# Patient Record
Sex: Male | Born: 1940 | Race: White | Hispanic: No | Marital: Married | Smoking: Former smoker
Health system: Southern US, Community
[De-identification: ages and names within clinical notes are randomized; demographics above are authoritative.]

## PROBLEM LIST (undated history)

## (undated) DIAGNOSIS — N62 Hypertrophy of breast: Secondary | ICD-10-CM

## (undated) DIAGNOSIS — K635 Polyp of colon: Secondary | ICD-10-CM

## (undated) DIAGNOSIS — N529 Male erectile dysfunction, unspecified: Secondary | ICD-10-CM

## (undated) DIAGNOSIS — F329 Major depressive disorder, single episode, unspecified: Secondary | ICD-10-CM

## (undated) DIAGNOSIS — E7800 Pure hypercholesterolemia, unspecified: Secondary | ICD-10-CM

## (undated) DIAGNOSIS — M549 Dorsalgia, unspecified: Secondary | ICD-10-CM

## (undated) DIAGNOSIS — F32A Depression, unspecified: Secondary | ICD-10-CM

## (undated) DIAGNOSIS — E0590 Thyrotoxicosis, unspecified without thyrotoxic crisis or storm: Secondary | ICD-10-CM

## (undated) DIAGNOSIS — I2510 Atherosclerotic heart disease of native coronary artery without angina pectoris: Secondary | ICD-10-CM

## (undated) DIAGNOSIS — L219 Seborrheic dermatitis, unspecified: Secondary | ICD-10-CM

## (undated) DIAGNOSIS — M48061 Spinal stenosis, lumbar region without neurogenic claudication: Secondary | ICD-10-CM

## (undated) DIAGNOSIS — I00 Rheumatic fever without heart involvement: Secondary | ICD-10-CM

## (undated) DIAGNOSIS — K429 Umbilical hernia without obstruction or gangrene: Secondary | ICD-10-CM

## (undated) HISTORY — DX: Rheumatic fever without heart involvement: I00

## (undated) HISTORY — DX: Polyp of colon: K63.5

## (undated) HISTORY — DX: Dorsalgia, unspecified: M54.9

## (undated) HISTORY — DX: Pure hypercholesterolemia, unspecified: E78.00

## (undated) HISTORY — DX: Depression, unspecified: F32.A

## (undated) HISTORY — DX: Major depressive disorder, single episode, unspecified: F32.9

## (undated) HISTORY — PX: CARDIAC CATHETERIZATION: SHX172

## (undated) HISTORY — DX: Hypertrophy of breast: N62

## (undated) HISTORY — DX: Male erectile dysfunction, unspecified: N52.9

## (undated) HISTORY — DX: Umbilical hernia without obstruction or gangrene: K42.9

## (undated) HISTORY — DX: Spinal stenosis, lumbar region without neurogenic claudication: M48.061

## (undated) HISTORY — DX: Seborrheic dermatitis, unspecified: L21.9

## (undated) HISTORY — DX: Thyrotoxicosis, unspecified without thyrotoxic crisis or storm: E05.90

## (undated) HISTORY — PX: CORONARY STENT PLACEMENT: SHX1402

---

## 2000-05-26 ENCOUNTER — Encounter: Admission: RE | Admit: 2000-05-26 | Discharge: 2000-05-26 | Payer: Self-pay | Admitting: Geriatric Medicine

## 2000-05-26 ENCOUNTER — Encounter: Payer: Self-pay | Admitting: Geriatric Medicine

## 2013-01-04 ENCOUNTER — Ambulatory Visit (HOSPITAL_COMMUNITY): Payer: Medicare HMO | Attending: Internal Medicine

## 2013-01-04 DIAGNOSIS — I10 Essential (primary) hypertension: Secondary | ICD-10-CM | POA: Insufficient documentation

## 2013-01-04 DIAGNOSIS — I2510 Atherosclerotic heart disease of native coronary artery without angina pectoris: Secondary | ICD-10-CM | POA: Insufficient documentation

## 2013-01-04 DIAGNOSIS — Z136 Encounter for screening for cardiovascular disorders: Secondary | ICD-10-CM

## 2013-01-04 DIAGNOSIS — F17200 Nicotine dependence, unspecified, uncomplicated: Secondary | ICD-10-CM | POA: Diagnosis not present

## 2013-01-26 ENCOUNTER — Other Ambulatory Visit: Payer: Self-pay | Admitting: Nurse Practitioner

## 2013-01-26 ENCOUNTER — Ambulatory Visit
Admission: RE | Admit: 2013-01-26 | Discharge: 2013-01-26 | Disposition: A | Discharge status: Home / Self Care | Payer: Commercial Managed Care - HMO | Source: Ambulatory Visit | Attending: Nurse Practitioner | Admitting: Nurse Practitioner

## 2013-01-26 DIAGNOSIS — R079 Chest pain, unspecified: Secondary | ICD-10-CM

## 2013-04-19 ENCOUNTER — Other Ambulatory Visit: Payer: Self-pay | Admitting: Internal Medicine

## 2013-04-19 DIAGNOSIS — R2689 Other abnormalities of gait and mobility: Secondary | ICD-10-CM

## 2013-04-19 DIAGNOSIS — R269 Unspecified abnormalities of gait and mobility: Secondary | ICD-10-CM

## 2013-04-25 ENCOUNTER — Other Ambulatory Visit: Payer: Medicare HMO

## 2013-05-01 ENCOUNTER — Ambulatory Visit
Admission: RE | Admit: 2013-05-01 | Discharge: 2013-05-01 | Disposition: A | Discharge status: Home / Self Care | Payer: Medicare HMO | Source: Ambulatory Visit | Attending: Internal Medicine | Admitting: Internal Medicine

## 2013-05-01 ENCOUNTER — Ambulatory Visit
Admission: RE | Admit: 2013-05-01 | Discharge: 2013-05-01 | Disposition: A | Discharge status: Home / Self Care | Payer: Commercial Managed Care - HMO | Source: Ambulatory Visit | Attending: Internal Medicine | Admitting: Internal Medicine

## 2013-05-01 DIAGNOSIS — R269 Unspecified abnormalities of gait and mobility: Secondary | ICD-10-CM

## 2013-05-01 DIAGNOSIS — R2689 Other abnormalities of gait and mobility: Secondary | ICD-10-CM

## 2013-05-01 MED ORDER — GADOBENATE DIMEGLUMINE 529 MG/ML IV SOLN
20.0000 mL | Freq: Once | INTRAVENOUS | Status: AC | PRN
  Administered 2013-05-01: 20 mL via INTRAVENOUS

## 2013-06-22 ENCOUNTER — Encounter: Payer: Self-pay | Admitting: Neurology

## 2013-06-22 ENCOUNTER — Ambulatory Visit (INDEPENDENT_AMBULATORY_CARE_PROVIDER_SITE_OTHER): Payer: Medicare HMO | Admitting: Neurology

## 2013-06-22 VITALS — BP 124/90 | HR 90 | Ht 69.69 in | Wt 236.5 lb

## 2013-06-22 DIAGNOSIS — R42 Dizziness and giddiness: Secondary | ICD-10-CM

## 2013-06-22 DIAGNOSIS — G609 Hereditary and idiopathic neuropathy, unspecified: Secondary | ICD-10-CM

## 2013-06-22 NOTE — Patient Instructions (Addendum)
1.  Your provider has requested that you have labwork completed today. Please go to [facility] on the first floor of this building before leaving the office today. We would like you to have a two hour glucose tolerance test. This test is performed at [facility] located at [facility] - basement level. Please call [phone] to arrange a convenient time to have testing completed.  2.  Start using knee high compression stocking 3.  Fall precautions discussed 4.  Encouraged to use cane 5.  Encouraged to stop smoking 6.  Return to clinic in 38-months  [facility]  Preventing Falls in the Home   Falls are common, often dreaded events in the lives of older people. Aside from the obvious injuries and even death that may result, falls can cause wide-ranging consequences including loss of independence, mental decline, decreased activity, and mobility. Younger people are also at risk of falling, especially those with chronic illnesses and fatigue.  Ways to reduce the risk for falling:  * Examine diet and medications. Warm foods and alcohol dilate blood vessels, which can lead to dizziness when standing. Sleep aids, antidepressants, and pain medications can also increase the likelihood of a fall.  * Get a vison exam. Poor vision, cataracts, and glaucoma increase the chances of falling.  * Check foot gear. Shoes should fit snugly and have a sturdy, nonskid sole and broad, low heel.  * Participate in a physician-approved exercise program to build and maintain muscle strength and improve balance and coordination.  * Increase vitamin D intake. Vitamin D improves muscle strength and increases the amount of calcium the body is able to absorb and deposit in bones.  How to prevent falls from common hazards:  * Floors - Remove all loose wires, cords, and throw rugs. Minimize clutter. Make sure rugs are anchored and smooth. Keep furniture in its usual place.  * Chairs - Use chairs with  straight backs, armrests, and firm seats. Add firm cushions to existing pieces to add height.  * Bathroom - Install grab bars and non-skid tape in the tub or shower. Use a bathtub transfer bench or a shower chair with a back support. Use an elevated toilet seat and/or safety rails to assist standing from a low surface. Do not use towel racks or bathroom tissue holders to help you stand.  * Lighting - Make sure halls, stairways, and entrances are well-lit. Install a night light in your bathroom or hallway. Make sure there is a light switch at the top and bottom of the staircase. Turn lights on if you get up in the middle of the night. Make sure lamps or light switches are within reach of the bed if you have to get up during the night.  * Kitchen - Install non-skid rubber mats near the sink and stove. Clean spills immediately. Store frequently used utensils, pots, and pans between waist and eye level. This helps prevent reaching and bending. Sit when getting things out of the lower cupboards.  * Living room / Bedrooms - Place furniture with wide spaces in between, giving enough room to move around. Establish a route through the living room that gives you something to hold onto as you walk.  * Stairs - Make sure treads, rails, and rugs are secure. Install a rail on both sides of the stairs. If stairs are a threat, it might be helpful to arrange most of your activities on the lower level to reduce the number of  times you must climb the stairs.  * Entrances and doorways - Install metal handles on the walls adjacent to the doorknobs of all doors to make it more secure as you travel through the doorway.  Tips for maintaining balance:  * Keep at least one hand free at all times Try using a backpack or fanny pack to hold things rather than carrying them in your hands. Never carry objects in both hands when walking as this interferes with keeping your balance.  * Attempt to swing both arms from front to back while  walking. This might require a conscious effort if Parkinson's disease has diminished your movement. It will, however, help you to maintain balance and posture, and reduce fatigue.  * Consciously lift your feet off the ground when walking. Shuffling and dragging of the feet is a common culprit in losing your balance.  * When trying to navigate turns, use a "U" technique of facing forward and making a wide turn, rather than pivoting sharply.  * Try to stand with your feet shoulder-length apart. When your feet are close together for any length of time, you increase your risk of losing your balance and falling.  * Do one thing at a time. Do not try to walk and accomplish another task, such as reading or looking around. The decrease in your automatic reflexes complicates motor function, so the less distraction, the better.  * Do not wear rubber or gripping soled shoes, they might "catch" on the floor and cause tripping.  * Move slowly when changing positions. Use deliberate, concentrated movements and, if needed, use a grab bar or walking aid. Count fifteen (15) seconds after standing to begin walking.  * If balance is a continuous problem, you might want to consider a walking aid such as a cane, walking stick, or walker. Once you have mastered walking with help, you may be ready to try it again on your own.  This information is provided by [facility] and is not intended to replace the medical advice of your physician or other health care providers. Please consult your physician or other health care providers for advice regarding your specific medical condition.

## 2013-06-22 NOTE — Progress Notes (Signed)
[facility] Clinic Note - Initial Visit   Date: 06/22/2013    Jordan Hebert MRN: [id number] DOB: [birth year]   Dear Dr Valentina Lucks:  Thank you for your kind referral of Jordan Hebert for consultation of gait abnormality. Although his history is well known to you, please allow [place] to reiterate it for the purpose of our medical record. The patient was accompanied to the clinic by self.    History of Present Illness: Jordan Hebert is a 73 y.o. right-handed Caucasian male with history of hypothyroidism, CAD s/p PCI/stent (2002) and hyperlipidemia presenting for evaluation of gait abnormality.  Starting around fall 2014, he developed constant numbness involving the soles of the feet and had two falls: (1) [place] and tripped and bruised the left side of his abdomen/chest and (2) occurred in his daughter's yard on grass.  He was able to get up himself with each fall.  He denies any preceding lightheadedness, changes in vision, or palpitations. He denies any weakness or tingling.  He is walking unassisted. If he gets up too quickly out of bed, from a chair, or makes any abrupt movements, he develops lightheadedness.  Denies dry eyes, early satiety, or lack of sweating.  He has mild dry mouth.   He saw his PCP, Dr. Valentina Lucks, who ordered MRI/A brain which was normal and referred him for further evaluation.   He tells me that his son died of Guillain-Barr syndrome at the age of 44 and 2012. He was hospitalized in [place] for 3 months, returned home and was doing outpatient therapy, and the first day after he started walking he died. He suspects it was due to a blood clot. His sister also died of a ruptured cerebral aneurysm.  Out-side paper records, electronic medical record, and images have been reviewed where available and summarized as:  Labs 05/07/13: B12 515, Cr 1.02  Past Medical History  Diagnosis Date  . Hyperthyroidism   . High  cholesterol   . Gynecomastia   . Back pain   . Erectile dysfunction   . Depression   . Rheumatic fever   . Umbilical hernia   . Seborrhea   . Colon polyps   . Degenerative lumbar spinal stenosis     Past Surgical History  Procedure Laterality Date  . Coronary stent placement       Medications:  Aspirin 81 mg daily Lipitor 40 mg daily Flexeril 10 mg as needed Synthroid 175 mcg daily Vitamins B12 1000 mcg daily  Allergies: No Known Allergies  Family History: Family History  Problem Relation Age of Onset  . Stroke Mother   . Cancer Brother   . Aneurysm Sister     Cerebral   . Other Son     Guillain-Barre Syndrome    Social History: History   Social History  . Marital Status: Married    Spouse Name: N/A    Number of Children: 2  . Years of Education: N/A   Occupational History  . retired    Social History Main Topics  . Smoking status: Current Every Day Smoker  . Smokeless tobacco: Never Used  . Alcohol Use: Yes  . Drug Use: Not on file  . Sexual Activity: Not on file   Other Topics Concern  . Not on file   Social History Narrative   He lives with wife.    He is retired from [employer] applications)  Review of Systems:  CONSTITUTIONAL: No fevers, chills, night sweats, or weight loss.   EYES: No visual changes or eye pain ENT: No hearing changes.  No history of nose bleeds.   RESPIRATORY: No cough, wheezing and shortness of breath.   CARDIOVASCULAR: Negative for chest pain, and palpitations.   GI: Negative for abdominal discomfort, blood in stools or black stools.  No recent change in bowel habits.   GU:  No history of incontinence.   MUSCLOSKELETAL: No history of joint pain or swelling.  No myalgias.   SKIN: Negative for lesions, rash, and itching.   HEMATOLOGY/ONCOLOGY: Negative for prolonged bleeding, bruising easily, and swollen nodes.  No history of cancer.   ENDOCRINE: Negative for cold or heat intolerance, polydipsia  or goiter.   PSYCH:  No depression or anxiety symptoms.   NEURO: As Above.   Vital Signs:  BP 124/90  Pulse 90  Ht 5' 9.69" (1.77 m)  Wt 236 lb 8 oz (107.276 kg)  BMI 34.24 kg/m2  SpO2 94%  Neurological Exam: MENTAL STATUS including orientation to time, place, person, recent and remote memory, attention span and concentration, language, and fund of knowledge is normal.  Speech is not dysarthric.  CRANIAL NERVES: II:  No visual field defects.  Unremarkable fundi.   III-IV-VI: Pupils equal round and reactive to light.  Normal conjugate, extra-ocular eye movements in all directions of gaze.  No nystagmus.  No ptosis.   V:  Normal facial sensation.   VII:  Normal facial symmetry and movements.  No pathologic facial reflexes.  VIII:  Normal hearing and vestibular function.   IX-X:  Normal palatal movement.   XI:  Normal shoulder shrug and head rotation.   XII:  Normal tongue strength and range of motion, no deviation or fasciculation.  MOTOR:  No atrophy, fasciculations or abnormal movements.  No pronator drift.  Tone is normal.    Right Upper Extremity:    Left Upper Extremity:    Deltoid  5/5   Deltoid  5/5   Biceps  5/5   Biceps  5/5   Triceps  5/5   Triceps  5/5   Wrist extensors  5/5   Wrist extensors  5/5   Wrist flexors  5/5   Wrist flexors  5/5   Finger extensors  5/5   Finger extensors  5/5   Finger flexors  5/5   Finger flexors  5/5   Dorsal interossei  5/5   Dorsal interossei  5/5   Abductor pollicis  5/5   Abductor pollicis  5/5   Tone (Ashworth scale)  0  Tone (Ashworth scale)  0   Right Lower Extremity:    Left Lower Extremity:    Hip flexors  5/5   Hip flexors  5/5   Hip extensors  5/5   Hip extensors  5/5   Knee flexors  5/5   Knee flexors  5/5   Knee extensors  5/5   Knee extensors  5/5   Dorsiflexors  5/5   Dorsiflexors  5/5   Plantarflexors  5/5   Plantarflexors  5/5   Toe extensors  5/5   Toe extensors  5/5   Toe flexors  5/5   Toe flexors  5/5   Tone  (Ashworth scale)  0  Tone (Ashworth scale)  0   MSRs:  Right  Left brachioradialis 2+  brachioradialis 2+  biceps 2+  biceps 2+  triceps 2+  triceps 2+  patellar 3+  patellar 3+  ankle jerk 0  ankle jerk 0  Hoffman no  Hoffman no  plantar response down  plantar response down   SENSORY:  Vibration reduced to 60% at knees, 30% at ankles and great toe bilaterally.  Temperature and pin prick reduced distal to mid-calf bilaterally.  Romberg's sign present.   COORDINATION/GAIT: Normal finger-to- nose-finger and heel-to-shin.  Intact rapid alternating movements bilaterally.  Able to rise from a chair without using arms.  Slightly wide-based unsteady gait.  He is unable to tandem and stressed gait intact.    IMPRESSION: Jordan Hebert is a 73 year-old gentleman presenting for evaluation of gait abnormality and numbness involving the soles of his feet. 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   I will check for potentially treatable causes of neuropathy. His B12 and thyroid function is within normal limits. I have discussed the pathophysiology, etiology, management options, and natural course of neuropathy with the patient. I offered him the opportunity to ask questions and answered them to the best of my ability. I discussed doing balance training and gait therapy, however patient would like to try compression stockings first. No need for EMG given typical history and exam findings.   PLAN/RECOMMENDATIONS:  1.  Check 2hr glucose tolerance, copper, SPEP/UPEP with IFE 2.  Start using knee high compression stocking 30mmHg 3.  US carotids  4.  Fall precautions discussed, specifically making positional changes very slowly and steadily. 5.  Encouraged to use cane 6.   Return to clinic in 213-months   The duration of this appointment visit was 50 minutes of face-to-face time with the patient.  Greater than 50% of this time was spent in counseling, explanation of diagnosis, planning of further management, and coordination of care.   Thank you for allowing me to participate in patient's care.  If I can answer any additional questions, I would be pleased to do so.    Sincerely,    Donika K. Allena KatzPatel, DO

## 2013-06-25 ENCOUNTER — Other Ambulatory Visit: Payer: Self-pay | Admitting: *Deleted

## 2013-06-25 DIAGNOSIS — R269 Unspecified abnormalities of gait and mobility: Secondary | ICD-10-CM

## 2013-06-25 LAB — COPPER, SERUM: Copper: 98 ug/dL (ref 70–175)

## 2013-06-25 NOTE — Progress Notes (Signed)
Faxed note and LM for pt to call me about appt time and date.

## 2013-06-26 LAB — UIFE/LIGHT CHAINS/TP QN, 24-HR UR
Albumin, U: DETECTED
FREE LAMBDA LT CHAINS, UR: 0.12 mg/dL (ref 0.02–0.67)
Free Kappa Lt Chains,Ur: 1.22 mg/dL (ref 0.14–2.42)
Free Kappa/Lambda Ratio: 10.17 ratio (ref 2.04–10.37)
Total Protein, Urine: 1.9 mg/dL

## 2013-06-26 LAB — SPEP & IFE WITH QIG
Albumin ELP: 53.8 % — ABNORMAL LOW (ref 55.8–66.1)
Alpha-1-Globulin: 3.5 % (ref 2.9–4.9)
Alpha-2-Globulin: 12.4 % — ABNORMAL HIGH (ref 7.1–11.8)
Beta 2: 5.6 % (ref 3.2–6.5)
Beta Globulin: 6.7 % (ref 4.7–7.2)
GAMMA GLOBULIN: 18 % (ref 11.1–18.8)
IGG (IMMUNOGLOBIN G), SERUM: 1270 mg/dL (ref 650–1600)
IGM, SERUM: 49 mg/dL (ref 41–251)
IgA: 281 mg/dL (ref 68–379)
TOTAL PROTEIN, SERUM ELECTROPHOR: 7.2 g/dL (ref 6.0–8.3)

## 2013-06-26 NOTE — Progress Notes (Signed)
Pt notified of appt time and date.  April 2 at 11:00.

## 2013-06-27 ENCOUNTER — Other Ambulatory Visit: Payer: Self-pay

## 2013-06-27 DIAGNOSIS — R079 Chest pain, unspecified: Secondary | ICD-10-CM

## 2013-06-28 ENCOUNTER — Ambulatory Visit (HOSPITAL_COMMUNITY)
Admission: RE | Admit: 2013-06-28 | Discharge: 2013-06-28 | Disposition: A | Discharge status: Home / Self Care | Payer: Medicare HMO | Source: Ambulatory Visit | Attending: Neurology | Admitting: Neurology

## 2013-06-28 DIAGNOSIS — R269 Unspecified abnormalities of gait and mobility: Secondary | ICD-10-CM | POA: Insufficient documentation

## 2013-06-28 DIAGNOSIS — E785 Hyperlipidemia, unspecified: Secondary | ICD-10-CM | POA: Diagnosis not present

## 2013-06-28 DIAGNOSIS — F17200 Nicotine dependence, unspecified, uncomplicated: Secondary | ICD-10-CM | POA: Diagnosis not present

## 2013-06-28 NOTE — Progress Notes (Signed)
VASCULAR LAB PRELIMINARY  PRELIMINARY  PRELIMINARY  PRELIMINARY  Carotid duplex completed.    Preliminary report:  Bilateral:  1-39% ICA stenosis.  Vertebral artery flow is antegrade.     Claron Rosencrans, RVS 06/28/2013, 3:44 PM

## 2013-07-20 ENCOUNTER — Telehealth: Payer: Self-pay | Admitting: Neurology

## 2013-07-20 ENCOUNTER — Other Ambulatory Visit: Payer: Self-pay | Admitting: *Deleted

## 2013-07-20 DIAGNOSIS — G609 Hereditary and idiopathic neuropathy, unspecified: Secondary | ICD-10-CM

## 2013-07-20 NOTE — Telephone Encounter (Signed)
LM for patient to call me back. 

## 2013-07-20 NOTE — Telephone Encounter (Signed)
Patient notified that his lab is on May 26 at 7:30 am.  Instructed him to be fasting for 8 hours prior.  Patient agreed.

## 2013-07-20 NOTE — Telephone Encounter (Signed)
[phone] needs to know where to go to take a diabetic test

## 2013-08-21 ENCOUNTER — Other Ambulatory Visit: Payer: Commercial Managed Care - HMO

## 2013-08-21 DIAGNOSIS — G609 Hereditary and idiopathic neuropathy, unspecified: Secondary | ICD-10-CM

## 2013-08-21 LAB — GLUCOSE TOLERANCE, 2 HOURS
GLUCOSE 1 HOUR GTT: 213 mg/dL
GLUCOSE, FASTING: 128 mg/dL — AB (ref 70–99)
Glucose, 2 hour: 144 mg/dL

## 2013-09-25 ENCOUNTER — Ambulatory Visit: Payer: Medicare HMO | Admitting: Neurology

## 2013-09-26 ENCOUNTER — Telehealth: Payer: Self-pay | Admitting: Neurology

## 2013-09-26 NOTE — Telephone Encounter (Signed)
Pt no showed 09/25/13 3 month follow up appt w/ Dr. Allena KatzPatel. No show letter mailed to pt / Sherri S.

## 2013-12-12 ENCOUNTER — Ambulatory Visit
Admission: RE | Admit: 2013-12-12 | Discharge: 2013-12-12 | Disposition: A | Discharge status: Home / Self Care | Payer: Commercial Managed Care - HMO | Source: Ambulatory Visit | Attending: Internal Medicine | Admitting: Internal Medicine

## 2013-12-12 ENCOUNTER — Other Ambulatory Visit: Payer: Self-pay | Admitting: Internal Medicine

## 2013-12-12 DIAGNOSIS — M5489 Other dorsalgia: Secondary | ICD-10-CM

## 2014-09-20 ENCOUNTER — Other Ambulatory Visit: Payer: Self-pay | Admitting: *Deleted

## 2014-09-20 DIAGNOSIS — R200 Anesthesia of skin: Secondary | ICD-10-CM

## 2014-10-08 ENCOUNTER — Ambulatory Visit (INDEPENDENT_AMBULATORY_CARE_PROVIDER_SITE_OTHER): Payer: Commercial Managed Care - HMO | Admitting: Neurology

## 2014-10-08 DIAGNOSIS — G609 Hereditary and idiopathic neuropathy, unspecified: Secondary | ICD-10-CM

## 2014-10-08 DIAGNOSIS — R200 Anesthesia of skin: Secondary | ICD-10-CM

## 2014-10-08 NOTE — Procedures (Signed)
[facility]  [address], [address]  [place], [place] [address] Tel: [phone] Fax:  [phone] Test Date:  10/08/2014  Patient: Jordan DingwallRobert Hebert DOB: 01/27/1941 Physician: Nita Sickleonika Sonya Pucci, DO  Sex: Male Height: 5\' 10"  Ref Phys: Thora LanceJohn J.Griffin, M.D.  ID#: [id number] Temp: 33.6C Technician: Judie PetitM. Dean   Patient Complaints: This is a [age] year old gentleman presenting for evaluation of bilateral feet paresthesias.   NCV & EMG Findings: Extensive electrodiagnostic testing of the right lower extremity and additional studies of the left shows: 1. Bilateral sural and superficial peroneal sensory responses are absent. 2. Bilateral tibial and motor responses are within normal limits. Of note, proximal tibial motor response on the left is reduced and most likely technical in nature. 3. Chronic motor axon loss changes are seen in the muscles below the knees bilaterally, without accompanied active denervation.  Impression: 1. 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 Overall, these findings are moderate in degree electrically. 2. There is no evidence of a superimposed lumbosacral radiculopathy affecting the legs.   ___________________________ Nita Sickleonika [name], DO    Nerve Conduction Studies Anti Sensory Summary Table   Site NR Peak (ms) Norm Peak (ms) P-T Amp (V) Norm P-T Amp  Left Sup Peroneal Anti Sensory (Ant Lat Mall)  12 cm NR  <4.6  >3  Right Sup Peroneal Anti Sensory (Ant Lat Mall)  12 cm NR  <4.6  >3  Left Sural Anti Sensory (Lat Mall)  Calf NR  <4.6  >3  Right Sural Anti Sensory (Lat Mall)  Calf NR  <4.6  >3   Motor Summary Table   Site NR Onset (ms) Norm Onset (ms) O-P Amp (mV) Norm O-P Amp Site1 Site2 Delta-0 (ms) Dist (cm) Vel (m/s) Norm Vel (m/s)  Left Peroneal Motor (Ext Dig Brev)  Ankle    3.4 <6.0 3.2 >2.5 B Fib Ankle 7.9 32.0 41 >40  B Fib    11.3  2.8  Poplt B Fib 2.5  10.0 40 >40  Poplt    13.8  2.9         Right Peroneal Motor (Ext Dig Brev)  Ankle    3.4 <6.0 2.6 >2.5 B Fib Ankle 7.7 34.0 44 >40  B Fib    11.1  2.4  Poplt B Fib 2.3 10.0 43 >40  Poplt    13.4  2.4         Left Tibial Motor (Abd Hall Brev)    Multiple attempts  Ankle    3.9 <6.0 4.9 >4 Knee Ankle 9.9 41.0 41 >40  Knee    13.8  1.8         Right Tibial Motor (Abd Hall Brev)  Ankle    3.5 <6.0 5.0 >4 Knee Ankle 10.2 44.0 43 >40  Knee    13.7  4.0          EMG   Side Muscle Ins Act Fibs Psw Fasc Number Recrt Dur Dur. Amp Amp. Poly Poly. Comment  Right AntTibialis Nml Nml Nml Nml 1- Mod-R Some 1+ Some 1+ Nml Nml N/A  Right Gastroc Nml Nml Nml Nml 1- Mod-R Some 1+ Some 1+ Nml Nml N/A  Right Flex Dig Long Nml Nml Nml Nml 1- Mod-R Some 1+ Some 1+ Nml Nml N/A  Right RectFemoris Nml Nml Nml Nml Nml Nml Nml Nml Nml Nml Nml Nml N/A  Right GluteusMed Nml Nml Nml Nml Nml Nml Nml Nml Nml Nml Nml  Nml N/A  Left AntTibialis Nml Nml Nml Nml 1- Mod-R Some 1+ Some 1+ Nml Nml N/A  Left Gastroc Nml Nml Nml Nml 1- Mod-R Some 1+ Some 1+ Nml Nml N/A  Left RectFemoris Nml Nml Nml Nml Nml Nml Nml Nml Nml Nml Nml Nml N/A  Left BicepsFemS Nml Nml Nml Nml Nml Nml Nml Nml Nml Nml Nml Nml N/A      Waveforms:

## 2014-10-21 ENCOUNTER — Other Ambulatory Visit: Payer: Self-pay | Admitting: Internal Medicine

## 2014-10-21 DIAGNOSIS — M5412 Radiculopathy, cervical region: Secondary | ICD-10-CM

## 2014-10-23 ENCOUNTER — Ambulatory Visit
Admission: RE | Admit: 2014-10-23 | Discharge: 2014-10-23 | Disposition: A | Discharge status: Home / Self Care | Payer: Commercial Managed Care - HMO | Source: Ambulatory Visit | Attending: Internal Medicine | Admitting: Internal Medicine

## 2014-10-23 DIAGNOSIS — M5412 Radiculopathy, cervical region: Secondary | ICD-10-CM

## 2014-12-31 ENCOUNTER — Other Ambulatory Visit: Payer: Self-pay | Admitting: Internal Medicine

## 2014-12-31 DIAGNOSIS — M5136 Other intervertebral disc degeneration, lumbar region: Secondary | ICD-10-CM

## 2014-12-31 DIAGNOSIS — M545 Low back pain: Secondary | ICD-10-CM

## 2015-01-14 ENCOUNTER — Ambulatory Visit
Admission: RE | Admit: 2015-01-14 | Discharge: 2015-01-14 | Disposition: A | Discharge status: Home / Self Care | Payer: Commercial Managed Care - HMO | Source: Ambulatory Visit | Attending: Internal Medicine | Admitting: Internal Medicine

## 2015-01-14 DIAGNOSIS — M545 Low back pain: Secondary | ICD-10-CM

## 2015-01-14 DIAGNOSIS — M5136 Other intervertebral disc degeneration, lumbar region: Secondary | ICD-10-CM

## 2015-02-06 ENCOUNTER — Other Ambulatory Visit: Payer: Self-pay | Admitting: Neurosurgery

## 2015-02-06 DIAGNOSIS — M4726 Other spondylosis with radiculopathy, lumbar region: Secondary | ICD-10-CM

## 2015-02-12 ENCOUNTER — Ambulatory Visit
Admission: RE | Admit: 2015-02-12 | Discharge: 2015-02-12 | Disposition: A | Discharge status: Home / Self Care | Payer: Commercial Managed Care - HMO | Source: Ambulatory Visit | Attending: Neurosurgery | Admitting: Neurosurgery

## 2015-02-12 DIAGNOSIS — M4726 Other spondylosis with radiculopathy, lumbar region: Secondary | ICD-10-CM

## 2015-02-12 MED ORDER — IOHEXOL 180 MG/ML  SOLN
1.0000 mL | Freq: Once | INTRAMUSCULAR | Status: DC | PRN
Start: 2015-02-12 — End: 2015-02-13
  Administered 2015-02-12: 1 mL via EPIDURAL

## 2015-02-12 MED ORDER — METHYLPREDNISOLONE ACETATE 40 MG/ML INJ SUSP (RADIOLOG
120.0000 mg | Freq: Once | INTRAMUSCULAR | Status: AC
  Administered 2015-02-12: 120 mg via EPIDURAL

## 2015-02-12 NOTE — Discharge Instructions (Signed)

## 2015-03-21 ENCOUNTER — Other Ambulatory Visit: Payer: Self-pay | Admitting: Neurosurgery

## 2015-03-21 DIAGNOSIS — M4726 Other spondylosis with radiculopathy, lumbar region: Secondary | ICD-10-CM

## 2015-04-01 ENCOUNTER — Ambulatory Visit
Admission: RE | Admit: 2015-04-01 | Discharge: 2015-04-01 | Disposition: A | Discharge status: Home / Self Care | Payer: Commercial Managed Care - HMO | Source: Ambulatory Visit | Attending: Neurosurgery | Admitting: Neurosurgery

## 2015-04-01 DIAGNOSIS — M4726 Other spondylosis with radiculopathy, lumbar region: Secondary | ICD-10-CM

## 2015-04-01 MED ORDER — METHYLPREDNISOLONE ACETATE 40 MG/ML INJ SUSP (RADIOLOG
120.0000 mg | Freq: Once | INTRAMUSCULAR | Status: AC
  Administered 2015-04-01: 120 mg via EPIDURAL

## 2015-04-01 MED ORDER — IOHEXOL 180 MG/ML  SOLN
1.0000 mL | Freq: Once | INTRAMUSCULAR | Status: AC | PRN
  Administered 2015-04-01: 1 mL via EPIDURAL

## 2015-05-19 ENCOUNTER — Other Ambulatory Visit: Payer: Self-pay | Admitting: Neurosurgery

## 2015-05-19 DIAGNOSIS — M4726 Other spondylosis with radiculopathy, lumbar region: Secondary | ICD-10-CM

## 2015-05-22 ENCOUNTER — Ambulatory Visit
Admission: RE | Admit: 2015-05-22 | Discharge: 2015-05-22 | Disposition: A | Discharge status: Home / Self Care | Payer: Commercial Managed Care - HMO | Source: Ambulatory Visit | Attending: Neurosurgery | Admitting: Neurosurgery

## 2015-05-22 ENCOUNTER — Other Ambulatory Visit: Payer: Self-pay | Admitting: Neurosurgery

## 2015-05-22 DIAGNOSIS — M4726 Other spondylosis with radiculopathy, lumbar region: Secondary | ICD-10-CM

## 2015-05-22 MED ORDER — METHYLPREDNISOLONE ACETATE 40 MG/ML INJ SUSP (RADIOLOG
120.0000 mg | Freq: Once | INTRAMUSCULAR | Status: AC
  Administered 2015-05-22: 120 mg via EPIDURAL

## 2015-05-22 MED ORDER — IOHEXOL 180 MG/ML  SOLN
1.0000 mL | Freq: Once | INTRAMUSCULAR | Status: AC | PRN
  Administered 2015-05-22: 1 mL via EPIDURAL

## 2016-04-30 DIAGNOSIS — Z7189 Other specified counseling: Secondary | ICD-10-CM | POA: Diagnosis not present

## 2016-04-30 DIAGNOSIS — M545 Low back pain: Secondary | ICD-10-CM | POA: Diagnosis not present

## 2016-04-30 DIAGNOSIS — E782 Mixed hyperlipidemia: Secondary | ICD-10-CM | POA: Diagnosis not present

## 2016-04-30 DIAGNOSIS — R7301 Impaired fasting glucose: Secondary | ICD-10-CM | POA: Diagnosis not present

## 2016-04-30 DIAGNOSIS — H6123 Impacted cerumen, bilateral: Secondary | ICD-10-CM | POA: Diagnosis not present

## 2016-04-30 DIAGNOSIS — E039 Hypothyroidism, unspecified: Secondary | ICD-10-CM | POA: Diagnosis not present

## 2016-04-30 DIAGNOSIS — G629 Polyneuropathy, unspecified: Secondary | ICD-10-CM | POA: Diagnosis not present

## 2016-04-30 DIAGNOSIS — Z0000 Encounter for general adult medical examination without abnormal findings: Secondary | ICD-10-CM | POA: Diagnosis not present

## 2016-04-30 DIAGNOSIS — I2510 Atherosclerotic heart disease of native coronary artery without angina pectoris: Secondary | ICD-10-CM | POA: Diagnosis not present

## 2016-04-30 DIAGNOSIS — M5136 Other intervertebral disc degeneration, lumbar region: Secondary | ICD-10-CM | POA: Diagnosis not present

## 2016-04-30 DIAGNOSIS — Z1389 Encounter for screening for other disorder: Secondary | ICD-10-CM | POA: Diagnosis not present

## 2016-05-06 DIAGNOSIS — E782 Mixed hyperlipidemia: Secondary | ICD-10-CM | POA: Diagnosis not present

## 2016-05-06 DIAGNOSIS — I2510 Atherosclerotic heart disease of native coronary artery without angina pectoris: Secondary | ICD-10-CM | POA: Diagnosis not present

## 2016-05-06 DIAGNOSIS — R7301 Impaired fasting glucose: Secondary | ICD-10-CM | POA: Diagnosis not present

## 2016-05-06 DIAGNOSIS — E039 Hypothyroidism, unspecified: Secondary | ICD-10-CM | POA: Diagnosis not present

## 2016-09-21 DIAGNOSIS — M9902 Segmental and somatic dysfunction of thoracic region: Secondary | ICD-10-CM | POA: Diagnosis not present

## 2016-09-21 DIAGNOSIS — M9903 Segmental and somatic dysfunction of lumbar region: Secondary | ICD-10-CM | POA: Diagnosis not present

## 2016-09-21 DIAGNOSIS — M9901 Segmental and somatic dysfunction of cervical region: Secondary | ICD-10-CM | POA: Diagnosis not present

## 2016-09-21 DIAGNOSIS — M542 Cervicalgia: Secondary | ICD-10-CM | POA: Diagnosis not present

## 2016-09-21 DIAGNOSIS — M546 Pain in thoracic spine: Secondary | ICD-10-CM | POA: Diagnosis not present

## 2016-09-21 DIAGNOSIS — M545 Low back pain: Secondary | ICD-10-CM | POA: Diagnosis not present

## 2016-09-24 DIAGNOSIS — M9902 Segmental and somatic dysfunction of thoracic region: Secondary | ICD-10-CM | POA: Diagnosis not present

## 2016-09-24 DIAGNOSIS — M545 Low back pain: Secondary | ICD-10-CM | POA: Diagnosis not present

## 2016-09-24 DIAGNOSIS — M546 Pain in thoracic spine: Secondary | ICD-10-CM | POA: Diagnosis not present

## 2016-09-24 DIAGNOSIS — M542 Cervicalgia: Secondary | ICD-10-CM | POA: Diagnosis not present

## 2016-09-24 DIAGNOSIS — M9901 Segmental and somatic dysfunction of cervical region: Secondary | ICD-10-CM | POA: Diagnosis not present

## 2016-09-24 DIAGNOSIS — M9903 Segmental and somatic dysfunction of lumbar region: Secondary | ICD-10-CM | POA: Diagnosis not present

## 2016-09-30 DIAGNOSIS — M546 Pain in thoracic spine: Secondary | ICD-10-CM | POA: Diagnosis not present

## 2016-09-30 DIAGNOSIS — M9901 Segmental and somatic dysfunction of cervical region: Secondary | ICD-10-CM | POA: Diagnosis not present

## 2016-09-30 DIAGNOSIS — M9903 Segmental and somatic dysfunction of lumbar region: Secondary | ICD-10-CM | POA: Diagnosis not present

## 2016-09-30 DIAGNOSIS — M9902 Segmental and somatic dysfunction of thoracic region: Secondary | ICD-10-CM | POA: Diagnosis not present

## 2016-09-30 DIAGNOSIS — M545 Low back pain: Secondary | ICD-10-CM | POA: Diagnosis not present

## 2016-09-30 DIAGNOSIS — M542 Cervicalgia: Secondary | ICD-10-CM | POA: Diagnosis not present

## 2016-10-11 DIAGNOSIS — M542 Cervicalgia: Secondary | ICD-10-CM | POA: Diagnosis not present

## 2016-10-11 DIAGNOSIS — M9903 Segmental and somatic dysfunction of lumbar region: Secondary | ICD-10-CM | POA: Diagnosis not present

## 2016-10-11 DIAGNOSIS — M546 Pain in thoracic spine: Secondary | ICD-10-CM | POA: Diagnosis not present

## 2016-10-11 DIAGNOSIS — M9902 Segmental and somatic dysfunction of thoracic region: Secondary | ICD-10-CM | POA: Diagnosis not present

## 2016-10-11 DIAGNOSIS — M545 Low back pain: Secondary | ICD-10-CM | POA: Diagnosis not present

## 2016-10-11 DIAGNOSIS — M9901 Segmental and somatic dysfunction of cervical region: Secondary | ICD-10-CM | POA: Diagnosis not present

## 2016-10-14 DIAGNOSIS — M542 Cervicalgia: Secondary | ICD-10-CM | POA: Diagnosis not present

## 2016-10-14 DIAGNOSIS — M9903 Segmental and somatic dysfunction of lumbar region: Secondary | ICD-10-CM | POA: Diagnosis not present

## 2016-10-14 DIAGNOSIS — M9901 Segmental and somatic dysfunction of cervical region: Secondary | ICD-10-CM | POA: Diagnosis not present

## 2016-10-14 DIAGNOSIS — M9902 Segmental and somatic dysfunction of thoracic region: Secondary | ICD-10-CM | POA: Diagnosis not present

## 2016-10-14 DIAGNOSIS — M546 Pain in thoracic spine: Secondary | ICD-10-CM | POA: Diagnosis not present

## 2016-10-14 DIAGNOSIS — M545 Low back pain: Secondary | ICD-10-CM | POA: Diagnosis not present

## 2016-10-18 DIAGNOSIS — M546 Pain in thoracic spine: Secondary | ICD-10-CM | POA: Diagnosis not present

## 2016-10-18 DIAGNOSIS — M542 Cervicalgia: Secondary | ICD-10-CM | POA: Diagnosis not present

## 2016-10-18 DIAGNOSIS — M9902 Segmental and somatic dysfunction of thoracic region: Secondary | ICD-10-CM | POA: Diagnosis not present

## 2016-10-18 DIAGNOSIS — M545 Low back pain: Secondary | ICD-10-CM | POA: Diagnosis not present

## 2016-10-18 DIAGNOSIS — M9901 Segmental and somatic dysfunction of cervical region: Secondary | ICD-10-CM | POA: Diagnosis not present

## 2016-10-18 DIAGNOSIS — M9903 Segmental and somatic dysfunction of lumbar region: Secondary | ICD-10-CM | POA: Diagnosis not present

## 2016-10-21 DIAGNOSIS — M546 Pain in thoracic spine: Secondary | ICD-10-CM | POA: Diagnosis not present

## 2016-10-21 DIAGNOSIS — M9903 Segmental and somatic dysfunction of lumbar region: Secondary | ICD-10-CM | POA: Diagnosis not present

## 2016-10-21 DIAGNOSIS — M545 Low back pain: Secondary | ICD-10-CM | POA: Diagnosis not present

## 2016-10-21 DIAGNOSIS — M542 Cervicalgia: Secondary | ICD-10-CM | POA: Diagnosis not present

## 2016-10-21 DIAGNOSIS — M9901 Segmental and somatic dysfunction of cervical region: Secondary | ICD-10-CM | POA: Diagnosis not present

## 2016-10-21 DIAGNOSIS — M9902 Segmental and somatic dysfunction of thoracic region: Secondary | ICD-10-CM | POA: Diagnosis not present

## 2016-10-25 DIAGNOSIS — H6123 Impacted cerumen, bilateral: Secondary | ICD-10-CM | POA: Diagnosis not present

## 2016-10-25 DIAGNOSIS — J029 Acute pharyngitis, unspecified: Secondary | ICD-10-CM | POA: Diagnosis not present

## 2016-10-28 DIAGNOSIS — M545 Low back pain: Secondary | ICD-10-CM | POA: Diagnosis not present

## 2016-10-28 DIAGNOSIS — M9902 Segmental and somatic dysfunction of thoracic region: Secondary | ICD-10-CM | POA: Diagnosis not present

## 2016-10-28 DIAGNOSIS — M546 Pain in thoracic spine: Secondary | ICD-10-CM | POA: Diagnosis not present

## 2016-10-28 DIAGNOSIS — M542 Cervicalgia: Secondary | ICD-10-CM | POA: Diagnosis not present

## 2016-10-28 DIAGNOSIS — M9901 Segmental and somatic dysfunction of cervical region: Secondary | ICD-10-CM | POA: Diagnosis not present

## 2016-10-28 DIAGNOSIS — M9903 Segmental and somatic dysfunction of lumbar region: Secondary | ICD-10-CM | POA: Diagnosis not present

## 2016-11-04 DIAGNOSIS — M542 Cervicalgia: Secondary | ICD-10-CM | POA: Diagnosis not present

## 2016-11-04 DIAGNOSIS — M9903 Segmental and somatic dysfunction of lumbar region: Secondary | ICD-10-CM | POA: Diagnosis not present

## 2016-11-04 DIAGNOSIS — M9902 Segmental and somatic dysfunction of thoracic region: Secondary | ICD-10-CM | POA: Diagnosis not present

## 2016-11-04 DIAGNOSIS — M545 Low back pain: Secondary | ICD-10-CM | POA: Diagnosis not present

## 2016-11-04 DIAGNOSIS — M546 Pain in thoracic spine: Secondary | ICD-10-CM | POA: Diagnosis not present

## 2016-11-04 DIAGNOSIS — M9901 Segmental and somatic dysfunction of cervical region: Secondary | ICD-10-CM | POA: Diagnosis not present

## 2016-11-11 DIAGNOSIS — M9903 Segmental and somatic dysfunction of lumbar region: Secondary | ICD-10-CM | POA: Diagnosis not present

## 2016-11-11 DIAGNOSIS — M9901 Segmental and somatic dysfunction of cervical region: Secondary | ICD-10-CM | POA: Diagnosis not present

## 2016-11-11 DIAGNOSIS — M545 Low back pain: Secondary | ICD-10-CM | POA: Diagnosis not present

## 2016-11-11 DIAGNOSIS — M546 Pain in thoracic spine: Secondary | ICD-10-CM | POA: Diagnosis not present

## 2016-11-11 DIAGNOSIS — M9902 Segmental and somatic dysfunction of thoracic region: Secondary | ICD-10-CM | POA: Diagnosis not present

## 2016-11-11 DIAGNOSIS — M542 Cervicalgia: Secondary | ICD-10-CM | POA: Diagnosis not present

## 2016-11-18 DIAGNOSIS — M9903 Segmental and somatic dysfunction of lumbar region: Secondary | ICD-10-CM | POA: Diagnosis not present

## 2016-11-18 DIAGNOSIS — M545 Low back pain: Secondary | ICD-10-CM | POA: Diagnosis not present

## 2016-11-18 DIAGNOSIS — M9902 Segmental and somatic dysfunction of thoracic region: Secondary | ICD-10-CM | POA: Diagnosis not present

## 2016-11-18 DIAGNOSIS — M9901 Segmental and somatic dysfunction of cervical region: Secondary | ICD-10-CM | POA: Diagnosis not present

## 2016-11-18 DIAGNOSIS — M546 Pain in thoracic spine: Secondary | ICD-10-CM | POA: Diagnosis not present

## 2016-11-18 DIAGNOSIS — M542 Cervicalgia: Secondary | ICD-10-CM | POA: Diagnosis not present

## 2016-11-25 DIAGNOSIS — M542 Cervicalgia: Secondary | ICD-10-CM | POA: Diagnosis not present

## 2016-11-25 DIAGNOSIS — M546 Pain in thoracic spine: Secondary | ICD-10-CM | POA: Diagnosis not present

## 2016-11-25 DIAGNOSIS — M545 Low back pain: Secondary | ICD-10-CM | POA: Diagnosis not present

## 2016-11-25 DIAGNOSIS — M9903 Segmental and somatic dysfunction of lumbar region: Secondary | ICD-10-CM | POA: Diagnosis not present

## 2016-11-25 DIAGNOSIS — M9902 Segmental and somatic dysfunction of thoracic region: Secondary | ICD-10-CM | POA: Diagnosis not present

## 2016-11-25 DIAGNOSIS — M9901 Segmental and somatic dysfunction of cervical region: Secondary | ICD-10-CM | POA: Diagnosis not present

## 2016-12-09 DIAGNOSIS — M9901 Segmental and somatic dysfunction of cervical region: Secondary | ICD-10-CM | POA: Diagnosis not present

## 2016-12-09 DIAGNOSIS — M542 Cervicalgia: Secondary | ICD-10-CM | POA: Diagnosis not present

## 2016-12-09 DIAGNOSIS — M9902 Segmental and somatic dysfunction of thoracic region: Secondary | ICD-10-CM | POA: Diagnosis not present

## 2016-12-09 DIAGNOSIS — M546 Pain in thoracic spine: Secondary | ICD-10-CM | POA: Diagnosis not present

## 2016-12-09 DIAGNOSIS — M545 Low back pain: Secondary | ICD-10-CM | POA: Diagnosis not present

## 2016-12-09 DIAGNOSIS — M9903 Segmental and somatic dysfunction of lumbar region: Secondary | ICD-10-CM | POA: Diagnosis not present

## 2017-01-25 DIAGNOSIS — H6123 Impacted cerumen, bilateral: Secondary | ICD-10-CM | POA: Diagnosis not present

## 2017-04-27 DIAGNOSIS — H6123 Impacted cerumen, bilateral: Secondary | ICD-10-CM | POA: Diagnosis not present

## 2017-05-02 DIAGNOSIS — Z1389 Encounter for screening for other disorder: Secondary | ICD-10-CM | POA: Diagnosis not present

## 2017-05-02 DIAGNOSIS — I2510 Atherosclerotic heart disease of native coronary artery without angina pectoris: Secondary | ICD-10-CM | POA: Diagnosis not present

## 2017-05-02 DIAGNOSIS — Z7189 Other specified counseling: Secondary | ICD-10-CM | POA: Diagnosis not present

## 2017-05-02 DIAGNOSIS — E039 Hypothyroidism, unspecified: Secondary | ICD-10-CM | POA: Diagnosis not present

## 2017-05-02 DIAGNOSIS — R7301 Impaired fasting glucose: Secondary | ICD-10-CM | POA: Diagnosis not present

## 2017-05-02 DIAGNOSIS — E782 Mixed hyperlipidemia: Secondary | ICD-10-CM | POA: Diagnosis not present

## 2017-05-02 DIAGNOSIS — M5136 Other intervertebral disc degeneration, lumbar region: Secondary | ICD-10-CM | POA: Diagnosis not present

## 2017-05-02 DIAGNOSIS — N3941 Urge incontinence: Secondary | ICD-10-CM | POA: Diagnosis not present

## 2017-05-02 DIAGNOSIS — G629 Polyneuropathy, unspecified: Secondary | ICD-10-CM | POA: Diagnosis not present

## 2017-05-02 DIAGNOSIS — Z0000 Encounter for general adult medical examination without abnormal findings: Secondary | ICD-10-CM | POA: Diagnosis not present

## 2017-05-05 DIAGNOSIS — E782 Mixed hyperlipidemia: Secondary | ICD-10-CM | POA: Diagnosis not present

## 2017-05-05 DIAGNOSIS — N3941 Urge incontinence: Secondary | ICD-10-CM | POA: Diagnosis not present

## 2017-05-05 DIAGNOSIS — R7301 Impaired fasting glucose: Secondary | ICD-10-CM | POA: Diagnosis not present

## 2017-05-05 DIAGNOSIS — E039 Hypothyroidism, unspecified: Secondary | ICD-10-CM | POA: Diagnosis not present

## 2017-07-27 DIAGNOSIS — H6123 Impacted cerumen, bilateral: Secondary | ICD-10-CM | POA: Diagnosis not present

## 2017-08-15 ENCOUNTER — Inpatient Hospital Stay (HOSPITAL_COMMUNITY)
Admission: EM | Admit: 2017-08-15 | Discharge: 2017-08-24 | DRG: 217 | Disposition: A | Discharge status: Home / Self Care | Payer: PPO | Attending: Cardiothoracic Surgery | Admitting: Cardiothoracic Surgery

## 2017-08-15 ENCOUNTER — Emergency Department (HOSPITAL_COMMUNITY): Payer: PPO

## 2017-08-15 ENCOUNTER — Encounter (HOSPITAL_COMMUNITY): Payer: Self-pay | Admitting: Emergency Medicine

## 2017-08-15 DIAGNOSIS — J90 Pleural effusion, not elsewhere classified: Secondary | ICD-10-CM | POA: Diagnosis not present

## 2017-08-15 DIAGNOSIS — Z4682 Encounter for fitting and adjustment of non-vascular catheter: Secondary | ICD-10-CM

## 2017-08-15 DIAGNOSIS — J811 Chronic pulmonary edema: Secondary | ICD-10-CM | POA: Diagnosis not present

## 2017-08-15 DIAGNOSIS — Z823 Family history of stroke: Secondary | ICD-10-CM | POA: Diagnosis not present

## 2017-08-15 DIAGNOSIS — Z951 Presence of aortocoronary bypass graft: Secondary | ICD-10-CM

## 2017-08-15 DIAGNOSIS — F329 Major depressive disorder, single episode, unspecified: Secondary | ICD-10-CM | POA: Diagnosis present

## 2017-08-15 DIAGNOSIS — I200 Unstable angina: Secondary | ICD-10-CM | POA: Diagnosis not present

## 2017-08-15 DIAGNOSIS — J984 Other disorders of lung: Secondary | ICD-10-CM | POA: Diagnosis not present

## 2017-08-15 DIAGNOSIS — D62 Acute posthemorrhagic anemia: Secondary | ICD-10-CM | POA: Diagnosis not present

## 2017-08-15 DIAGNOSIS — J449 Chronic obstructive pulmonary disease, unspecified: Secondary | ICD-10-CM | POA: Diagnosis present

## 2017-08-15 DIAGNOSIS — I350 Nonrheumatic aortic (valve) stenosis: Secondary | ICD-10-CM

## 2017-08-15 DIAGNOSIS — I739 Peripheral vascular disease, unspecified: Secondary | ICD-10-CM | POA: Diagnosis not present

## 2017-08-15 DIAGNOSIS — Z955 Presence of coronary angioplasty implant and graft: Secondary | ICD-10-CM | POA: Diagnosis not present

## 2017-08-15 DIAGNOSIS — R079 Chest pain, unspecified: Secondary | ICD-10-CM | POA: Diagnosis not present

## 2017-08-15 DIAGNOSIS — Z01810 Encounter for preprocedural cardiovascular examination: Secondary | ICD-10-CM | POA: Diagnosis not present

## 2017-08-15 DIAGNOSIS — Z6834 Body mass index (BMI) 34.0-34.9, adult: Secondary | ICD-10-CM

## 2017-08-15 DIAGNOSIS — Z952 Presence of prosthetic heart valve: Secondary | ICD-10-CM

## 2017-08-15 DIAGNOSIS — M48061 Spinal stenosis, lumbar region without neurogenic claudication: Secondary | ICD-10-CM | POA: Diagnosis not present

## 2017-08-15 DIAGNOSIS — Z7982 Long term (current) use of aspirin: Secondary | ICD-10-CM | POA: Diagnosis not present

## 2017-08-15 DIAGNOSIS — Z79899 Other long term (current) drug therapy: Secondary | ICD-10-CM

## 2017-08-15 DIAGNOSIS — E8770 Fluid overload, unspecified: Secondary | ICD-10-CM | POA: Diagnosis not present

## 2017-08-15 DIAGNOSIS — F17210 Nicotine dependence, cigarettes, uncomplicated: Secondary | ICD-10-CM | POA: Diagnosis present

## 2017-08-15 DIAGNOSIS — R9431 Abnormal electrocardiogram [ECG] [EKG]: Secondary | ICD-10-CM | POA: Diagnosis not present

## 2017-08-15 DIAGNOSIS — I2510 Atherosclerotic heart disease of native coronary artery without angina pectoris: Secondary | ICD-10-CM | POA: Diagnosis not present

## 2017-08-15 DIAGNOSIS — N529 Male erectile dysfunction, unspecified: Secondary | ICD-10-CM | POA: Diagnosis present

## 2017-08-15 DIAGNOSIS — E7800 Pure hypercholesterolemia, unspecified: Secondary | ICD-10-CM | POA: Diagnosis not present

## 2017-08-15 DIAGNOSIS — K5900 Constipation, unspecified: Secondary | ICD-10-CM | POA: Diagnosis not present

## 2017-08-15 DIAGNOSIS — Z86010 Personal history of colonic polyps: Secondary | ICD-10-CM

## 2017-08-15 DIAGNOSIS — D689 Coagulation defect, unspecified: Secondary | ICD-10-CM | POA: Diagnosis not present

## 2017-08-15 DIAGNOSIS — Z79890 Hormone replacement therapy (postmenopausal): Secondary | ICD-10-CM

## 2017-08-15 DIAGNOSIS — I10 Essential (primary) hypertension: Secondary | ICD-10-CM | POA: Diagnosis present

## 2017-08-15 DIAGNOSIS — E039 Hypothyroidism, unspecified: Secondary | ICD-10-CM | POA: Diagnosis present

## 2017-08-15 DIAGNOSIS — M5030 Other cervical disc degeneration, unspecified cervical region: Secondary | ICD-10-CM | POA: Diagnosis not present

## 2017-08-15 DIAGNOSIS — E785 Hyperlipidemia, unspecified: Secondary | ICD-10-CM | POA: Diagnosis not present

## 2017-08-15 DIAGNOSIS — E669 Obesity, unspecified: Secondary | ICD-10-CM | POA: Diagnosis present

## 2017-08-15 DIAGNOSIS — I25110 Atherosclerotic heart disease of native coronary artery with unstable angina pectoris: Secondary | ICD-10-CM | POA: Diagnosis not present

## 2017-08-15 DIAGNOSIS — I255 Ischemic cardiomyopathy: Secondary | ICD-10-CM | POA: Diagnosis present

## 2017-08-15 DIAGNOSIS — I358 Other nonrheumatic aortic valve disorders: Secondary | ICD-10-CM | POA: Diagnosis not present

## 2017-08-15 DIAGNOSIS — Z954 Presence of other heart-valve replacement: Secondary | ICD-10-CM | POA: Diagnosis not present

## 2017-08-15 DIAGNOSIS — I214 Non-ST elevation (NSTEMI) myocardial infarction: Principal | ICD-10-CM | POA: Diagnosis present

## 2017-08-15 DIAGNOSIS — I252 Old myocardial infarction: Secondary | ICD-10-CM

## 2017-08-15 DIAGNOSIS — I340 Nonrheumatic mitral (valve) insufficiency: Secondary | ICD-10-CM | POA: Diagnosis not present

## 2017-08-15 DIAGNOSIS — I083 Combined rheumatic disorders of mitral, aortic and tricuspid valves: Secondary | ICD-10-CM | POA: Diagnosis not present

## 2017-08-15 DIAGNOSIS — R072 Precordial pain: Secondary | ICD-10-CM | POA: Diagnosis not present

## 2017-08-15 DIAGNOSIS — R0602 Shortness of breath: Secondary | ICD-10-CM | POA: Diagnosis not present

## 2017-08-15 DIAGNOSIS — E0590 Thyrotoxicosis, unspecified without thyrotoxic crisis or storm: Secondary | ICD-10-CM | POA: Diagnosis present

## 2017-08-15 DIAGNOSIS — R011 Cardiac murmur, unspecified: Secondary | ICD-10-CM | POA: Diagnosis present

## 2017-08-15 LAB — BASIC METABOLIC PANEL
Anion gap: 13 (ref 5–15)
BUN: 16 mg/dL (ref 6–20)
CHLORIDE: 101 mmol/L (ref 101–111)
CO2: 27 mmol/L (ref 22–32)
Calcium: 9.2 mg/dL (ref 8.9–10.3)
Creatinine, Ser: 0.98 mg/dL (ref 0.61–1.24)
GFR calc Af Amer: >60 mL/min (ref >60)
GFR calc non Af Amer: >60 mL/min (ref >60)
GLUCOSE: 131 mg/dL — AB (ref 65–99)
Potassium: 4.1 mmol/L (ref 3.5–5.1)
SODIUM: 141 mmol/L (ref 135–145)

## 2017-08-15 LAB — I-STAT TROPONIN, ED: Troponin i, poc: 0.33 ng/mL (ref 0.00–0.08)

## 2017-08-15 LAB — CBC
HEMATOCRIT: 48.7 % (ref 39.0–52.0)
Hemoglobin: 15.9 g/dL (ref 13.0–17.0)
MCH: 32.3 pg (ref 26.0–34.0)
MCHC: 32.6 g/dL (ref 30.0–36.0)
MCV: 99 fL (ref 78.0–100.0)
PLATELETS: 144 10*3/uL — AB (ref 150–400)
RBC: 4.92 MIL/uL (ref 4.22–5.81)
RDW: 13.6 % (ref 11.5–15.5)
WBC: 7.9 10*3/uL (ref 4.0–10.5)

## 2017-08-15 MED ORDER — SODIUM CHLORIDE 0.9 % IV SOLN
INTRAVENOUS | Status: DC
  Administered 2017-08-16: 01:00:00 via INTRAVENOUS

## 2017-08-15 MED ORDER — LEVOTHYROXINE SODIUM 75 MCG PO TABS
175.0000 ug | ORAL_TABLET | Freq: Every day | ORAL | Status: DC
  Filled 2017-08-15: qty 1

## 2017-08-15 MED ORDER — ONDANSETRON HCL 4 MG/2ML IJ SOLN: 4.0000 mg | Freq: Four times a day (QID) | INTRAMUSCULAR | Status: DC | PRN

## 2017-08-15 MED ORDER — NITROGLYCERIN 0.4 MG SL SUBL
0.4000 mg | SUBLINGUAL_TABLET | SUBLINGUAL | Status: DC | PRN
  Filled 2017-08-15: qty 1

## 2017-08-15 MED ORDER — ASPIRIN 300 MG RE SUPP: 300.0000 mg | RECTAL | Status: DC

## 2017-08-15 MED ORDER — ACETAMINOPHEN 325 MG PO TABS: 650.0000 mg | ORAL_TABLET | ORAL | Status: DC | PRN

## 2017-08-15 MED ORDER — ASPIRIN 81 MG PO CHEW
324.0000 mg | CHEWABLE_TABLET | Freq: Once | ORAL | Status: AC
  Administered 2017-08-16: 324 mg via ORAL
  Filled 2017-08-15: qty 4

## 2017-08-15 MED ORDER — VITAMIN B-12 1000 MCG PO TABS
1000.0000 ug | ORAL_TABLET | Freq: Every day | ORAL | Status: DC
  Filled 2017-08-15: qty 1

## 2017-08-15 MED ORDER — ASPIRIN 81 MG PO CHEW: 324.0000 mg | CHEWABLE_TABLET | ORAL | Status: DC

## 2017-08-15 MED ORDER — HEPARIN (PORCINE) IN NACL 100-0.45 UNIT/ML-% IJ SOLN
1400.0000 [IU]/h | INTRAMUSCULAR | Status: DC
  Administered 2017-08-15: 1200 [IU]/h via INTRAVENOUS
  Filled 2017-08-15 (×3): qty 250

## 2017-08-15 MED ORDER — NITROGLYCERIN 0.4 MG SL SUBL
0.4000 mg | SUBLINGUAL_TABLET | SUBLINGUAL | Status: DC | PRN
  Administered 2017-08-16: 0.4 mg via SUBLINGUAL

## 2017-08-15 MED ORDER — ATORVASTATIN CALCIUM 40 MG PO TABS
40.0000 mg | ORAL_TABLET | Freq: Every day | ORAL | Status: DC
  Filled 2017-08-15: qty 1

## 2017-08-15 MED ORDER — ASPIRIN 81 MG PO CHEW: 324.0000 mg | CHEWABLE_TABLET | Freq: Every day | ORAL | Status: DC

## 2017-08-15 MED ORDER — ASPIRIN EC 81 MG PO TBEC: 81.0000 mg | DELAYED_RELEASE_TABLET | Freq: Every day | ORAL | Status: DC

## 2017-08-15 MED ORDER — HEPARIN BOLUS VIA INFUSION
4000.0000 [IU] | Freq: Once | INTRAVENOUS | Status: AC
  Administered 2017-08-15: 4000 [IU] via INTRAVENOUS
  Filled 2017-08-15: qty 4000

## 2017-08-15 NOTE — H&P (Signed)
Cardiology Admission History and Physical:   Patient ID: Jordan Hebert; MRN: [id number]; DOB: 02-05-1941   Admission date: 08/15/2017  Primary Care Provider: Kirby Funk, MD Primary Cardiologist: No primary care provider on file.  Primary Electrophysiologist:  None  Chief Complaint:  Chest pain, Diaphoresis,    Patient Profile:    Jordan Hebert is a 77 y.o. Male with a history of CAD, remote history of 3 stents placed 22 years ago,   hyperlipidemia, hypothyroidism, lumbar spinal stenosis, and depression, who presents to the emergency department for evaluation of upper chest pain.        History of Present Illness:   Jordan Hebert is a    Jordan Hebert is a 77 y.o. Male with a history of CAD, remote history of 3 stents placed 22 years ago,   hyperlipidemia, hypothyroidism, lumbar spinal stenosis, and depression, who presents to the emergency department for evaluation of upper chest pain.     Patient presents from PCP for assessment of left upper (around clavicle) chest pain x 1 week, intermittent with exercise, becoming constant 2 days ago.  Patient was so uncomfortable last night he did not get any sleep.  Patient is in no pain at this time after taking a muslce relaxer, aspirin and ibuprofen.  Patient c/o nausea, denies vomiting, c/o light-headedness with standing, and denies SOB.  Jordan Hebert is a 77 y.o. Male with a history of CAD, remote history of 3 stents placed 22 years ago, hyperlipidemia, hypothyroidism, lumbar spinal stenosis, and depression, who presents to the emergency department for evaluation of upper chest pain.    Patient reports 6 days ago he began having intermittent pain across the top of his chest that was worse with movement and exercise, but about 2 days ago pain became more constant, he reports its not severe pain may be a 3/10, dull constant ache, but last night it kept him up from sleeping.    Pain is worse with exertion, and pain has been  increasing with smaller and smaller amounts of exertion over the past 2 days.  He reports today he felt like the pain was radiating to his jaw, no radiation to the neck or arm.    Patient reports this afternoon he took a muscle relaxer, 325 mg of aspirin and ibuprofen and a hot shower and pain was completely relieved.  Patient reports he felt sweaty and like his mouth was watering earlier, but denies any nausea or vomiting.  He denies any associated shortness of breath, felt a little bit lightheaded upon standing, but has not had any syncopal episodes.    Patient had 3 stents placed 22 years ago, reports after thinking about it he does feel like he had similar symptoms to those he is presenting with today before this.  Patient reports since then he has had no problems with his heart, he completed cardiac rehab, he is on cholesterol medication, but does not require blood pressure medication is not diabetic, takes a baby aspirin daily but otherwise his heart has remained healthy.  Patient does report he is a daily smoker, and drinks 2-3 drinks most days.  Patient denies any lower extremity swelling or pain, no recent long distance travel, surgeries, no hormone therapy, no cough, fevers, or hemoptysis.  Patient denies any abdominal pain.       Past Medical History:  Diagnosis Date  . Back pain   . Colon polyps   . Degenerative lumbar spinal stenosis   . Depression   .  Erectile dysfunction   . Gynecomastia   . High cholesterol   . Hyperthyroidism   . Rheumatic fever   . Seborrhea   . Umbilical hernia     Past Surgical History:  Procedure Laterality Date  . CORONARY STENT PLACEMENT       Medications Prior to Admission: Prior to Admission medications   Medication Sig Start Date End Date Taking? Authorizing Provider  aspirin 81 MG chewable tablet Chew by mouth daily.    [provider]  atorvastatin (LIPITOR) 40 MG tablet Take 40 mg by mouth daily.    [provider]    cyclobenzaprine (FLEXERIL) 10 MG tablet Take 10 mg by mouth 3 (three) times daily as needed for muscle spasms.    [provider]  levothyroxine (SYNTHROID, LEVOTHROID) 175 MCG tablet Take 175 mcg by mouth daily before breakfast.    [provider]  tadalafil (CIALIS) 5 MG tablet Take 5 mg by mouth daily as needed for erectile dysfunction.    [provider]  vitamin B-12 (CYANOCOBALAMIN) 1000 MCG tablet Take 1,000 mcg by mouth daily.    [provider]     Allergies:   No Known Allergies  Social History:   Social History   Socioeconomic History  . Marital status: Married    Spouse name: Not on file  . Number of children: 2  . Years of education: Not on file  . Highest education level: Not on file  Occupational History  . Occupation: retired  Engineer, production  . Financial resource strain: Not on file  . Food insecurity:    Worry: Not on file    Inability: Not on file  . Transportation needs:    Medical: Not on file    Non-medical: Not on file  Tobacco Use  . Smoking status: Current Every Day Smoker  . Smokeless tobacco: Never Used  Substance and Sexual Activity  . Alcohol use: Yes  . Drug use: Not on file  . Sexual activity: Not on file  Lifestyle  . Physical activity:    Days per week: Not on file    Minutes per session: Not on file  . Stress: Not on file  Relationships  . Social connections:    Talks on phone: Not on file    Gets together: Not on file    Attends religious service: Not on file    Active member of club or organization: Not on file    Attends meetings of clubs or organizations: Not on file    Relationship status: Not on file  . Intimate partner violence:    Fear of current or ex partner: Not on file    Emotionally abused: Not on file    Physically abused: Not on file    Forced sexual activity: Not on file  Other Topics Concern  . Not on file  Social History Narrative   He lives with wife.    He is retired from  [employer], applications)             Family History:   The patient's family history includes Aneurysm in his sister; Cancer in his brother; Other in his son; Stroke in his mother.    ROS:  Please see the history of present illness.  All other ROS reviewed and negative.     Physical Exam/Data:    Review of Systems Review of Systems  Constitutional: Negative for chills and fever.  HENT: Negative for congestion, rhinorrhea and sore throat.  Eyes: Negative for photophobia and visual disturbance.  Respiratory: Negative for cough, chest tightness, shortness of breath and wheezing.   Cardiovascular: Positive for chest pain. Negative for palpitations and leg swelling.  Gastrointestinal: Negative for abdominal pain, blood in stool, constipation, diarrhea, nausea and vomiting.  Genitourinary: Negative for dysuria and frequency.  Musculoskeletal: Negative for arthralgias, joint swelling and myalgias.  Skin: Negative for color change and rash.  Neurological: Negative for dizziness, syncope, weakness, light-headedness, numbness and headaches.     Physical Exam Updated Vital Signs BP 110/78 (BP Location: Right Arm)   Pulse 83   Temp 98.1 F (36.7 C) (Oral)   Resp 18   SpO2 96%   Physical Exam  Constitutional: He appears well-developed and well-nourished. No distress.  HENT:  Head: Normocephalic and atraumatic.  Mouth/Throat: Oropharynx is clear and moist.  Eyes: Right eye exhibits no discharge. Left eye exhibits no discharge.  Neck: Normal range of motion. Neck supple.  Cardiovascular: Normal rate, regular rhythm and intact distal pulses.  Murmur heard. Pulses:      Radial pulses are 2+ on the right side, and 2+ on the left side.       Dorsalis pedis pulses are 2+ on the right side, and 2+ on the left side.       Posterior tibial pulses are 2+ on the right side, and 2+ on the left side.  Pulmonary/Chest: Effort normal. No stridor. No respiratory distress. He has  no rales.  Respirations equal and unlabored, patient able to speak in full sentences, lungs clear to auscultation bilaterally with a few faint scattered end expiratory wheezes, I suspect this is patient's baseline given long smoking history.  Abdominal: Soft. Bowel sounds are normal. He exhibits no distension and no mass. There is no tenderness. There is no guarding.  Musculoskeletal: He exhibits no edema or deformity.  Bilateral lower extremities warm and well-perfused without edema or tenderness  Neurological: He is alert. Coordination normal.  Skin: Skin is warm and dry. Capillary refill takes less than 2 seconds. He is not diaphoretic.  Psychiatric: He has a normal mood and affect. His behavior is normal.  Nursing note and vitals reviewed.    ED Treatments / Results  Labs (all labs ordered are listed, but only abnormal results are displayed)      Labs Reviewed  BASIC METABOLIC PANEL - Abnormal; Notable for the following components:      Result Value    Glucose, Bld 131 (*)    All other components within normal limits  CBC - Abnormal; Notable for the following components:   Platelets 144 (*)    All other components within normal limits  I-STAT TROPONIN, ED - Abnormal; Notable for the following components:   Troponin i, poc 0.33 (*)    All other components within normal limits  HEPARIN LEVEL (UNFRACTIONATED)  CBC    EKG EKG Interpretation  Date/Time:                  Monday Aug 15 2017 18:27:42 EDT Ventricular Rate:         89 PR Interval:                 144 QRS Duration: 102 QT Interval:                 374 QTC Calculation:        455 R Axis:                         -  21 Text Interpretation:       Normal sinus rhythm ST & T wave abnormality, consider lateral ischemia Abnormal ECG No old tracing to compare Diffuse St-t depressions, possible elevation in aVR but does not meet STEMI criteria Confirmed by Shaune Pollack [phone]) on 08/15/2017 7:15:55  PM   Radiology  ImagingResults(Last48hours)  Dg Chest 2 View  Result Date: 08/15/2017 CLINICAL DATA:  Chest pain EXAM: CHEST - 2 VIEW COMPARISON:  01/26/2013 FINDINGS: Mild increased bronchitic changes. No consolidation or effusion. Stable cardiomediastinal silhouette. No pneumothorax. IMPRESSION: Slight increased bronchitic changes. Otherwise no significant acute interval changes. Electronically Signed   By: Jasmine Pang M.D.   On: 08/15/2017 19:51     Procedures Procedures (including critical care time)  Medications Ordered in ED Medications  heparin ADULT infusion 100 units/mL (25000 units/241mL sodium chloride 0.45%) (1,200 Units/hr Intravenous New Bag/Given 08/15/17 2026)  heparin bolus via infusion 4,000 Units (4,000 Units Intravenous Bolus from Bag 08/15/17 2025)     Initial Impression / Assessment and Plan / ED Course  I have reviewed the triage vital signs and the nursing notes.  Pertinent labs & imaging results that were available during my care of the patient were reviewed by me and considered in my medical decision making (see chart for details).  Patient presents to the ED for evaluation of exertional chest pain.  CAD with history of 3 stents placed 22 years ago.  No current cardiologist which she follows with.  Was initially intermittent but has become constant over the past 2 days and worse with exertion.  Today pain began to radiate to the jaw.  No associated shortness of breath or syncope.  No abdominal pain, nausea or vomiting, no fevers.  Vitals normal on arrival.  Patient is currently pain-free, last time patient had significant chest pain was at 1:30 PM this afternoon.  Patient has had 325 of aspirin prior to arrival.  EKG with concerning changes, some depressions and ST changes in the anterior lateral leads.  Patient with systolic, likely aortic murmur on exam, lungs with faint expiratory wheeze but I feel this is likely patient's baseline from daily  smoking.   Troponin is elevated at 0.33, repeat EKG again shows ST depressions in the anterior lateral leads concerning for an STEMI.  No leukocytosis, hemoglobin normal, platelets slightly decreased at 144.  No acute electrolyte derangements requiring intervention.  Chest x-ray with slightly increased bronchitic changes, no other evidence of active cardiopulmonary disease.    Chemistry Recent Labs  Lab 08/15/17 1840  NA 141  K 4.1  CL 101  CO2 27  GLUCOSE 131*  BUN 16  CREATININE 0.98  CALCIUM 9.2  GFRNONAA >60  GFRAA >60  ANIONGAP 13    No results for input(s): PROT, ALBUMIN, AST, ALT, ALKPHOS, BILITOT in the last 168 hours. Hematology Recent Labs  Lab 08/15/17 1840  WBC 7.9  RBC 4.92  HGB 15.9  HCT 48.7  MCV 99.0  MCH 32.3  MCHC 32.6  RDW 13.6  PLT 144*   Cardiac EnzymesNo results for input(s): TROPONINI in the last 168 hours.  Recent Labs  Lab 08/15/17 1853  TROPIPOC 0.33*    BNPNo results for input(s): BNP, PROBNP in the last 168 hours.  DDimer No results for input(s): DDIMER in the last 168 hours.  Radiology/Studies:  Dg Chest 2 View  Result Date: 08/15/2017 CLINICAL DATA:  Chest pain EXAM: CHEST - 2 VIEW COMPARISON:  01/26/2013 FINDINGS: Mild increased bronchitic changes. No consolidation or effusion. Stable  cardiomediastinal silhouette. No pneumothorax. IMPRESSION: Slight increased bronchitic changes. Otherwise no significant acute interval changes. Electronically Signed   By: Jasmine Pang M.D.   On: 08/15/2017 19:51    Jordan Hebert is a 77 y.o. Male with a history of CAD, remote history of 3 stents placed 22 years ago, hyperlipidemia, hypothyroidism, lumbar spinal stenosis, and depression, who presents to the emergency department for evaluation of upper chest pain.        Assessment and Plan:   ABNL EKG ANGINA NSTEMI DIFFUSE ST DEPRESSIONS, SUGGESTIVE OF  MYOCARDIAL ISCHEMIA    Stable  Vitals,  Slightly  Elevated  Enzymes, trending, Due  to abnl  Ekg, and  Chest pain, hep gtt  , optimised  Med magmt  strated and will place request  For Left  Heart cath eval , and  Possible  PCI  In am       Severity of Illness: The appropriate patient status for this patient is INPATIENT. Inpatient status is judged to be reasonable and necessary in order to provide the required intensity of service to ensure the patient's safety. The patient's presenting symptoms, physical exam findings, and initial radiographic and laboratory data in the context of their chronic comorbidities is felt to place them at high risk for further clinical deterioration. Furthermore, it is not anticipated that the patient will be medically stable for discharge from the hospital within 2 midnights of admission. The following factors support the patient status of inpatient.   " The patient's presenting symptoms include  ANGINA. " The worrisome physical exam findings include   ANGINA,   " The initial radiographic and laboratory data are worrisome because of ABNL  EKG  AND  SLIGHTLY ELEVATED ENZYMES  " The chronic co-morbidities include   HTN  CAD    * I certify that at the point of admission it is my clinical judgment that the patient will require inpatient hospital care spanning beyond 2 midnights from the point of admission due to high intensity of service, high risk for further deterioration and high frequency of surveillance required.*    For questions or updates, please contact [facility] Please consult [email or web address] for contact info under Cardiology/STEMI.    Signed, Lynwood Dawley, MD  08/15/2017 8:59 PM

## 2017-08-15 NOTE — ED Notes (Signed)
Patient transported to X-ray 

## 2017-08-15 NOTE — ED Provider Notes (Addendum)
[facility] EMERGENCY DEPARTMENT Provider Note   CSN: [id number] Arrival date & time: 08/15/17  1821     History   Chief Complaint Chief Complaint  Patient presents with  . Chest Pain    HPI Jordan Hebert is a 77 y.o. male.  Jordan Hebert is a 77 y.o. Male with a history of CAD, remote history of 3 stents placed 22 years ago, hyperlipidemia, hypothyroidism, lumbar spinal stenosis, and depression, who presents to the emergency department for evaluation of upper chest pain.  Patient reports 6 days ago he began having intermittent pain across the top of his chest that was worse with movement and exercise, but about 2 days ago pain became more constant, he reports its not severe pain may be a 3/10, dull constant ache, but last night it kept him up from sleeping.  Pain is worse with exertion, and pain has been increasing with smaller and smaller amounts of exertion over the past 2 days.  He reports today he felt like the pain was radiating to his jaw, no radiation to the neck or arm.  Patient reports this afternoon he took a muscle relaxer, 325 mg of aspirin and ibuprofen and a hot shower and pain was completely relieved.  Patient reports he felt sweaty and like his mouth was watering earlier, but denies any nausea or vomiting.  He denies any associated shortness of breath, felt a little bit lightheaded upon standing, but has not had any syncopal episodes.  Patient had 3 stents placed 22 years ago, reports after thinking about it he does feel like he had similar symptoms to those he is presenting with today before this.  Patient reports since then he has had no problems with his heart, he completed cardiac rehab, he is on cholesterol medication, but does not require blood pressure medication is not diabetic, takes a baby aspirin daily but otherwise his heart has remained healthy.  Patient does report he is a daily smoker, and drinks 2-3 drinks most days.  Patient denies any lower  extremity swelling or pain, no recent long distance travel, surgeries, no hormone therapy, no cough, fevers, or hemoptysis.  Patient denies any abdominal pain.     Past Medical History:  Diagnosis Date  . Back pain   . Colon polyps   . Degenerative lumbar spinal stenosis   . Depression   . Erectile dysfunction   . Gynecomastia   . High cholesterol   . Hyperthyroidism   . Rheumatic fever   . Seborrhea   . Umbilical hernia     Patient Active Problem List   Diagnosis Date Noted  . Unspecified hereditary and idiopathic peripheral neuropathy 06/22/2013    Past Surgical History:  Procedure Laterality Date  . CORONARY STENT PLACEMENT          Home Medications    Prior to Admission medications   Medication Sig Start Date End Date Taking? Authorizing Provider  aspirin 81 MG chewable tablet Chew by mouth daily.    [provider]  atorvastatin (LIPITOR) 40 MG tablet Take 40 mg by mouth daily.    [provider]  cyclobenzaprine (FLEXERIL) 10 MG tablet Take 10 mg by mouth 3 (three) times daily as needed for muscle spasms.    [provider]  levothyroxine (SYNTHROID, LEVOTHROID) 175 MCG tablet Take 175 mcg by mouth daily before breakfast.    [provider]  tadalafil (CIALIS) 5 MG tablet Take 5 mg by mouth daily as needed  for erectile dysfunction.    [provider]  vitamin B-12 (CYANOCOBALAMIN) 1000 MCG tablet Take 1,000 mcg by mouth daily.    [provider]    Family History Family History  Problem Relation Age of Onset  . Stroke Mother   . Cancer Brother   . Aneurysm Sister        Cerebral   . Other Son        Guillain-Barre Syndrome    Social History Social History   Tobacco Use  . Smoking status: Current Every Day Smoker  . Smokeless tobacco: Never Used  Substance Use Topics  . Alcohol use: Yes  . Drug use: Not on file     Allergies   Patient has no known allergies.   Review of Systems Review  of Systems  Constitutional: Negative for chills and fever.  HENT: Negative for congestion, rhinorrhea and sore throat.   Eyes: Negative for photophobia and visual disturbance.  Respiratory: Negative for cough, chest tightness, shortness of breath and wheezing.   Cardiovascular: Positive for chest pain. Negative for palpitations and leg swelling.  Gastrointestinal: Negative for abdominal pain, blood in stool, constipation, diarrhea, nausea and vomiting.  Genitourinary: Negative for dysuria and frequency.  Musculoskeletal: Negative for arthralgias, joint swelling and myalgias.  Skin: Negative for color change and rash.  Neurological: Negative for dizziness, syncope, weakness, light-headedness, numbness and headaches.     Physical Exam Updated Vital Signs BP 110/78 (BP Location: Right Arm)   Pulse 83   Temp 98.1 F (36.7 C) (Oral)   Resp 18   SpO2 96%   Physical Exam  Constitutional: He appears well-developed and well-nourished. No distress.  HENT:  Head: Normocephalic and atraumatic.  Mouth/Throat: Oropharynx is clear and moist.  Eyes: Right eye exhibits no discharge. Left eye exhibits no discharge.  Neck: Normal range of motion. Neck supple.  Cardiovascular: Normal rate, regular rhythm and intact distal pulses.  Murmur heard. Pulses:      Radial pulses are 2+ on the right side, and 2+ on the left side.       Dorsalis pedis pulses are 2+ on the right side, and 2+ on the left side.       Posterior tibial pulses are 2+ on the right side, and 2+ on the left side.  Pulmonary/Chest: Effort normal. No stridor. No respiratory distress. He has no rales.  Respirations equal and unlabored, patient able to speak in full sentences, lungs clear to auscultation bilaterally with a few faint scattered end expiratory wheezes, I suspect this is patient's baseline given long smoking history.  Abdominal: Soft. Bowel sounds are normal. He exhibits no distension and no mass. There is no tenderness.  There is no guarding.  Musculoskeletal: He exhibits no edema or deformity.  Bilateral lower extremities warm and well-perfused without edema or tenderness  Neurological: He is alert. Coordination normal.  Skin: Skin is warm and dry. Capillary refill takes less than 2 seconds. He is not diaphoretic.  Psychiatric: He has a normal mood and affect. His behavior is normal.  Nursing note and vitals reviewed.    ED Treatments / Results  Labs (all labs ordered are listed, but only abnormal results are displayed) Labs Reviewed  BASIC METABOLIC PANEL - Abnormal; Notable for the following components:      Result Value   Glucose, Bld 131 (*)    All other components within normal limits  CBC - Abnormal; Notable for the following components:   Platelets 144 (*)  All other components within normal limits  I-STAT TROPONIN, ED - Abnormal; Notable for the following components:   Troponin i, poc 0.33 (*)    All other components within normal limits  HEPARIN LEVEL (UNFRACTIONATED)  CBC    EKG EKG Interpretation  Date/Time:  Monday Aug 15 2017 18:27:42 EDT Ventricular Rate:  89 PR Interval:  144 QRS Duration: 102 QT Interval:  374 QTC Calculation: 455 R Axis:   -21 Text Interpretation:  Normal sinus rhythm ST & T wave abnormality, consider lateral ischemia Abnormal ECG No old tracing to compare Diffuse St-t depressions, possible elevation in aVR but does not meet STEMI criteria Confirmed by Shaune Pollack [id number]) on 08/15/2017 7:15:55 PM   Radiology Dg Chest 2 View  Result Date: 08/15/2017 CLINICAL DATA:  Chest pain EXAM: CHEST - 2 VIEW COMPARISON:  01/26/2013 FINDINGS: Mild increased bronchitic changes. No consolidation or effusion. Stable cardiomediastinal silhouette. No pneumothorax. IMPRESSION: Slight increased bronchitic changes. Otherwise no significant acute interval changes. Electronically Signed   By: Jasmine Pang M.D.   On: 08/15/2017 19:51    Procedures Procedures (including  critical care time)  Medications Ordered in ED Medications  heparin ADULT infusion 100 units/mL (25000 units/257mL sodium chloride 0.45%) (1,200 Units/hr Intravenous New Bag/Given 08/15/17 2026)  heparin bolus via infusion 4,000 Units (4,000 Units Intravenous Bolus from Bag 08/15/17 2025)     Initial Impression / Assessment and Plan / ED Course  I have reviewed the triage vital signs and the nursing notes.  Pertinent labs & imaging results that were available during my care of the patient were reviewed by me and considered in my medical decision making (see chart for details).  Patient presents to the ED for evaluation of exertional chest pain.  CAD with history of 3 stents placed 22 years ago.  No current cardiologist which she follows with.  Was initially intermittent but has become constant over the past 2 days and worse with exertion.  Today pain began to radiate to the jaw.  No associated shortness of breath or syncope.  No abdominal pain, nausea or vomiting, no fevers.  Vitals normal on arrival.  Patient is currently pain-free, last time patient had significant chest pain was at 1:30 PM this afternoon.  Patient has had 325 of aspirin prior to arrival.  EKG with concerning changes, some depressions and ST changes in the anterior lateral leads.  Patient with systolic, likely aortic murmur on exam, lungs with faint expiratory wheeze but I feel this is likely patient's baseline from daily smoking.   Troponin is elevated at 0.33, repeat EKG again shows ST depressions in the anterior lateral leads concerning for NSTEMI.  No leukocytosis, hemoglobin normal, platelets slightly decreased at 144.  No acute electrolyte derangements requiring intervention.  Chest x-ray with slightly increased bronchitic changes, no other evidence of active cardiopulmonary disease.  Positive troponin and EKG changes, and story very much concerning for unstable angina versus NSTEMI.  Heparin drip started.  Cardiology  consulted and will see and admit the patient.  Patient discussed with Dr. Erma Heritage, who saw patient as well and agrees with plan.  Final Clinical Impressions(s) / ED Diagnoses   Final diagnoses:  NSTEMI (non-ST elevated myocardial infarction) [facility])    ED Discharge Orders    None       Legrand Rams 08/15/17 2122    Dartha Lodge, PA-C 08/16/17 1129    Shaune Pollack, MD 08/16/17 [phone]

## 2017-08-15 NOTE — ED Notes (Signed)
ED Provider at bedside. 

## 2017-08-15 NOTE — ED Triage Notes (Signed)
Patient presents from PCP for assessment of left upper (around clavicle) chest pain x 1 week, intermittent with exercise, becoming constant 2 days ago.  Patient was so uncomfortable last night he did not get any sleep.  Patient is in no pain at this time after taking a muslce relaxer, aspirin and ibuprofen.  Patient c/o nausea, denies vomiting, c/o light-headedness with standing, and denies SOB.

## 2017-08-15 NOTE — ED Notes (Signed)
Pt back in room from xray 

## 2017-08-15 NOTE — Progress Notes (Signed)
ANTICOAGULATION CONSULT NOTE - Initial Consult  Pharmacy Consult for heparin Indication: chest pain/ACS  No Known Allergies  Patient Measurements:  Ideal body weight: 70 kg  Actual body weight: 100 kg (patient reported) Heparin Dosing Weight: 91 kg   Vital Signs: Temp: 98.1 F (36.7 C) (05/20 1827) Temp Source: Oral (05/20 1827) BP: 110/78 (05/20 1828) Pulse Rate: 83 (05/20 1828)  Labs: No results for input(s): HGB, HCT, PLT, APTT, LABPROT, INR, HEPARINUNFRC, HEPRLOWMOCWT, CREATININE, CKTOTAL, CKMB, TROPONINI in the last 72 hours.  CrCl cannot be calculated (No order found.).   Medical History: Past Medical History:  Diagnosis Date  . Back pain   . Colon polyps   . Degenerative lumbar spinal stenosis   . Depression   . Erectile dysfunction   . Gynecomastia   . High cholesterol   . Hyperthyroidism   . Rheumatic fever   . Seborrhea   . Umbilical hernia     Medications:   (Not in a hospital admission)  Assessment: 25 YOM here with chest pain to start IV heparin for ACS. H/H wnl. Plt 144k. He is not on any anticoagulants at home.   Goal of Therapy:  Heparin level 0.3-0.7 units/ml Monitor platelets by anticoagulation protocol: Yes   Plan:  -Heparin 4000 units IV once, then start heparin infusion at 1200 units/hr -F/u 8 hr HL -Monitor daily HL, CBC and s/s of bleeding  Vinnie Level, PharmD., BCPS Clinical Pharmacist Clinical phone for 08/15/17 until 11pm: [phone] If after 11pm, please call main pharmacy at: [phone]

## 2017-08-16 ENCOUNTER — Inpatient Hospital Stay (HOSPITAL_COMMUNITY): Payer: PPO

## 2017-08-16 ENCOUNTER — Other Ambulatory Visit: Payer: Self-pay | Admitting: *Deleted

## 2017-08-16 ENCOUNTER — Inpatient Hospital Stay (HOSPITAL_COMMUNITY)
Admission: EM | Disposition: A | Discharge status: Home / Self Care | Payer: Self-pay | Source: Home / Self Care | Attending: Cardiothoracic Surgery

## 2017-08-16 ENCOUNTER — Other Ambulatory Visit (HOSPITAL_COMMUNITY): Payer: PPO

## 2017-08-16 DIAGNOSIS — I739 Peripheral vascular disease, unspecified: Secondary | ICD-10-CM

## 2017-08-16 DIAGNOSIS — I340 Nonrheumatic mitral (valve) insufficiency: Secondary | ICD-10-CM

## 2017-08-16 DIAGNOSIS — Z01810 Encounter for preprocedural cardiovascular examination: Secondary | ICD-10-CM

## 2017-08-16 DIAGNOSIS — I25110 Atherosclerotic heart disease of native coronary artery with unstable angina pectoris: Secondary | ICD-10-CM

## 2017-08-16 DIAGNOSIS — I214 Non-ST elevation (NSTEMI) myocardial infarction: Secondary | ICD-10-CM

## 2017-08-16 DIAGNOSIS — I2510 Atherosclerotic heart disease of native coronary artery without angina pectoris: Secondary | ICD-10-CM

## 2017-08-16 DIAGNOSIS — I350 Nonrheumatic aortic (valve) stenosis: Secondary | ICD-10-CM

## 2017-08-16 DIAGNOSIS — E785 Hyperlipidemia, unspecified: Secondary | ICD-10-CM

## 2017-08-16 HISTORY — PX: CORONARY ANGIOGRAPHY: CATH118303

## 2017-08-16 HISTORY — PX: ABDOMINAL AORTOGRAM: CATH118222

## 2017-08-16 LAB — SPIROMETRY WITH GRAPH
FEF 25-75 Pre: 1.46 L/sec
FEF2575-%Pred-Pre: 68 %
FEV1-%Pred-Pre: 51 %
FEV1-Pre: 1.56 L
FEV1FVC-%Pred-Pre: 108 %
FEV6-%Pred-Pre: 51 %
FEV6-Pre: 2 L
FEV6FVC-%Pred-Pre: 106 %
FVC-%Pred-Pre: 48 %
FVC-Pre: 2 L
Pre FEV1/FVC ratio: 78 %
Pre FEV6/FVC Ratio: 100 %

## 2017-08-16 LAB — POCT I-STAT 3, ART BLOOD GAS (G3+)
Acid-base deficit: 1 mmol/L (ref 0.0–2.0)
BICARBONATE: 24.4 mmol/L (ref 20.0–28.0)
O2 SAT: 93 %
PO2 ART: 69 mmHg — AB (ref 83.0–108.0)
Patient temperature: 98.3
TCO2: 26 mmol/L (ref 22–32)
pCO2 arterial: 41.2 mmHg (ref 32.0–48.0)
pH, Arterial: 7.38 (ref 7.350–7.450)

## 2017-08-16 LAB — CBC
HCT: 46 % (ref 39.0–52.0)
HEMOGLOBIN: 14.9 g/dL (ref 13.0–17.0)
MCH: 32.3 pg (ref 26.0–34.0)
MCHC: 32.4 g/dL (ref 30.0–36.0)
MCV: 99.8 fL (ref 78.0–100.0)
Platelets: 128 10*3/uL — ABNORMAL LOW (ref 150–400)
RBC: 4.61 MIL/uL (ref 4.22–5.81)
RDW: 13.7 % (ref 11.5–15.5)
WBC: 8.4 10*3/uL (ref 4.0–10.5)

## 2017-08-16 LAB — COMPREHENSIVE METABOLIC PANEL
ALT: 24 U/L (ref 17–63)
AST: 23 U/L (ref 15–41)
Albumin: 3.3 g/dL — ABNORMAL LOW (ref 3.5–5.0)
Alkaline Phosphatase: 40 U/L (ref 38–126)
Anion gap: 5 (ref 5–15)
BUN: 13 mg/dL (ref 6–20)
CO2: 29 mmol/L (ref 22–32)
Calcium: 8.6 mg/dL — ABNORMAL LOW (ref 8.9–10.3)
Chloride: 107 mmol/L (ref 101–111)
Creatinine, Ser: 0.86 mg/dL (ref 0.61–1.24)
GFR calc Af Amer: >60 mL/min (ref >60)
GFR calc non Af Amer: >60 mL/min (ref >60)
Glucose, Bld: 131 mg/dL — ABNORMAL HIGH (ref 65–99)
Potassium: 4.7 mmol/L (ref 3.5–5.1)
Sodium: 141 mmol/L (ref 135–145)
Total Bilirubin: 1 mg/dL (ref 0.3–1.2)
Total Protein: 6.1 g/dL — ABNORMAL LOW (ref 6.5–8.1)

## 2017-08-16 LAB — URINALYSIS, ROUTINE W REFLEX MICROSCOPIC
Bilirubin Urine: NEGATIVE
Glucose, UA: NEGATIVE mg/dL
Hgb urine dipstick: NEGATIVE
Ketones, ur: NEGATIVE mg/dL
Leukocytes, UA: NEGATIVE
Nitrite: NEGATIVE
Protein, ur: NEGATIVE mg/dL
Specific Gravity, Urine: 1.016 (ref 1.005–1.030)
pH: 6 (ref 5.0–8.0)

## 2017-08-16 LAB — ABO/RH: ABO/RH(D): A POS

## 2017-08-16 LAB — SURGICAL PCR SCREEN
MRSA, PCR: NEGATIVE
Staphylococcus aureus: NEGATIVE

## 2017-08-16 LAB — LIPID PANEL
CHOL/HDL RATIO: 3 ratio
CHOLESTEROL: 127 mg/dL (ref 0–200)
HDL: 43 mg/dL (ref >40)
LDL Cholesterol: 60 mg/dL (ref 0–99)
Triglycerides: 118 mg/dL (ref <150)
VLDL: 24 mg/dL (ref 0–40)

## 2017-08-16 LAB — TROPONIN I
TROPONIN I: 0.25 ng/mL — AB (ref <0.03)
Troponin I: 0.27 ng/mL (ref <0.03)

## 2017-08-16 LAB — POCT ACTIVATED CLOTTING TIME: Activated Clotting Time: 142 seconds

## 2017-08-16 LAB — PROTIME-INR
INR: 1.04
Prothrombin Time: 13.5 seconds (ref 11.4–15.2)

## 2017-08-16 LAB — HEMOGLOBIN A1C
Hgb A1c MFr Bld: 5.5 % (ref 4.8–5.6)
Mean Plasma Glucose: 111.15 mg/dL

## 2017-08-16 LAB — ECHOCARDIOGRAM COMPLETE

## 2017-08-16 LAB — GLUCOSE, CAPILLARY: Glucose-Capillary: 139 mg/dL — ABNORMAL HIGH (ref 65–99)

## 2017-08-16 LAB — PREPARE RBC (CROSSMATCH)

## 2017-08-16 LAB — HEPARIN LEVEL (UNFRACTIONATED): HEPARIN UNFRACTIONATED: 0.23 [IU]/mL — AB (ref 0.30–0.70)

## 2017-08-16 SURGERY — CORONARY ANGIOGRAPHY (CATH LAB)
Anesthesia: LOCAL

## 2017-08-16 MED ORDER — EPINEPHRINE PF 1 MG/ML IJ SOLN: 0.0000 ug/min | INTRAVENOUS | Status: DC

## 2017-08-16 MED ORDER — SODIUM CHLORIDE 0.9 % IV SOLN
750.0000 mg | INTRAVENOUS | Status: DC
  Filled 2017-08-16: qty 750

## 2017-08-16 MED ORDER — ASPIRIN 81 MG PO CHEW
81.0000 mg | CHEWABLE_TABLET | ORAL | Status: AC
  Administered 2017-08-16: 81 mg via ORAL
  Filled 2017-08-16: qty 1

## 2017-08-16 MED ORDER — ALPRAZOLAM 0.25 MG PO TABS: 0.2500 mg | ORAL_TABLET | ORAL | Status: DC | PRN

## 2017-08-16 MED ORDER — ALBUTEROL SULFATE (2.5 MG/3ML) 0.083% IN NEBU: 2.5000 mg | INHALATION_SOLUTION | Freq: Once | RESPIRATORY_TRACT | Status: DC

## 2017-08-16 MED ORDER — PLASMA-LYTE 148 IV SOLN: INTRAVENOUS | Status: DC

## 2017-08-16 MED ORDER — SODIUM CHLORIDE 0.9 % IV SOLN
30.0000 ug/min | INTRAVENOUS | Status: AC
  Administered 2017-08-17: 20 ug/min via INTRAVENOUS
  Filled 2017-08-16: qty 2

## 2017-08-16 MED ORDER — INSULIN REGULAR HUMAN 100 UNIT/ML IJ SOLN
INTRAMUSCULAR | Status: AC
  Administered 2017-08-17: 1 [IU]/h via INTRAVENOUS
  Filled 2017-08-16: qty 1

## 2017-08-16 MED ORDER — HEPARIN (PORCINE) IN NACL 2-0.9 UNITS/ML
INTRAMUSCULAR | Status: AC | PRN
  Administered 2017-08-16 (×2): 500 mL

## 2017-08-16 MED ORDER — MAGNESIUM SULFATE 50 % IJ SOLN: 40.0000 meq | INTRAMUSCULAR | Status: DC

## 2017-08-16 MED ORDER — DEXMEDETOMIDINE HCL IN NACL 400 MCG/100ML IV SOLN
0.1000 ug/kg/h | INTRAVENOUS | Status: DC
  Filled 2017-08-16: qty 100

## 2017-08-16 MED ORDER — VANCOMYCIN HCL 10 G IV SOLR
1250.0000 mg | INTRAVENOUS | Status: DC
  Filled 2017-08-16: qty 1250

## 2017-08-16 MED ORDER — TRANEXAMIC ACID (OHS) BOLUS VIA INFUSION
15.0000 mg/kg | INTRAVENOUS | Status: DC
  Filled 2017-08-16: qty 1605

## 2017-08-16 MED ORDER — LEVOTHYROXINE SODIUM 75 MCG PO TABS
175.0000 ug | ORAL_TABLET | Freq: Every day | ORAL | Status: DC
  Administered 2017-08-16: 175 ug via ORAL
  Filled 2017-08-16: qty 1

## 2017-08-16 MED ORDER — MIDAZOLAM HCL 2 MG/2ML IJ SOLN
INTRAMUSCULAR | Status: AC
  Filled 2017-08-16: qty 2

## 2017-08-16 MED ORDER — SODIUM CHLORIDE 0.9 % IV SOLN
1.5000 g | INTRAVENOUS | Status: DC
  Filled 2017-08-16: qty 1.5

## 2017-08-16 MED ORDER — SODIUM CHLORIDE 0.9 % IV SOLN: 1.5000 g | INTRAVENOUS | Status: DC

## 2017-08-16 MED ORDER — POTASSIUM CHLORIDE 2 MEQ/ML IV SOLN: 80.0000 meq | INTRAVENOUS | Status: DC

## 2017-08-16 MED ORDER — SODIUM CHLORIDE 0.9 % IV SOLN: INTRAVENOUS | Status: DC

## 2017-08-16 MED ORDER — TRANEXAMIC ACID (OHS) BOLUS VIA INFUSION
15.0000 mg/kg | INTRAVENOUS | Status: AC
  Administered 2017-08-17: 1605 mg via INTRAVENOUS
  Filled 2017-08-16: qty 1605

## 2017-08-16 MED ORDER — MAGNESIUM SULFATE 50 % IJ SOLN
40.0000 meq | INTRAMUSCULAR | Status: DC
  Filled 2017-08-16: qty 9.85

## 2017-08-16 MED ORDER — SODIUM CHLORIDE 0.9 % WEIGHT BASED INFUSION
3.0000 mL/kg/h | INTRAVENOUS | Status: DC
  Administered 2017-08-16: 3 mL/kg/h via INTRAVENOUS

## 2017-08-16 MED ORDER — CHLORHEXIDINE GLUCONATE CLOTH 2 % EX PADS: 6.0000 | MEDICATED_PAD | Freq: Once | CUTANEOUS | Status: DC

## 2017-08-16 MED ORDER — MIDAZOLAM HCL 2 MG/2ML IJ SOLN
INTRAMUSCULAR | Status: DC | PRN
  Administered 2017-08-16: 1 mg via INTRAVENOUS
  Administered 2017-08-16: 2 mg via INTRAVENOUS

## 2017-08-16 MED ORDER — SODIUM CHLORIDE 0.9 % IV SOLN: 750.0000 mg | INTRAVENOUS | Status: DC

## 2017-08-16 MED ORDER — TRANEXAMIC ACID (OHS) PUMP PRIME SOLUTION
2.0000 mg/kg | INTRAVENOUS | Status: DC
  Filled 2017-08-16: qty 2.14

## 2017-08-16 MED ORDER — NITROGLYCERIN IN D5W 200-5 MCG/ML-% IV SOLN: 2.0000 ug/min | INTRAVENOUS | Status: DC

## 2017-08-16 MED ORDER — GUAIFENESIN ER 600 MG PO TB12: 600.0000 mg | ORAL_TABLET | Freq: Two times a day (BID) | ORAL | Status: DC

## 2017-08-16 MED ORDER — ASPIRIN 81 MG PO CHEW: 81.0000 mg | CHEWABLE_TABLET | Freq: Every day | ORAL | Status: DC

## 2017-08-16 MED ORDER — VANCOMYCIN HCL 10 G IV SOLR
1250.0000 mg | INTRAVENOUS | Status: AC
  Administered 2017-08-17: 1250 mg via INTRAVENOUS
  Filled 2017-08-16: qty 1250

## 2017-08-16 MED ORDER — FENTANYL CITRATE (PF) 100 MCG/2ML IJ SOLN
INTRAMUSCULAR | Status: DC | PRN
  Administered 2017-08-16: 25 ug via INTRAVENOUS
  Administered 2017-08-16: 50 ug via INTRAVENOUS

## 2017-08-16 MED ORDER — SODIUM CHLORIDE 0.9 % IV SOLN
30.0000 ug/min | INTRAVENOUS | Status: DC
  Filled 2017-08-16: qty 2

## 2017-08-16 MED ORDER — SODIUM CHLORIDE 0.9 % IV SOLN: 250.0000 mL | INTRAVENOUS | Status: DC | PRN

## 2017-08-16 MED ORDER — FENTANYL CITRATE (PF) 100 MCG/2ML IJ SOLN
INTRAMUSCULAR | Status: AC
  Filled 2017-08-16: qty 2

## 2017-08-16 MED ORDER — ALPRAZOLAM 0.25 MG PO TABS
0.2500 mg | ORAL_TABLET | ORAL | Status: DC | PRN
  Administered 2017-08-17: 0.5 mg via ORAL
  Filled 2017-08-16: qty 2

## 2017-08-16 MED ORDER — CHLORHEXIDINE GLUCONATE CLOTH 2 % EX PADS
6.0000 | MEDICATED_PAD | Freq: Once | CUTANEOUS | Status: AC
  Administered 2017-08-16: 6 via TOPICAL

## 2017-08-16 MED ORDER — ATORVASTATIN CALCIUM 80 MG PO TABS
80.0000 mg | ORAL_TABLET | Freq: Every day | ORAL | Status: DC
  Administered 2017-08-16: 80 mg via ORAL
  Filled 2017-08-16: qty 1

## 2017-08-16 MED ORDER — HEPARIN (PORCINE) IN NACL 1000-0.9 UT/500ML-% IV SOLN
INTRAVENOUS | Status: AC
  Filled 2017-08-16: qty 1000

## 2017-08-16 MED ORDER — ASPIRIN 81 MG PO CHEW: 81.0000 mg | CHEWABLE_TABLET | ORAL | Status: DC

## 2017-08-16 MED ORDER — CEFUROXIME SODIUM 1.5 G IV SOLR
1.5000 g | INTRAVENOUS | Status: AC
  Administered 2017-08-17: 1.5 g via INTRAVENOUS
  Filled 2017-08-16: qty 1.5

## 2017-08-16 MED ORDER — TRANEXAMIC ACID (OHS) BOLUS VIA INFUSION: 15.0000 mg/kg | INTRAVENOUS | Status: DC

## 2017-08-16 MED ORDER — POTASSIUM CHLORIDE 2 MEQ/ML IV SOLN
80.0000 meq | INTRAVENOUS | Status: DC
  Filled 2017-08-16: qty 40

## 2017-08-16 MED ORDER — SODIUM CHLORIDE 0.9 % IV SOLN
INTRAVENOUS | Status: DC
  Filled 2017-08-16: qty 30

## 2017-08-16 MED ORDER — CHLORHEXIDINE GLUCONATE CLOTH 2 % EX PADS
6.0000 | MEDICATED_PAD | Freq: Once | CUTANEOUS | Status: AC
  Administered 2017-08-17: 6 via TOPICAL

## 2017-08-16 MED ORDER — EPINEPHRINE PF 1 MG/ML IJ SOLN
0.0000 ug/min | INTRAVENOUS | Status: DC
  Filled 2017-08-16: qty 4

## 2017-08-16 MED ORDER — PERFLUTREN LIPID MICROSPHERE
1.0000 mL | INTRAVENOUS | Status: AC | PRN
  Administered 2017-08-16: 2 mL via INTRAVENOUS
  Filled 2017-08-16: qty 10

## 2017-08-16 MED ORDER — DIAZEPAM 5 MG PO TABS: 5.0000 mg | ORAL_TABLET | Freq: Once | ORAL | Status: DC

## 2017-08-16 MED ORDER — METOPROLOL TARTRATE 12.5 MG HALF TABLET: 12.5000 mg | ORAL_TABLET | Freq: Once | ORAL | Status: DC

## 2017-08-16 MED ORDER — SODIUM CHLORIDE 0.9% FLUSH: 3.0000 mL | Freq: Two times a day (BID) | INTRAVENOUS | Status: DC

## 2017-08-16 MED ORDER — SODIUM CHLORIDE 0.9 % IV SOLN
1.5000 mg/kg/h | INTRAVENOUS | Status: DC
  Filled 2017-08-16: qty 25

## 2017-08-16 MED ORDER — ONDANSETRON HCL 4 MG/2ML IJ SOLN: 4.0000 mg | Freq: Four times a day (QID) | INTRAMUSCULAR | Status: DC | PRN

## 2017-08-16 MED ORDER — TRANEXAMIC ACID (OHS) PUMP PRIME SOLUTION: 2.0000 mg/kg | INTRAVENOUS | Status: DC

## 2017-08-16 MED ORDER — BISACODYL 5 MG PO TBEC: 5.0000 mg | DELAYED_RELEASE_TABLET | Freq: Once | ORAL | Status: DC

## 2017-08-16 MED ORDER — CHLORHEXIDINE GLUCONATE 0.12 % MT SOLN
15.0000 mL | Freq: Once | OROMUCOSAL | Status: AC
  Administered 2017-08-17: 15 mL via OROMUCOSAL
  Filled 2017-08-16: qty 15

## 2017-08-16 MED ORDER — METOPROLOL TARTRATE 12.5 MG HALF TABLET
12.5000 mg | ORAL_TABLET | Freq: Once | ORAL | Status: AC
  Administered 2017-08-17: 12.5 mg via ORAL
  Filled 2017-08-16: qty 1

## 2017-08-16 MED ORDER — DOPAMINE-DEXTROSE 3.2-5 MG/ML-% IV SOLN
0.0000 ug/kg/min | INTRAVENOUS | Status: AC
  Administered 2017-08-17: 2.5 ug/kg/min via INTRAVENOUS
  Filled 2017-08-16 (×2): qty 250

## 2017-08-16 MED ORDER — MOMETASONE FURO-FORMOTEROL FUM 200-5 MCG/ACT IN AERO: 2.0000 | INHALATION_SPRAY | Freq: Two times a day (BID) | RESPIRATORY_TRACT | Status: DC

## 2017-08-16 MED ORDER — DEXMEDETOMIDINE HCL IN NACL 400 MCG/100ML IV SOLN
0.1000 ug/kg/h | INTRAVENOUS | Status: AC
  Administered 2017-08-17: 0.7 ug/kg/h via INTRAVENOUS
  Filled 2017-08-16: qty 100

## 2017-08-16 MED ORDER — LIDOCAINE HCL (PF) 1 % IJ SOLN
INTRAMUSCULAR | Status: DC | PRN
  Administered 2017-08-16: 15 mL

## 2017-08-16 MED ORDER — SODIUM CHLORIDE 0.9% FLUSH: 3.0000 mL | INTRAVENOUS | Status: DC | PRN

## 2017-08-16 MED ORDER — ACETAMINOPHEN 325 MG PO TABS: 650.0000 mg | ORAL_TABLET | ORAL | Status: DC | PRN

## 2017-08-16 MED ORDER — HEPARIN (PORCINE) IN NACL 100-0.45 UNIT/ML-% IJ SOLN: 1400.0000 [IU]/h | INTRAMUSCULAR | Status: DC

## 2017-08-16 MED ORDER — ZOLPIDEM TARTRATE 5 MG PO TABS: 5.0000 mg | ORAL_TABLET | Freq: Every evening | ORAL | Status: DC | PRN

## 2017-08-16 MED ORDER — TRANEXAMIC ACID 1000 MG/10ML IV SOLN: 1.5000 mg/kg/h | INTRAVENOUS | Status: DC

## 2017-08-16 MED ORDER — DIAZEPAM 5 MG PO TABS: 5.0000 mg | ORAL_TABLET | Freq: Four times a day (QID) | ORAL | Status: DC | PRN

## 2017-08-16 MED ORDER — VANCOMYCIN HCL 10 G IV SOLR: 1250.0000 mg | INTRAVENOUS | Status: DC

## 2017-08-16 MED ORDER — SODIUM CHLORIDE 0.9 % IV SOLN: 30.0000 ug/min | INTRAVENOUS | Status: DC

## 2017-08-16 MED ORDER — PLASMA-LYTE 148 IV SOLN
INTRAVENOUS | Status: DC
  Filled 2017-08-16: qty 2.5

## 2017-08-16 MED ORDER — CHLORHEXIDINE GLUCONATE 0.12 % MT SOLN: 15.0000 mL | Freq: Once | OROMUCOSAL | Status: DC

## 2017-08-16 MED ORDER — SODIUM CHLORIDE 0.9 % IV SOLN
INTRAVENOUS | Status: DC
  Filled 2017-08-16: qty 1

## 2017-08-16 MED ORDER — MILRINONE LACTATE IN DEXTROSE 20-5 MG/100ML-% IV SOLN
0.3750 ug/kg/min | INTRAVENOUS | Status: AC
  Administered 2017-08-17: .25 ug/kg/min via INTRAVENOUS
  Filled 2017-08-16 (×3): qty 100

## 2017-08-16 MED ORDER — DEXMEDETOMIDINE HCL IN NACL 400 MCG/100ML IV SOLN: 0.1000 ug/kg/h | INTRAVENOUS | Status: DC

## 2017-08-16 MED ORDER — LIDOCAINE HCL (PF) 1 % IJ SOLN
INTRAMUSCULAR | Status: AC
  Filled 2017-08-16: qty 30

## 2017-08-16 MED ORDER — TEMAZEPAM 15 MG PO CAPS
15.0000 mg | ORAL_CAPSULE | Freq: Once | ORAL | Status: AC | PRN
  Administered 2017-08-16: 15 mg via ORAL
  Filled 2017-08-16: qty 1

## 2017-08-16 MED ORDER — TEMAZEPAM 15 MG PO CAPS: 15.0000 mg | ORAL_CAPSULE | Freq: Once | ORAL | Status: DC | PRN

## 2017-08-16 MED ORDER — HEPARIN SODIUM (PORCINE) 1000 UNIT/ML IJ SOLN
INTRAMUSCULAR | Status: DC | PRN
  Administered 2017-08-16: 4000 [IU] via INTRAVENOUS

## 2017-08-16 MED ORDER — GUAIFENESIN ER 600 MG PO TB12
600.0000 mg | ORAL_TABLET | Freq: Two times a day (BID) | ORAL | Status: DC
  Administered 2017-08-16: 600 mg via ORAL
  Filled 2017-08-16 (×2): qty 1

## 2017-08-16 MED ORDER — HEPARIN BOLUS VIA INFUSION
2000.0000 [IU] | Freq: Once | INTRAVENOUS | Status: AC
  Administered 2017-08-16: 2000 [IU] via INTRAVENOUS
  Filled 2017-08-16: qty 2000

## 2017-08-16 MED ORDER — DOPAMINE-DEXTROSE 3.2-5 MG/ML-% IV SOLN: 0.0000 ug/kg/min | INTRAVENOUS | Status: DC

## 2017-08-16 MED ORDER — MOMETASONE FURO-FORMOTEROL FUM 200-5 MCG/ACT IN AERO
2.0000 | INHALATION_SPRAY | Freq: Two times a day (BID) | RESPIRATORY_TRACT | Status: DC
  Administered 2017-08-16: 2 via RESPIRATORY_TRACT
  Filled 2017-08-16 (×2): qty 8.8

## 2017-08-16 MED ORDER — SODIUM CHLORIDE 0.9 % WEIGHT BASED INFUSION
1.0000 mL/kg/h | INTRAVENOUS | Status: DC
  Administered 2017-08-16: 1 mL/kg/h via INTRAVENOUS

## 2017-08-16 MED ORDER — IOHEXOL 350 MG/ML SOLN
INTRAVENOUS | Status: DC | PRN
  Administered 2017-08-16: 100 mL

## 2017-08-16 MED ORDER — NITROGLYCERIN IN D5W 200-5 MCG/ML-% IV SOLN
2.0000 ug/min | INTRAVENOUS | Status: DC
  Filled 2017-08-16: qty 250

## 2017-08-16 SURGICAL SUPPLY — 19 items
CATH INFINITI 4FR 145 PIGTAIL (CATHETERS) ×1 IMPLANT
CATH INFINITI 5FR AL1 (CATHETERS) ×1 IMPLANT
CATH INFINITI 5FR JL5 (CATHETERS) ×1 IMPLANT
CATH INFINITI 5FR MULTPACK ANG (CATHETERS) ×2 IMPLANT
ELECT DEFIB PAD ADLT CADENCE (PAD) ×1 IMPLANT
KIT HEART LEFT (KITS) ×2 IMPLANT
NDL PERC 21GX4CM (NEEDLE) IMPLANT
NEEDLE PERC 21GX4CM (NEEDLE) ×2 IMPLANT
PACK CARDIAC CATHETERIZATION (CUSTOM PROCEDURE TRAY) ×2 IMPLANT
SHEATH PINNACLE 5F 10CM (SHEATH) ×1 IMPLANT
SHEATH PINNACLE 6F 10CM (SHEATH) ×1 IMPLANT
SHEATH RAIN RADIAL 21G 6FR (SHEATH) ×1 IMPLANT
SYR MEDRAD MARK V 150ML (SYRINGE) ×2 IMPLANT
TRANSDUCER W/STOPCOCK (MISCELLANEOUS) ×2 IMPLANT
TUBING CIL FLEX 10 FLL-RA (TUBING) ×2 IMPLANT
TUBING CONTRAST HIGH PRESS 20 (MISCELLANEOUS) ×1 IMPLANT
WIRE EMERALD 3MM-J .035X150CM (WIRE) ×1 IMPLANT
WIRE EMERALD ST .035X150CM (WIRE) ×1 IMPLANT
WIRE EMERALD ST .035X260CM (WIRE) ×1 IMPLANT

## 2017-08-16 NOTE — ED Notes (Signed)
Paged MD for troponin l to be drawn

## 2017-08-16 NOTE — ED Notes (Signed)
Per MD Allyson Sabal with cardiology, patient is cleared to eat a small amount of food this morning at this time and then to immediately return to NPO status for cath this afternoon.

## 2017-08-16 NOTE — Progress Notes (Signed)
ANTICOAGULATION CONSULT NOTE - Follow Up Consult  Pharmacy Consult for heparin Indication: chest pain/ACS  Labs: Recent Labs    08/15/17 1840 08/16/17 0113 08/16/17 0432  HGB 15.9  --  14.9  HCT 48.7  --  46.0  PLT 144*  --  128*  HEPARINUNFRC  --   --  0.23*  CREATININE 0.98  --   --   TROPONINI  --  0.27*  --     Assessment: 77yo male subtherapeutic on heparin with initial dosing for CP.  Goal of Therapy:  Heparin level 0.3-0.7 units/ml   Plan:  Will rebolus with 2000 units and increase heparin gtt by 2 units/kg/hr to 1400 units/hr and check level in 8 hours.    Vernard Gambles, PharmD, BCPS  08/16/2017,6:06 AM

## 2017-08-16 NOTE — H&P (View-Only) (Signed)
 Progress Note  Patient Name: Jordan Hebert Date of Encounter: 08/16/2017  Primary Cardiologist: Jonathan Berry, MD - new  Subjective   No chest pain  Inpatient Medications    Scheduled Meds: . aspirin  324 mg Oral Daily  . aspirin  324 mg Oral NOW   Or  . aspirin  300 mg Rectal NOW  . aspirin EC  81 mg Oral Daily  . atorvastatin  40 mg Oral Daily  . levothyroxine  175 mcg Oral QAC breakfast  . vitamin B-12  1,000 mcg Oral Daily   Continuous Infusions: . sodium chloride 125 mL/hr at 08/16/17 0252  . heparin 1,400 Units/hr (08/16/17 0613)   PRN Meds: acetaminophen, acetaminophen, nitroGLYCERIN, nitroGLYCERIN, ondansetron (ZOFRAN) IV, ondansetron (ZOFRAN) IV   Vital Signs    Vitals:   08/16/17 0400 08/16/17 0500 08/16/17 0600 08/16/17 0700  BP: 140/88 115/86 131/85 133/83  Pulse: 75 77 76 80  Resp: 16 14 18 16  Temp:      TempSrc:      SpO2: 97% 99% 99% 99%   No intake or output data in the 24 hours ending 08/16/17 0729 There were no vitals filed for this visit.  Telemetry    sinus - Personally Reviewed  ECG    ST depression V4/5/6 - Personally Reviewed  Physical Exam   GEN: No acute distress.   Neck: No JVD Cardiac: RRR, + murmur, no rubs, or gallops.  Respiratory: Clear to auscultation bilaterally. GI: Soft, nontender, non-distended  MS: No edema; No deformity. Neuro:  Nonfocal  Psych: Normal affect   Labs    Chemistry Recent Labs  Lab 08/15/17 1840  NA 141  K 4.1  CL 101  CO2 27  GLUCOSE 131*  BUN 16  CREATININE 0.98  CALCIUM 9.2  GFRNONAA >60  GFRAA >60  ANIONGAP 13     Hematology Recent Labs  Lab 08/15/17 1840 08/16/17 0432  WBC 7.9 8.4  RBC 4.92 4.61  HGB 15.9 14.9  HCT 48.7 46.0  MCV 99.0 99.8  MCH 32.3 32.3  MCHC 32.6 32.4  RDW 13.6 13.7  PLT 144* 128*    Cardiac Enzymes Recent Labs  Lab 08/16/17 0113  TROPONINI 0.27*    Recent Labs  Lab 08/15/17 1853  TROPIPOC 0.33*     BNPNo results for input(s):  BNP, PROBNP in the last 168 hours.   DDimer No results for input(s): DDIMER in the last 168 hours.   Radiology    Dg Chest 2 View  Result Date: 08/15/2017 CLINICAL DATA:  Chest pain EXAM: CHEST - 2 VIEW COMPARISON:  01/26/2013 FINDINGS: Mild increased bronchitic changes. No consolidation or effusion. Stable cardiomediastinal silhouette. No pneumothorax. IMPRESSION: Slight increased bronchitic changes. Otherwise no significant acute interval changes. Electronically Signed   By: Kim  Fujinaga M.D.   On: 08/15/2017 19:51    Cardiac Studies   none  Patient Profile     77 y.o. male with PMH significant for CAD with hx of stents 22 years ago, HLD, rheumatic fever, hypothyroidism, lumbar spinal stenosis, and depression. He presented to [facility] with onset of chest pain found to have a NSTEMI.   Assessment & Plan    1. NSTEMI, CAD s/p 3 stents 22 years ago - troponin 0.33 --> 0.27 - EKG with ST depression V4/5/6 - chest pain was relieved at home with ASA, ibuprofen, and a muscle relaxer.  - chest pain worse with movement and exercise, worsening last night and radiation to his jaw -   pt has symptoms concerning for unstable angina - troponin mildly elevated - he is a current everyday smoker, no DM or HTN - will plan for heart catheterization today   2. HLD - will obtain lipid panel - home: lipitor 40 mg     For questions or updates, please contact [facility] Please consult [email or web address] for contact info under Cardiology/STEMI.      Signed, Roe Rutherford Duke, PA  08/16/2017, 7:29 AM     Agree with note by Micah Flesher PA-C  77 year old gentleman with remote stenting 22 years ago, positive risk factors and new onset accelerated symptoms consistent with unstable angina.  He has mildly elevated enzymes.  EKG is abnormal with ST segment depression.  There is a question of rheumatic heart disease/rheumatic fever as a child but he does have a 2/6 outflow tract murmur consistent with  aortic stenosis and/or sclerosis.  He is on IV heparin currently.  Plan is for diagnostic coronary angiography later today.The patient understands that risks included but are not limited to stroke (1 in 1000), death (1 in 1000), kidney failure [usually temporary] (1 in 500), bleeding (1 in 200), allergic reaction [possibly serious] (1 in 200). The patient understands and agrees to proceed  Runell Gess, M.D., FACP, [facility], [facility]ne Eriksson [facility] [address] [place], [place]  [address]  [phone] 08/16/2017 8:12 AM

## 2017-08-16 NOTE — Progress Notes (Signed)
ANTICOAGULATION CONSULT NOTE - Follow Up Consult  Pharmacy Consult for heparin Indication: chest pain/ACS  Labs: Recent Labs    08/15/17 1840 08/16/17 0113 08/16/17 0432 08/16/17 0642  HGB 15.9  --  14.9  --   HCT 48.7  --  46.0  --   PLT 144*  --  128*  --   HEPARINUNFRC  --   --  0.23*  --   CREATININE 0.98  --   --   --   TROPONINI  --  0.27*  --  0.25*    Assessment: 77yo male continues on heparin post cath this afternoon. 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 CVTS has been consulted for possible CABG/AVR in the next 24 hours. Sheath sutured in place and heparin started in cath lab and to continue until stopped on call to OR in am.   Goal of Therapy:  Heparin level 0.3-0.7 units/ml   Plan:  Heparin restart at 1400 units/hr Recheck heparin level tonight.   Sheppard Coil PharmD., BCPS Clinical Pharmacist 08/16/2017 6:25 PM

## 2017-08-16 NOTE — Progress Notes (Signed)
  Echocardiogram 2D Echocardiogram has been performed.  Jordan Hebert L Androw 08/16/2017, 4:35 PM

## 2017-08-16 NOTE — Progress Notes (Signed)
Pre-CABG testing has been completed. 1-39% ICA stenosis bilaterally.  ABI's Right 1.2 Left 1.27  08/16/17 5:28 PM Olen Cordial RVT

## 2017-08-16 NOTE — Progress Notes (Addendum)
Progress Note  Patient Name: Jordan Hebert Date of Encounter: 08/16/2017  Primary Cardiologist: Nanetta Batty, MD - new  Subjective   No chest pain  Inpatient Medications    Scheduled Meds: . aspirin  324 mg Oral Daily  . aspirin  324 mg Oral NOW   Or  . aspirin  300 mg Rectal NOW  . aspirin EC  81 mg Oral Daily  . atorvastatin  40 mg Oral Daily  . levothyroxine  175 mcg Oral QAC breakfast  . vitamin B-12  1,000 mcg Oral Daily   Continuous Infusions: . sodium chloride 125 mL/hr at 08/16/17 0252  . heparin 1,400 Units/hr (08/16/17 0613)   PRN Meds: acetaminophen, acetaminophen, nitroGLYCERIN, nitroGLYCERIN, ondansetron (ZOFRAN) IV, ondansetron (ZOFRAN) IV   Vital Signs    Vitals:   08/16/17 0400 08/16/17 0500 08/16/17 0600 08/16/17 0700  BP: 140/88 115/86 131/85 133/83  Pulse: 75 77 76 80  Resp: Temp:      TempSrc:      SpO2: 97% 99% 99% 99%   No intake or output data in the 24 hours ending 08/16/17 0729 There were no vitals filed for this visit.  Telemetry    sinus - Personally Reviewed  ECG    ST depression V4/5/6 - Personally Reviewed  Physical Exam   GEN: No acute distress.   Neck: No JVD Cardiac: RRR, + murmur, no rubs, or gallops.  Respiratory: Clear to auscultation bilaterally. GI: Soft, nontender, non-distended  MS: No edema; No deformity. Neuro:  Nonfocal  Psych: Normal affect   Labs    Chemistry Recent Labs  Lab 08/15/17 1840  NA 141  K 4.1  CL 101  CO2 27  GLUCOSE 131*  BUN 16  CREATININE 0.98  CALCIUM 9.2  GFRNONAA >60  GFRAA >60  ANIONGAP 13     Hematology Recent Labs  Lab 08/15/17 1840 08/16/17 0432  WBC 7.9 8.4  RBC 4.92 4.61  HGB 15.9 14.9  HCT 48.7 46.0  MCV 99.0 99.8  MCH 32.3 32.3  MCHC 32.6 32.4  RDW 13.6 13.7  PLT 144* 128*    Cardiac Enzymes Recent Labs  Lab 08/16/17 0113  TROPONINI 0.27*    Recent Labs  Lab 08/15/17 1853  TROPIPOC 0.33*     BNPNo results for input(s):  BNP, PROBNP in the last 168 hours.   DDimer No results for input(s): DDIMER in the last 168 hours.   Radiology    Dg Chest 2 View  Result Date: 08/15/2017 CLINICAL DATA:  Chest pain EXAM: CHEST - 2 VIEW COMPARISON:  01/26/2013 FINDINGS: Mild increased bronchitic changes. No consolidation or effusion. Stable cardiomediastinal silhouette. No pneumothorax. IMPRESSION: Slight increased bronchitic changes. Otherwise no significant acute interval changes. Electronically Signed   By: Jasmine Pang M.D.   On: 08/15/2017 19:51    Cardiac Studies   none  Patient Profile     77 y.o. male with PMH significant for CAD with hx of stents 22 years ago, HLD, rheumatic fever, hypothyroidism, lumbar spinal stenosis, and depression. He presented to [facility] with onset of chest pain found to have a NSTEMI.   Assessment & Plan    1. NSTEMI, CAD s/p 3 stents 22 years ago - troponin 0.33 --> 0.27 - EKG with ST depression V4/5/6 - chest pain was relieved at home with ASA, ibuprofen, and a muscle relaxer.  - chest pain worse with movement and exercise, worsening last night and radiation to his jaw -  pt has symptoms concerning for unstable angina - troponin mildly elevated - he is a current everyday smoker, no DM or HTN - will plan for heart catheterization today   2. HLD - will obtain lipid panel - home: lipitor 40 mg     For questions or updates, please contact [facility] Please consult [email or web address] for contact info under Cardiology/STEMI.      Signed, Roe Rutherford Duke, PA  08/16/2017, 7:29 AM     Agree with note by Micah Flesher PA-C  77 year old gentleman with remote stenting 22 years ago, positive risk factors and new onset accelerated symptoms consistent with unstable angina.  He has mildly elevated enzymes.  EKG is abnormal with ST segment depression.  There is a question of rheumatic heart disease/rheumatic fever as a child but he does have a 2/6 outflow tract murmur consistent with  aortic stenosis and/or sclerosis.  He is on IV heparin currently.  Plan is for diagnostic coronary angiography later today.The patient understands that risks included but are not limited to stroke (1 in 1000), death (1 in 1000), kidney failure [usually temporary] (1 in 500), bleeding (1 in 200), allergic reaction [possibly serious] (1 in 200). The patient understands and agrees to proceed  Runell Gess, M.D., FACP, [facility], [facility] [address] [place], [place]  [address]  [phone] 08/16/2017 8:12 AM

## 2017-08-16 NOTE — Consult Note (Signed)
[address]       [place] [address]             [phone]        Jordan Hebert [facility] Medical Record #[id number] Date of Birth: [birth year]  Referring: Nicki Guadalajara MD Primary Care: Kirby Funk, MD Primary Cardiologist:Jonathan Allyson Sabal, MD  Chief Complaint:    Chief Complaint  Patient presents with  . Chest Pain  Patient examined, coronary arteriograms reviewed with Dr. Tresa Endo in cardiac Cath Lab for coordination of care.    History of Present Illness:     77 year old obese Caucasian male nondiabetic smoker admitted to the hospital last night with non-STEMI.  Symptoms included chest pain over the past several days consistent with unstable angina. Patient has past history of coronary artery disease status post stent to the LAD and RCA 22 years ago.  Patient was found to have ST segment changes in the ED as well as an elevated troponin of 0.8 and was admitted and placed on heparin and scheduled for cardiac catheterization.  On heparin his chest pain had resolved.  Dr. Tresa Endo performed left heart cath today demonstrating a severe left main stenosis with ulcerated plaque, chronic occlusion of the circumflex which fills via collaterals from the distal RCA, and 70% stenosis of the proximal  RCA.  LV gram was not performed because the aortic valve could not be crossed and may be stenotic.  Echocardiogram is pending to evaluate the aortic valve and LV function.  The femoral sheath was left in place following catheterization so the patient's heparin can be continued.  He did not develop chest pain or any hemodynamic instability or worrisome EKG changes during the cardiac cath procedure.  Dr. Tresa Endo has recommended urgent coronary artery bypass grafting with possible combined AVR within the next 24 hours..  I agree with that recommendation and have discussed the plan for surgery with the patient who agrees to proceed.  The patient remained on IV heparin until surgery.   He will have echocardiogram, pre-CABG Dopplers, and surgical PCR screen.  Current Activity/ Functional Status: Patient is retired from Airline pilot at [employer] but remains fairly active.   Zubrod Score: At the time of surgery this patient's most appropriate activity status/level should be described as:     0    Normal activity, no symptoms     1    Restricted in physical strenuous activity but ambulatory, able to do out light work     2    Ambulatory and capable of self care, unable to do work activities, up and about                 more than 50%  Of the time                                3    Only limited self care, in bed greater than 50% of waking hours     4    Completely disabled, no self care, confined to bed or chair     5    Moribund  Past Medical History:  Diagnosis Date  . Back pain   . Colon polyps   . Degenerative lumbar spinal stenosis   . Depression   . Erectile dysfunction   . Gynecomastia   . High cholesterol   . Hyperthyroidism   . Rheumatic fever   . Seborrhea   .  Umbilical hernia     Past Surgical History:  Procedure Laterality Date  . CORONARY STENT PLACEMENT      Social History   Tobacco Use  Smoking Status Current Every Day Smoker  Smokeless Tobacco Never Used    Social History   Substance and Sexual Activity  Alcohol Use Yes     No Known Allergies  Current Facility-Administered Medications  Medication Dose Route Frequency Provider Last Rate Last Dose  . 0.9 %  sodium chloride infusion   Intravenous Continuous Lynwood Dawley, MD   Stopped at 08/16/17 [phone]  . 0.9 %  sodium chloride infusion  250 mL Intravenous PRN Marcelino Duster, PA      . 0.9% sodium chloride infusion  1 mL/kg/hr Intravenous Continuous Marcelino Duster, PA   1 mL/kg/hr at 08/16/17 1045  . [MAR Hold] acetaminophen (TYLENOL) tablet 650 mg  650 mg Oral Q4H PRN Lynwood Dawley, MD      . ALPRAZolam Prudy Feeler) tablet 0.25-0.5 mg  0.25-0.5 mg Oral Q4H PRN Donata Clay,  Theron Arista, MD      . Mitzi Hansen Hold] atorvastatin (LIPITOR) tablet 40 mg  40 mg Oral Daily Vedre, Ameeth, MD      . bisacodyl (DULCOLAX) EC tablet 5 mg  5 mg Oral Once Donata Clay, Theron Arista, MD      . Melene Muller ON 08/17/2017] cefUROXime (ZINACEF) 750 mg in sodium chloride 0.9 % 100 mL IVPB  750 mg Intravenous To OR Donata Clay, Theron Arista, MD      . Melene Muller ON 08/17/2017] chlorhexidine (PERIDEX) 0.12 % solution 15 mL  15 mL Mouth/Throat Once Kerin Perna, MD      . Chlorhexidine Gluconate Cloth 2 % PADS 6 each  6 each Topical Once Kerin Perna, MD       And  . Chlorhexidine Gluconate Cloth 2 % PADS 6 each  6 each Topical Once Donata Clay, Theron Arista, MD      . Melene Muller ON 08/17/2017] dexmedetomidine (PRECEDEX) 400 MCG/100ML (4 mcg/mL) infusion  0.1-0.7 mcg/kg/hr Intravenous To OR Donata Clay, Theron Arista, MD      . Melene Muller ON 08/17/2017] diazepam (VALIUM) tablet 5 mg  5 mg Oral Once Donata Clay, Theron Arista, MD      . Melene Muller ON 08/17/2017] DOPamine (INTROPIN) 800 mg in dextrose 5 % 250 mL (3.2 mg/mL) infusion  0-10 mcg/kg/min Intravenous To OR Donata Clay, Theron Arista, MD      . Melene Muller ON 08/17/2017] EPINEPHrine (ADRENALIN) 4 mg in dextrose 5 % 250 mL (0.016 mg/mL) infusion  0-10 mcg/min Intravenous To OR Donata Clay, Theron Arista, MD      . guaiFENesin [facility]) 12 hr tablet 600 mg  600 mg Oral BID Donata Clay, Theron Arista, MD      . Melene Muller ON 08/17/2017] heparin 2,500 Units, papaverine 30 mg in electrolyte-148 (PLASMALYTE-148) 500 mL irrigation   Irrigation To OR Donata Clay, Theron Arista, MD      . Melene Muller ON 08/17/2017] heparin 30,000 units/NS 1000 mL solution for CELLSAVER   Other To OR Donata Clay, Theron Arista, MD      . heparin ADULT infusion 100 units/mL (25000 units/220mL sodium chloride 0.45%)  1,400 Units/hr Intravenous Continuous Juliette Mangle, RPH 14 mL/hr at 08/16/17 1519 1,400 Units/hr at 08/16/17 1519  . [START ON 08/17/2017] insulin regular (NOVOLIN R,HUMULIN R) 100 Units in sodium chloride 0.9 % 100 mL (1 Units/mL) infusion   Intravenous To OR Donata Clay, Theron Arista, MD      .  Mitzi Hansen Hold] levothyroxine (SYNTHROID, LEVOTHROID) tablet 175 mcg  175 mcg Oral QAC breakfast Lynwood Dawley, MD   Stopped at 08/16/17 [phone]  . [START ON 08/17/2017] magnesium sulfate (IV Push/IM) injection 40 mEq  40 mEq Other To OR Donata Clay, Theron Arista, MD      . Melene Muller ON 08/17/2017] metoprolol tartrate (LOPRESSOR) tablet 12.5 mg  12.5 mg Oral Once Donata Clay, Theron Arista, MD      . mometasone-formoterol [facility]) 200-5 MCG/ACT inhaler 2 puff  2 puff Inhalation BID Donata Clay, Theron Arista, MD      . Mitzi Hansen Hold] nitroGLYCERIN (NITROSTAT) SL tablet 0.4 mg  0.4 mg Sublingual Q5 Min x 3 PRN Lynwood Dawley, MD   0.4 mg at 08/16/17 0002  . [START ON 08/17/2017] nitroGLYCERIN 50 mg in dextrose 5 % 250 mL (0.2 mg/mL) infusion  2-200 mcg/min Intravenous To OR Donata Clay, Theron Arista, MD      . Mitzi Hansen Hold] ondansetron [facility]) injection 4 mg  4 mg Intravenous Q6H PRN Lynwood Dawley, MD      . Melene Muller ON 08/17/2017] phenylephrine (NEO-SYNEPHRINE) 20 mg in sodium chloride 0.9 % 250 mL (0.08 mg/mL) infusion  30-200 mcg/min Intravenous To OR Donata Clay, Theron Arista, MD      . Melene Muller ON 08/17/2017] potassium chloride injection 80 mEq  80 mEq Other To OR Donata Clay, Theron Arista, MD      . sodium chloride flush (NS) 0.9 % injection 3 mL  3 mL Intravenous Q12H Duke, Roe Rutherford, PA      . sodium chloride flush (NS) 0.9 % injection 3 mL  3 mL Intravenous PRN Duke, Roe Rutherford, PA      . temazepam (RESTORIL) capsule 15 mg  15 mg Oral Once PRN Donata Clay, Theron Arista, MD      . Melene Muller ON 08/17/2017] tranexamic acid (CYKLOKAPRON) 2,500 mg in sodium chloride 0.9 % 250 mL (10 mg/mL) infusion  1.5 mg/kg/hr Intravenous To OR Donata Clay, Theron Arista, MD      . Melene Muller ON 08/17/2017] tranexamic acid (CYKLOKAPRON) bolus via infusion - over 30 minutes 15 mg/kg  15 mg/kg Intravenous To OR Donata Clay, Theron Arista, MD      . Melene Muller ON 08/17/2017] tranexamic acid (CYKLOKAPRON) pump prime solution 2 mg/kg  2 mg/kg Intracatheter To OR Donata Clay, Theron Arista, MD      . Mitzi Hansen Hold] vitamin B-12 (CYANOCOBALAMIN)  tablet 1,000 mcg  1,000 mcg Oral Daily Lynwood Dawley, MD   Stopped at 08/16/17 [phone]    Medications Prior to Admission  Medication Sig Dispense Refill Last Dose  . aspirin EC 81 MG tablet Take 81 mg by mouth daily.   08/15/2017 at am  . atorvastatin (LIPITOR) 40 MG tablet Take 40 mg by mouth daily.   08/15/2017 at am  . cyclobenzaprine (FLEXERIL) 10 MG tablet Take 10 mg by mouth 2 (two) times daily as needed for muscle spasms.    couple days ago  . levothyroxine (SYNTHROID, LEVOTHROID) 175 MCG tablet Take 175 mcg by mouth daily before breakfast.   08/15/2017 at am  . naproxen sodium (ALEVE) 220 MG tablet Take 440 mg by mouth 2 (two) times daily as needed (pain).   08/15/2017 at am  . vitamin B-12 (CYANOCOBALAMIN) 1000 MCG tablet Take 1,000 mcg by mouth daily.   08/15/2017 at am    Family History  Problem Relation Age of Onset  . Stroke Mother   . Cancer Brother   . Aneurysm Sister        Cerebral   . Other Son        Guillain-Barre Syndrome  Review of Systems:   ROS      Cardiac Review of Systems: Y or  [    ]= no  Chest Pain [ y   ]  Resting SOB [   ] Exertional SOB  [ y ]  Orthopnea [  ]   Pedal Edema [   ]    Palpitations [  ] Syncope  [  ]   Presyncope [   ]  General Review of Systems: [Y] = yes [  ]=no Constitional: recent weight change [  ]; anorexia [  ]; fatigue Cove.Etienne  ]; nausea [  ]; night sweats [  ]; fever [  ]; or chills [  ]                                                               Dental: Last Dentist visit: unknown.  Patient has upper dental plate and only 3 remaining mandibular teeth.  One is slightly loose but nonpainful  Eye : blurred vision [  ]; diplopia [   ]; vision changes [  ];  Amaurosis fugax[  ]; Resp: cough Cove.Etienne  ];  wheezing[  ];  hemoptysis[  ]; shortness of breath[ y ]; paroxysmal nocturnal dyspnea[  ]; dyspnea on exertion[  ]; or orthopnea[  ];  GI:  gallstones[  ], vomiting[  ];  dysphagia[  ]; melena[  ];  hematochezia [  ]; heartburn[  ];   Hx of   Colonoscopy[  ]; GU: kidney stones [  ]; hematuria[  ];   dysuria [  ];  nocturia[  ];  history of     obstruction [  ]; urinary frequency [  ]             Skin: rash, swelling[  ];, hair loss[  ];  peripheral edema[  ];  or itching[  ]; Musculosketetal: myalgias[  ];  joint swelling[  ];  joint erythema[  ];  joint pain[  ];  back pain[  ];  Heme/Lymph: bruising[  ];  bleeding[  ];  anemia[  ];  Neuro: TIA[  ];  headaches[  ];  stroke[  ];  vertigo[  ];  seizures[  ];   paresthesias[  ];  difficulty walking[  ];  Psych:depression[  ]; anxiety[  ];  Endocrine: diabetes[  ];  thyroid dysfunction[  ];           Right-hand-dominant           No history of thoracic trauma, rib fracture, pneumothorax      Physical Exam: BP 131/90   Pulse 76   Temp 98.1 F (36.7 C) (Oral)   Resp 18   SpO2 100%        Physical Exam  General: Alert responsive obese 77 year old male in the cardiac Cath Lab anxious but in no acute distress HEENT: Normocephalic pupils equal , dentition adequate but left sided mandibular tooth loose Neck: Supple without JVD, adenopathy, or bruit Chest: Clear to auscultation, symmetrical breath sounds, no rhonchi, no tenderness             or deformity Cardiovascular: Regular rate and rhythm, 2/6 systolic murmur, no gallop, peripheral pulses  palpable in all extremities Abdomen:  Soft, nontender, no palpable mass or organomegaly Extremities: Warm, well-perfused, no clubbing cyanosis edema or tenderness, femoral sheath and right groin without bleeding, hematoma              no venous stasis changes of the legs Rectal/GU: Deferred Neuro: Grossly non--focal and symmetrical throughout Skin: Clean and dry without rash or ulceration   Diagnostic Studies & Laboratory data:     Recent Radiology Findings:   Dg Chest 2 View  Result Date: 08/15/2017 CLINICAL DATA:  Chest pain EXAM: CHEST - 2 VIEW COMPARISON:  01/26/2013 FINDINGS: Mild increased bronchitic changes. No  consolidation or effusion. Stable cardiomediastinal silhouette. No pneumothorax. IMPRESSION: Slight increased bronchitic changes. Otherwise no significant acute interval changes. Electronically Signed   By: Jasmine Pang M.D.   On: 08/15/2017 19:51     I have independently reviewed the above radiologic studies and discussed with the patient   Recent Lab Findings: Lab Results  Component Value Date   WBC 8.4 08/16/2017   HGB 14.9 08/16/2017   HCT 46.0 08/16/2017   PLT 128 (L) 08/16/2017   GLUCOSE 131 (H) 08/15/2017   CHOL 127 08/16/2017   TRIG 118 08/16/2017   HDL 43 08/16/2017   LDLCALC 60 08/16/2017   NA 141 08/15/2017   K 4.1 08/15/2017   CL 101 08/15/2017   CREATININE 0.98 08/15/2017   BUN 16 08/15/2017   CO2 27 08/15/2017      Assessment / Plan:   Non-STEMI, severe left main stenosis three-vessel CAD Unstable angina Active smoker 1 pack/day with chronic bronchitis Possible aortic stenosis, echocardiogram pending  Patient will be prepared for multivessel CABG with combined possible AVR tomorrow a.m.  I discussed the procedure with the patient including indications  benefits, alternatives to surgery, and risks.  I will meet with the family to review the patient's condition, his diagnosis and his recommended therapy including CABG.         @ 08/16/2017 4:06 PM

## 2017-08-16 NOTE — Interval H&P Note (Signed)
Cath Lab Visit (complete for each Cath Lab visit)  Clinical Evaluation Leading to the Procedure:   ACS: Yes.    Non-ACS:    Anginal Classification: CCS III  Anti-ischemic medical therapy: Minimal Therapy (1 class of medications)  Non-Invasive Test Results: No non-invasive testing performed  Prior CABG: No previous CABG      History and Physical Interval Note:  08/16/2017 1:47 PM  Jordan Hebert  has presented today for surgery, with the diagnosis of unstable angina  The various methods of treatment have been discussed with the patient and family. After consideration of risks, benefits and other options for treatment, the patient has consented to  Procedure(s): LEFT HEART CATH AND CORONARY ANGIOGRAPHY (N/A) as a surgical intervention .  The patient's history has been reviewed, patient examined, no change in status, stable for surgery.  I have reviewed the patient's chart and labs.  Questions were answered to the patient's satisfaction.     Nicki Guadalajara

## 2017-08-16 NOTE — ED Notes (Signed)
Nurse called to make aware that patient will be going to cath lab after lunch.

## 2017-08-16 NOTE — ED Notes (Signed)
Patient was given a Turkey Sandwich bag. 

## 2017-08-16 NOTE — Progress Notes (Signed)
Patient received from cath lab; alert and oriented x4, RA, Heparin and saline infusing through peripheral IVs. Right femoral sheath dry and intact primed with Heparin. RT notified of a transducer leak, new line obtained and reprimed with saline. Patients only complaint is of hunger and 'he has been waiting for his dinner tray for too long.' Labs sent, will continue to monitor.

## 2017-08-16 NOTE — ED Notes (Signed)
2L of oxygen was placed on patient at Nurse request, because patient 02 was dropping below normal while sleeping.

## 2017-08-16 NOTE — Progress Notes (Signed)
Cardiology note Was notified by RN that patient still has a 6 French femoral arterial sheath in place from catheterization today and is receiving a heparin infusion.  Patient is scheduled for CABG tomorrow.  As there is no plan for repeat catheterization, we will plan to remove this tonight to limit potential ischemic injury to his leg.  Plan --Current ACT is 140 --Stop heparin infusion now --Check ACT in 30 minutes --If ACT is less than 175, remove femoral sheath --6 hours bedrest after sheath removal --Restart heparin without bolus 4 hours after sheath removal  Carlye Grippe, MD

## 2017-08-17 ENCOUNTER — Inpatient Hospital Stay (HOSPITAL_COMMUNITY)
Admission: EM | Disposition: A | Discharge status: Home / Self Care | Payer: Self-pay | Source: Home / Self Care | Attending: Cardiothoracic Surgery

## 2017-08-17 ENCOUNTER — Inpatient Hospital Stay: Admit: 2017-08-17 | Payer: PPO | Admitting: Cardiothoracic Surgery

## 2017-08-17 ENCOUNTER — Inpatient Hospital Stay (HOSPITAL_COMMUNITY): Payer: PPO | Admitting: Anesthesiology

## 2017-08-17 ENCOUNTER — Inpatient Hospital Stay (HOSPITAL_COMMUNITY): Payer: PPO

## 2017-08-17 ENCOUNTER — Encounter (HOSPITAL_COMMUNITY): Payer: Self-pay | Admitting: Cardiovascular Disease

## 2017-08-17 DIAGNOSIS — Z951 Presence of aortocoronary bypass graft: Secondary | ICD-10-CM

## 2017-08-17 HISTORY — PX: AORTIC VALVE REPLACEMENT: SHX41

## 2017-08-17 HISTORY — PX: CORONARY ARTERY BYPASS GRAFT: SHX141

## 2017-08-17 HISTORY — PX: TEE WITHOUT CARDIOVERSION: SHX5443

## 2017-08-17 LAB — BASIC METABOLIC PANEL
Anion gap: 6 (ref 5–15)
BUN: 15 mg/dL (ref 6–20)
CO2: 27 mmol/L (ref 22–32)
Calcium: 9.1 mg/dL (ref 8.9–10.3)
Chloride: 109 mmol/L (ref 101–111)
Creatinine, Ser: 0.92 mg/dL (ref 0.61–1.24)
GFR calc Af Amer: >60 mL/min (ref >60)
GFR calc non Af Amer: >60 mL/min (ref >60)
Glucose, Bld: 110 mg/dL — ABNORMAL HIGH (ref 65–99)
Potassium: 5.3 mmol/L — ABNORMAL HIGH (ref 3.5–5.1)
Sodium: 142 mmol/L (ref 135–145)

## 2017-08-17 LAB — POCT I-STAT 3, ART BLOOD GAS (G3+)
Acid-Base Excess: 3 mmol/L — ABNORMAL HIGH (ref 0.0–2.0)
Acid-base deficit: 1 mmol/L (ref 0.0–2.0)
Acid-base deficit: 2 mmol/L (ref 0.0–2.0)
Bicarbonate: 29.5 mmol/L — ABNORMAL HIGH (ref 20.0–28.0)
Bicarbonate: 24.2 mmol/L (ref 20.0–28.0)
Bicarbonate: 24.5 mmol/L (ref 20.0–28.0)
O2 Saturation: 100 %
O2 Saturation: 100 %
O2 Saturation: 94 %
PCO2 ART: 50.9 mmHg — AB (ref 32.0–48.0)
PCO2 ART: 46.5 mmHg (ref 32.0–48.0)
PH ART: 7.353 (ref 7.350–7.450)
PH ART: 7.323 — AB (ref 7.350–7.450)
PO2 ART: 433 mmHg — AB (ref 83.0–108.0)
PO2 ART: 73 mmHg — AB (ref 83.0–108.0)
Patient temperature: 35.6
TCO2: 31 mmol/L (ref 22–32)
TCO2: 26 mmol/L (ref 22–32)
TCO2: 26 mmol/L (ref 22–32)
pCO2 arterial: 43.6 mmHg (ref 32.0–48.0)
pH, Arterial: 7.371 (ref 7.350–7.450)
pO2, Arterial: 176 mmHg — ABNORMAL HIGH (ref 83.0–108.0)

## 2017-08-17 LAB — POCT I-STAT, CHEM 8
BUN: 15 mg/dL (ref 6–20)
BUN: 13 mg/dL (ref 6–20)
BUN: 14 mg/dL (ref 6–20)
BUN: 14 mg/dL (ref 6–20)
BUN: 13 mg/dL (ref 6–20)
BUN: 12 mg/dL (ref 6–20)
BUN: 13 mg/dL (ref 6–20)
BUN: 13 mg/dL (ref 6–20)
CALCIUM ION: 1.18 mmol/L (ref 1.15–1.40)
CHLORIDE: 104 mmol/L (ref 101–111)
CHLORIDE: 104 mmol/L (ref 101–111)
CREATININE: 0.6 mg/dL — AB (ref 0.61–1.24)
CREATININE: 0.6 mg/dL — AB (ref 0.61–1.24)
CREATININE: 0.6 mg/dL — AB (ref 0.61–1.24)
Calcium, Ion: 1.24 mmol/L (ref 1.15–1.40)
Calcium, Ion: 0.99 mmol/L — ABNORMAL LOW (ref 1.15–1.40)
Calcium, Ion: 1.22 mmol/L (ref 1.15–1.40)
Calcium, Ion: 1.05 mmol/L — ABNORMAL LOW (ref 1.15–1.40)
Calcium, Ion: 1.09 mmol/L — ABNORMAL LOW (ref 1.15–1.40)
Calcium, Ion: 1.07 mmol/L — ABNORMAL LOW (ref 1.15–1.40)
Calcium, Ion: 1.08 mmol/L — ABNORMAL LOW (ref 1.15–1.40)
Chloride: 104 mmol/L (ref 101–111)
Chloride: 103 mmol/L (ref 101–111)
Chloride: 106 mmol/L (ref 101–111)
Chloride: 106 mmol/L (ref 101–111)
Chloride: 103 mmol/L (ref 101–111)
Chloride: 104 mmol/L (ref 101–111)
Creatinine, Ser: 0.6 mg/dL — ABNORMAL LOW (ref 0.61–1.24)
Creatinine, Ser: 0.6 mg/dL — ABNORMAL LOW (ref 0.61–1.24)
Creatinine, Ser: 0.6 mg/dL — ABNORMAL LOW (ref 0.61–1.24)
Creatinine, Ser: 0.8 mg/dL (ref 0.61–1.24)
Creatinine, Ser: 0.7 mg/dL (ref 0.61–1.24)
GLUCOSE: 170 mg/dL — AB (ref 65–99)
GLUCOSE: 149 mg/dL — AB (ref 65–99)
Glucose, Bld: 109 mg/dL — ABNORMAL HIGH (ref 65–99)
Glucose, Bld: 100 mg/dL — ABNORMAL HIGH (ref 65–99)
Glucose, Bld: 113 mg/dL — ABNORMAL HIGH (ref 65–99)
Glucose, Bld: 116 mg/dL — ABNORMAL HIGH (ref 65–99)
Glucose, Bld: 137 mg/dL — ABNORMAL HIGH (ref 65–99)
Glucose, Bld: 185 mg/dL — ABNORMAL HIGH (ref 65–99)
HCT: 41 % (ref 39.0–52.0)
HCT: 39 % (ref 39.0–52.0)
HCT: 40 % (ref 39.0–52.0)
HCT: 30 % — ABNORMAL LOW (ref 39.0–52.0)
HEMATOCRIT: 31 % — AB (ref 39.0–52.0)
HEMATOCRIT: 29 % — AB (ref 39.0–52.0)
HEMATOCRIT: 32 % — AB (ref 39.0–52.0)
HEMATOCRIT: 31 % — AB (ref 39.0–52.0)
HEMOGLOBIN: 13.3 g/dL (ref 13.0–17.0)
Hemoglobin: 13.9 g/dL (ref 13.0–17.0)
Hemoglobin: 10.5 g/dL — ABNORMAL LOW (ref 13.0–17.0)
Hemoglobin: 13.6 g/dL (ref 13.0–17.0)
Hemoglobin: 9.9 g/dL — ABNORMAL LOW (ref 13.0–17.0)
Hemoglobin: 10.9 g/dL — ABNORMAL LOW (ref 13.0–17.0)
Hemoglobin: 10.2 g/dL — ABNORMAL LOW (ref 13.0–17.0)
Hemoglobin: 10.5 g/dL — ABNORMAL LOW (ref 13.0–17.0)
POTASSIUM: 4.4 mmol/L (ref 3.5–5.1)
Potassium: 4.6 mmol/L (ref 3.5–5.1)
Potassium: 4.7 mmol/L (ref 3.5–5.1)
Potassium: 4.7 mmol/L (ref 3.5–5.1)
Potassium: 4.9 mmol/L (ref 3.5–5.1)
Potassium: 4.8 mmol/L (ref 3.5–5.1)
Potassium: 4.1 mmol/L (ref 3.5–5.1)
Potassium: 3.8 mmol/L (ref 3.5–5.1)
SODIUM: 140 mmol/L (ref 135–145)
SODIUM: 141 mmol/L (ref 135–145)
SODIUM: 139 mmol/L (ref 135–145)
Sodium: 140 mmol/L (ref 135–145)
Sodium: 140 mmol/L (ref 135–145)
Sodium: 139 mmol/L (ref 135–145)
Sodium: 139 mmol/L (ref 135–145)
Sodium: 142 mmol/L (ref 135–145)
TCO2: 25 mmol/L (ref 22–32)
TCO2: 25 mmol/L (ref 22–32)
TCO2: 25 mmol/L (ref 22–32)
TCO2: 28 mmol/L (ref 22–32)
TCO2: 28 mmol/L (ref 22–32)
TCO2: 26 mmol/L (ref 22–32)
TCO2: 25 mmol/L (ref 22–32)
TCO2: 23 mmol/L (ref 22–32)

## 2017-08-17 LAB — GLUCOSE, CAPILLARY
GLUCOSE-CAPILLARY: 105 mg/dL — AB (ref 65–99)
GLUCOSE-CAPILLARY: 182 mg/dL — AB (ref 65–99)
Glucose-Capillary: 122 mg/dL — ABNORMAL HIGH (ref 65–99)
Glucose-Capillary: 121 mg/dL — ABNORMAL HIGH (ref 65–99)
Glucose-Capillary: 119 mg/dL — ABNORMAL HIGH (ref 65–99)
Glucose-Capillary: 130 mg/dL — ABNORMAL HIGH (ref 65–99)
Glucose-Capillary: 148 mg/dL — ABNORMAL HIGH (ref 65–99)

## 2017-08-17 LAB — CBC
HCT: 37.1 % — ABNORMAL LOW (ref 39.0–52.0)
HCT: 32.2 % — ABNORMAL LOW (ref 39.0–52.0)
HEMATOCRIT: 45.4 % (ref 39.0–52.0)
Hemoglobin: 14.6 g/dL (ref 13.0–17.0)
Hemoglobin: 12.2 g/dL — ABNORMAL LOW (ref 13.0–17.0)
Hemoglobin: 10.7 g/dL — ABNORMAL LOW (ref 13.0–17.0)
MCH: 32.2 pg (ref 26.0–34.0)
MCH: 32.8 pg (ref 26.0–34.0)
MCH: 33 pg (ref 26.0–34.0)
MCHC: 32.2 g/dL (ref 30.0–36.0)
MCHC: 32.9 g/dL (ref 30.0–36.0)
MCHC: 33.2 g/dL (ref 30.0–36.0)
MCV: 100 fL (ref 78.0–100.0)
MCV: 99.7 fL (ref 78.0–100.0)
MCV: 99.4 fL (ref 78.0–100.0)
PLATELETS: 113 10*3/uL — AB (ref 150–400)
PLATELETS: 103 10*3/uL — AB (ref 150–400)
Platelets: 105 10*3/uL — ABNORMAL LOW (ref 150–400)
RBC: 4.54 MIL/uL (ref 4.22–5.81)
RBC: 3.72 MIL/uL — ABNORMAL LOW (ref 4.22–5.81)
RBC: 3.24 MIL/uL — ABNORMAL LOW (ref 4.22–5.81)
RDW: 13.9 % (ref 11.5–15.5)
RDW: 13.8 % (ref 11.5–15.5)
RDW: 13.5 % (ref 11.5–15.5)
WBC: 8.2 10*3/uL (ref 4.0–10.5)
WBC: 14 10*3/uL — AB (ref 4.0–10.5)
WBC: 11.5 10*3/uL — ABNORMAL HIGH (ref 4.0–10.5)

## 2017-08-17 LAB — TROPONIN I: TROPONIN I: 0.24 ng/mL — AB (ref <0.03)

## 2017-08-17 LAB — POCT I-STAT 4, (NA,K, GLUC, HGB,HCT)
GLUCOSE: 130 mg/dL — AB (ref 65–99)
HEMATOCRIT: 34 % — AB (ref 39.0–52.0)
Hemoglobin: 11.6 g/dL — ABNORMAL LOW (ref 13.0–17.0)
Potassium: 3.8 mmol/L (ref 3.5–5.1)
Sodium: 144 mmol/L (ref 135–145)

## 2017-08-17 LAB — HEMOGLOBIN AND HEMATOCRIT, BLOOD
HCT: 31.6 % — ABNORMAL LOW (ref 39.0–52.0)
Hemoglobin: 10.3 g/dL — ABNORMAL LOW (ref 13.0–17.0)

## 2017-08-17 LAB — HEPARIN LEVEL (UNFRACTIONATED): HEPARIN UNFRACTIONATED: 0.27 [IU]/mL — AB (ref 0.30–0.70)

## 2017-08-17 LAB — CREATININE, SERUM
Creatinine, Ser: 0.86 mg/dL (ref 0.61–1.24)
GFR calc Af Amer: >60 mL/min (ref >60)
GFR calc non Af Amer: >60 mL/min (ref >60)

## 2017-08-17 LAB — APTT: aPTT: 33 seconds (ref 24–36)

## 2017-08-17 LAB — POCT ACTIVATED CLOTTING TIME: ACTIVATED CLOTTING TIME: 131 s

## 2017-08-17 LAB — MAGNESIUM: Magnesium: 2.9 mg/dL — ABNORMAL HIGH (ref 1.7–2.4)

## 2017-08-17 LAB — PLATELET COUNT: Platelets: 67 10*3/uL — ABNORMAL LOW (ref 150–400)

## 2017-08-17 SURGERY — CORONARY ARTERY BYPASS GRAFTING (CABG)
Anesthesia: General | Site: Chest

## 2017-08-17 MED ORDER — PHENYLEPHRINE HCL 10 MG/ML IJ SOLN
INTRAVENOUS | Status: DC | PRN
  Administered 2017-08-17: 20 ug/min via INTRAVENOUS

## 2017-08-17 MED ORDER — OXYCODONE HCL 5 MG PO TABS
5.0000 mg | ORAL_TABLET | ORAL | Status: DC | PRN
  Administered 2017-08-18 (×2): 5 mg via ORAL
  Administered 2017-08-21: 10 mg via ORAL
  Filled 2017-08-17 (×2): qty 1
  Filled 2017-08-17: qty 2

## 2017-08-17 MED ORDER — PROPOFOL 10 MG/ML IV BOLUS
INTRAVENOUS | Status: AC
  Filled 2017-08-17: qty 20

## 2017-08-17 MED ORDER — SODIUM CHLORIDE 0.9 % IV SOLN
1.5000 g | Freq: Two times a day (BID) | INTRAVENOUS | Status: AC
  Administered 2017-08-17 – 2017-08-19 (×4): 1.5 g via INTRAVENOUS
  Filled 2017-08-17 (×5): qty 1.5

## 2017-08-17 MED ORDER — DOPAMINE-DEXTROSE 3.2-5 MG/ML-% IV SOLN
7.0000 ug/kg/min | INTRAVENOUS | Status: DC
  Administered 2017-08-17: 5 ug/kg/min via INTRAVENOUS
  Administered 2017-08-18: 7 ug/kg/min via INTRAVENOUS
  Filled 2017-08-17: qty 250

## 2017-08-17 MED ORDER — ARTIFICIAL TEARS OPHTHALMIC OINT
TOPICAL_OINTMENT | OPHTHALMIC | Status: DC | PRN
  Administered 2017-08-17: 1 via OPHTHALMIC

## 2017-08-17 MED ORDER — POTASSIUM CHLORIDE 10 MEQ/50ML IV SOLN
10.0000 meq | INTRAVENOUS | Status: AC
  Administered 2017-08-17 (×3): 10 meq via INTRAVENOUS

## 2017-08-17 MED ORDER — ROCURONIUM BROMIDE 10 MG/ML (PF) SYRINGE
PREFILLED_SYRINGE | INTRAVENOUS | Status: AC
  Filled 2017-08-17: qty 5

## 2017-08-17 MED ORDER — PROTAMINE SULFATE 10 MG/ML IV SOLN
INTRAVENOUS | Status: AC
  Filled 2017-08-17: qty 25

## 2017-08-17 MED ORDER — INSULIN REGULAR BOLUS VIA INFUSION
0.0000 [IU] | Freq: Three times a day (TID) | INTRAVENOUS | Status: DC
  Filled 2017-08-17: qty 10

## 2017-08-17 MED ORDER — FENTANYL CITRATE (PF) 250 MCG/5ML IJ SOLN
INTRAMUSCULAR | Status: AC
  Filled 2017-08-17: qty 25

## 2017-08-17 MED ORDER — VANCOMYCIN HCL IN DEXTROSE 1-5 GM/200ML-% IV SOLN
1000.0000 mg | Freq: Once | INTRAVENOUS | Status: DC
  Filled 2017-08-17: qty 200

## 2017-08-17 MED ORDER — ASPIRIN EC 325 MG PO TBEC
325.0000 mg | DELAYED_RELEASE_TABLET | Freq: Every day | ORAL | Status: DC
  Administered 2017-08-18: 325 mg via ORAL
  Filled 2017-08-17: qty 1

## 2017-08-17 MED ORDER — PLASMA-LYTE 148 IV SOLN
INTRAVENOUS | Status: DC
  Filled 2017-08-17: qty 2.5

## 2017-08-17 MED ORDER — ACETAMINOPHEN 500 MG PO TABS
1000.0000 mg | ORAL_TABLET | Freq: Four times a day (QID) | ORAL | Status: DC
  Administered 2017-08-18 – 2017-08-21 (×14): 1000 mg via ORAL
  Filled 2017-08-17 (×14): qty 2

## 2017-08-17 MED ORDER — METOPROLOL TARTRATE 5 MG/5ML IV SOLN: 2.5000 mg | INTRAVENOUS | Status: DC | PRN

## 2017-08-17 MED ORDER — HEMOSTATIC AGENTS (NO CHARGE) OPTIME
TOPICAL | Status: DC | PRN
  Administered 2017-08-17 (×4): 1 via TOPICAL

## 2017-08-17 MED ORDER — FENTANYL CITRATE (PF) 250 MCG/5ML IJ SOLN
INTRAMUSCULAR | Status: DC | PRN
  Administered 2017-08-17 (×2): 100 ug via INTRAVENOUS
  Administered 2017-08-17: 200 ug via INTRAVENOUS
  Administered 2017-08-17 (×4): 150 ug via INTRAVENOUS
  Administered 2017-08-17: 50 ug via INTRAVENOUS
  Administered 2017-08-17 (×2): 100 ug via INTRAVENOUS

## 2017-08-17 MED ORDER — SODIUM CHLORIDE 0.9 % IV SOLN: Freq: Once | INTRAVENOUS | Status: DC

## 2017-08-17 MED ORDER — SODIUM CHLORIDE 0.9 % IV SOLN
INTRAVENOUS | Status: DC
  Filled 2017-08-17 (×2): qty 1

## 2017-08-17 MED ORDER — LACTATED RINGERS IV SOLN
INTRAVENOUS | Status: DC | PRN
  Administered 2017-08-17 (×2): via INTRAVENOUS

## 2017-08-17 MED ORDER — METOCLOPRAMIDE HCL 5 MG/ML IJ SOLN
10.0000 mg | Freq: Four times a day (QID) | INTRAMUSCULAR | Status: DC
  Administered 2017-08-17 – 2017-08-20 (×11): 10 mg via INTRAVENOUS
  Filled 2017-08-17 (×11): qty 2

## 2017-08-17 MED ORDER — LIDOCAINE 2% (20 MG/ML) 5 ML SYRINGE
INTRAMUSCULAR | Status: AC
  Filled 2017-08-17: qty 5

## 2017-08-17 MED ORDER — METOPROLOL TARTRATE 25 MG/10 ML ORAL SUSPENSION
12.5000 mg | Freq: Two times a day (BID) | ORAL | Status: DC
  Filled 2017-08-17: qty 5

## 2017-08-17 MED ORDER — TRANEXAMIC ACID 1000 MG/10ML IV SOLN
1.5000 mg/kg/h | INTRAVENOUS | Status: AC
  Administered 2017-08-17: 1.5 mg/kg/h via INTRAVENOUS
  Filled 2017-08-17: qty 10

## 2017-08-17 MED ORDER — ONDANSETRON HCL 4 MG/2ML IJ SOLN
4.0000 mg | Freq: Four times a day (QID) | INTRAMUSCULAR | Status: DC | PRN
  Administered 2017-08-18: 4 mg via INTRAVENOUS
  Filled 2017-08-17: qty 2

## 2017-08-17 MED ORDER — HEPARIN SODIUM (PORCINE) 1000 UNIT/ML IJ SOLN
INTRAMUSCULAR | Status: AC
  Filled 2017-08-17: qty 1

## 2017-08-17 MED ORDER — LACTATED RINGERS IV SOLN
INTRAVENOUS | Status: DC
  Administered 2017-08-17: 20 mL/h via INTRAVENOUS
  Administered 2017-08-18: 20 mL via INTRAVENOUS

## 2017-08-17 MED ORDER — LEVALBUTEROL HCL 1.25 MG/0.5ML IN NEBU
1.2500 mg | INHALATION_SOLUTION | Freq: Four times a day (QID) | RESPIRATORY_TRACT | Status: DC
  Administered 2017-08-17 – 2017-08-18 (×2): 1.25 mg via RESPIRATORY_TRACT
  Filled 2017-08-17 (×2): qty 0.5

## 2017-08-17 MED ORDER — METOPROLOL TARTRATE 12.5 MG HALF TABLET
12.5000 mg | ORAL_TABLET | Freq: Two times a day (BID) | ORAL | Status: DC
  Administered 2017-08-19 – 2017-08-21 (×3): 12.5 mg via ORAL
  Filled 2017-08-17 (×4): qty 1

## 2017-08-17 MED ORDER — MIDAZOLAM HCL 5 MG/5ML IJ SOLN
INTRAMUSCULAR | Status: DC | PRN
  Administered 2017-08-17: 3 mg via INTRAVENOUS
  Administered 2017-08-17: 1 mg via INTRAVENOUS
  Administered 2017-08-17: 2 mg via INTRAVENOUS
  Administered 2017-08-17: 1 mg via INTRAVENOUS
  Administered 2017-08-17: 2 mg via INTRAVENOUS
  Administered 2017-08-17: 1 mg via INTRAVENOUS

## 2017-08-17 MED ORDER — ACETAMINOPHEN 160 MG/5ML PO SOLN
1000.0000 mg | Freq: Four times a day (QID) | ORAL | Status: DC
  Administered 2017-08-17: 1000 mg
  Filled 2017-08-17: qty 40.6

## 2017-08-17 MED ORDER — LACTATED RINGERS IV SOLN: 500.0000 mL | Freq: Once | INTRAVENOUS | Status: DC | PRN

## 2017-08-17 MED ORDER — ASPIRIN 81 MG PO CHEW: 324.0000 mg | CHEWABLE_TABLET | Freq: Every day | ORAL | Status: DC

## 2017-08-17 MED ORDER — CHLORHEXIDINE GLUCONATE 0.12 % MT SOLN
15.0000 mL | OROMUCOSAL | Status: AC
  Administered 2017-08-17: 15 mL via OROMUCOSAL

## 2017-08-17 MED ORDER — PLASMA-LYTE 148 IV SOLN
INTRAVENOUS | Status: DC | PRN
  Administered 2017-08-17 (×2): via INTRAVASCULAR

## 2017-08-17 MED ORDER — SODIUM CHLORIDE 0.9 % IV SOLN
INTRAVENOUS | Status: DC | PRN
  Administered 2017-08-17: 15:00:00 via INTRAVENOUS

## 2017-08-17 MED ORDER — LEVOTHYROXINE SODIUM 175 MCG PO TABS
175.0000 ug | ORAL_TABLET | Freq: Every day | ORAL | Status: DC
Start: 2017-08-18 — End: 2017-08-20
  Administered 2017-08-18 – 2017-08-20 (×3): 175 ug via ORAL
  Filled 2017-08-17 (×3): qty 1

## 2017-08-17 MED ORDER — DEXMEDETOMIDINE HCL IN NACL 200 MCG/50ML IV SOLN
0.0000 ug/kg/h | INTRAVENOUS | Status: DC
  Administered 2017-08-17: 0.3 ug/kg/h via INTRAVENOUS
  Administered 2017-08-17: 0.7 ug/kg/h via INTRAVENOUS
  Filled 2017-08-17: qty 50

## 2017-08-17 MED ORDER — PROTAMINE SULFATE 10 MG/ML IV SOLN
INTRAVENOUS | Status: AC
  Filled 2017-08-17: qty 5

## 2017-08-17 MED ORDER — SODIUM CHLORIDE 0.9 % IV SOLN: 250.0000 mL | INTRAVENOUS | Status: DC

## 2017-08-17 MED ORDER — MIDAZOLAM HCL 2 MG/2ML IJ SOLN: 2.0000 mg | INTRAMUSCULAR | Status: DC | PRN

## 2017-08-17 MED ORDER — NOREPINEPHRINE 4 MG/250ML-% IV SOLN
0.0000 ug/min | INTRAVENOUS | Status: DC
  Administered 2017-08-17: 3 ug/min via INTRAVENOUS
  Administered 2017-08-18: 10 ug/min via INTRAVENOUS
  Administered 2017-08-18: 6 ug/min via INTRAVENOUS
  Administered 2017-08-18: 10 ug/min via INTRAVENOUS
  Filled 2017-08-17 (×4): qty 250

## 2017-08-17 MED ORDER — SUCCINYLCHOLINE CHLORIDE 200 MG/10ML IV SOSY
PREFILLED_SYRINGE | INTRAVENOUS | Status: AC
  Filled 2017-08-17: qty 10

## 2017-08-17 MED ORDER — PHENYLEPHRINE 40 MCG/ML (10ML) SYRINGE FOR IV PUSH (FOR BLOOD PRESSURE SUPPORT)
PREFILLED_SYRINGE | INTRAVENOUS | Status: AC
  Filled 2017-08-17: qty 10

## 2017-08-17 MED ORDER — SODIUM CHLORIDE 0.9 % IV SOLN
INTRAVENOUS | Status: DC
  Administered 2017-08-17: 20 mL/h via INTRAVENOUS

## 2017-08-17 MED ORDER — PROPOFOL 10 MG/ML IV BOLUS
INTRAVENOUS | Status: DC | PRN
  Administered 2017-08-17: 60 mg via INTRAVENOUS

## 2017-08-17 MED ORDER — SODIUM CHLORIDE 0.9 % IV SOLN
0.0000 ug/min | INTRAVENOUS | Status: DC
  Filled 2017-08-17: qty 2

## 2017-08-17 MED ORDER — LIDOCAINE 2% (20 MG/ML) 5 ML SYRINGE
INTRAMUSCULAR | Status: DC | PRN
  Administered 2017-08-17: 40 mg via INTRAVENOUS

## 2017-08-17 MED ORDER — ROCURONIUM BROMIDE 10 MG/ML (PF) SYRINGE
PREFILLED_SYRINGE | INTRAVENOUS | Status: DC | PRN
  Administered 2017-08-17 (×6): 50 mg via INTRAVENOUS

## 2017-08-17 MED ORDER — HEPARIN SODIUM (PORCINE) 1000 UNIT/ML IJ SOLN
INTRAMUSCULAR | Status: DC | PRN
  Administered 2017-08-17: 2000 [IU] via INTRAVENOUS
  Administered 2017-08-17: 36000 [IU] via INTRAVENOUS

## 2017-08-17 MED ORDER — SODIUM CHLORIDE 0.9 % IV SOLN
INTRAVENOUS | Status: DC | PRN
  Administered 2017-08-17: 750 mg via INTRAVENOUS

## 2017-08-17 MED ORDER — ACETAMINOPHEN 650 MG RE SUPP
650.0000 mg | Freq: Once | RECTAL | Status: AC
  Administered 2017-08-17: 650 mg via RECTAL

## 2017-08-17 MED ORDER — LEVALBUTEROL HCL 0.63 MG/3ML IN NEBU
0.6300 mg | INHALATION_SOLUTION | Freq: Three times a day (TID) | RESPIRATORY_TRACT | Status: DC
  Administered 2017-08-17: 0.63 mg via RESPIRATORY_TRACT
  Filled 2017-08-17: qty 3

## 2017-08-17 MED ORDER — SODIUM CHLORIDE 0.9% FLUSH: 3.0000 mL | INTRAVENOUS | Status: DC | PRN

## 2017-08-17 MED ORDER — ARTIFICIAL TEARS OPHTHALMIC OINT
TOPICAL_OINTMENT | OPHTHALMIC | Status: AC
  Filled 2017-08-17: qty 3.5

## 2017-08-17 MED ORDER — ATORVASTATIN CALCIUM 80 MG PO TABS
80.0000 mg | ORAL_TABLET | Freq: Every day | ORAL | Status: DC
  Administered 2017-08-18 – 2017-08-23 (×6): 80 mg via ORAL
  Filled 2017-08-17 (×6): qty 1

## 2017-08-17 MED ORDER — VANCOMYCIN HCL IN DEXTROSE 1-5 GM/200ML-% IV SOLN
1000.0000 mg | Freq: Two times a day (BID) | INTRAVENOUS | Status: AC
  Administered 2017-08-17 – 2017-08-18 (×2): 1000 mg via INTRAVENOUS
  Filled 2017-08-17 (×2): qty 200

## 2017-08-17 MED ORDER — FAMOTIDINE IN NACL 20-0.9 MG/50ML-% IV SOLN
20.0000 mg | Freq: Two times a day (BID) | INTRAVENOUS | Status: AC
  Administered 2017-08-17 – 2017-08-18 (×2): 20 mg via INTRAVENOUS
  Filled 2017-08-17: qty 50

## 2017-08-17 MED ORDER — DEXMEDETOMIDINE HCL IN NACL 200 MCG/50ML IV SOLN
INTRAVENOUS | Status: AC
  Filled 2017-08-17: qty 50

## 2017-08-17 MED ORDER — DOCUSATE SODIUM 100 MG PO CAPS
200.0000 mg | ORAL_CAPSULE | Freq: Every day | ORAL | Status: DC
  Administered 2017-08-18 – 2017-08-20 (×3): 200 mg via ORAL
  Filled 2017-08-17 (×3): qty 2

## 2017-08-17 MED ORDER — MAGNESIUM SULFATE 4 GM/100ML IV SOLN
4.0000 g | Freq: Once | INTRAVENOUS | Status: AC
  Administered 2017-08-17: 4 g via INTRAVENOUS
  Filled 2017-08-17: qty 100

## 2017-08-17 MED ORDER — MIDAZOLAM HCL 10 MG/2ML IJ SOLN
INTRAMUSCULAR | Status: AC
  Filled 2017-08-17: qty 2

## 2017-08-17 MED ORDER — MOMETASONE FURO-FORMOTEROL FUM 200-5 MCG/ACT IN AERO
2.0000 | INHALATION_SPRAY | Freq: Two times a day (BID) | RESPIRATORY_TRACT | Status: DC
  Administered 2017-08-18 – 2017-08-24 (×12): 2 via RESPIRATORY_TRACT
  Filled 2017-08-17 (×2): qty 8.8

## 2017-08-17 MED ORDER — LACTATED RINGERS IV SOLN: INTRAVENOUS | Status: DC

## 2017-08-17 MED ORDER — PROTAMINE SULFATE 10 MG/ML IV SOLN
INTRAVENOUS | Status: AC
  Filled 2017-08-17: qty 10

## 2017-08-17 MED ORDER — SODIUM CHLORIDE 0.45 % IV SOLN
INTRAVENOUS | Status: DC | PRN
  Administered 2017-08-17: 20 mL/h via INTRAVENOUS

## 2017-08-17 MED ORDER — MORPHINE SULFATE (PF) 2 MG/ML IV SOLN
1.0000 mg | INTRAVENOUS | Status: DC | PRN
  Administered 2017-08-17: 2 mg via INTRAVENOUS

## 2017-08-17 MED ORDER — BISACODYL 5 MG PO TBEC
10.0000 mg | DELAYED_RELEASE_TABLET | Freq: Every day | ORAL | Status: DC
  Administered 2017-08-18 – 2017-08-20 (×3): 10 mg via ORAL
  Filled 2017-08-17 (×3): qty 2

## 2017-08-17 MED ORDER — MORPHINE SULFATE (PF) 2 MG/ML IV SOLN
2.0000 mg | INTRAVENOUS | Status: DC | PRN
  Administered 2017-08-18 (×3): 2 mg via INTRAVENOUS
  Filled 2017-08-17 (×2): qty 2

## 2017-08-17 MED ORDER — BISACODYL 10 MG RE SUPP: 10.0000 mg | Freq: Every day | RECTAL | Status: DC

## 2017-08-17 MED ORDER — SODIUM CHLORIDE 0.9 % IV SOLN
INTRAVENOUS | Status: DC | PRN
  Administered 2017-08-17: 1.5 mg/kg/h via INTRAVENOUS

## 2017-08-17 MED ORDER — HEPARIN (PORCINE) IN NACL 100-0.45 UNIT/ML-% IJ SOLN: 1400.0000 [IU]/h | INTRAMUSCULAR | Status: DC

## 2017-08-17 MED ORDER — LACTATED RINGERS IV SOLN
INTRAVENOUS | Status: DC | PRN
  Administered 2017-08-17: 08:00:00 via INTRAVENOUS

## 2017-08-17 MED ORDER — SODIUM CHLORIDE 0.9% FLUSH
3.0000 mL | Freq: Two times a day (BID) | INTRAVENOUS | Status: DC
  Administered 2017-08-18 – 2017-08-19 (×3): 3 mL via INTRAVENOUS

## 2017-08-17 MED ORDER — TRAMADOL HCL 50 MG PO TABS
50.0000 mg | ORAL_TABLET | ORAL | Status: DC | PRN
  Administered 2017-08-19: 100 mg via ORAL
  Filled 2017-08-17: qty 2

## 2017-08-17 MED ORDER — MILRINONE LACTATE IN DEXTROSE 20-5 MG/100ML-% IV SOLN
0.1250 ug/kg/min | INTRAVENOUS | Status: DC
  Administered 2017-08-17 – 2017-08-18 (×3): 0.25 ug/kg/min via INTRAVENOUS
  Administered 2017-08-19: 0.125 ug/kg/min via INTRAVENOUS
  Administered 2017-08-19: 0.25 ug/kg/min via INTRAVENOUS
  Filled 2017-08-17 (×4): qty 100

## 2017-08-17 MED ORDER — 0.9 % SODIUM CHLORIDE (POUR BTL) OPTIME
TOPICAL | Status: DC | PRN
  Administered 2017-08-17: 5000 mL

## 2017-08-17 MED ORDER — PROTAMINE SULFATE 10 MG/ML IV SOLN
INTRAVENOUS | Status: DC | PRN
  Administered 2017-08-17: 30 mg via INTRAVENOUS
  Administered 2017-08-17: 350 mg via INTRAVENOUS

## 2017-08-17 MED ORDER — AMIODARONE HCL IN DEXTROSE 360-4.14 MG/200ML-% IV SOLN
INTRAVENOUS | Status: AC
  Filled 2017-08-17: qty 200

## 2017-08-17 MED ORDER — NITROGLYCERIN IN D5W 200-5 MCG/ML-% IV SOLN: 0.0000 ug/min | INTRAVENOUS | Status: DC

## 2017-08-17 MED ORDER — PANTOPRAZOLE SODIUM 40 MG PO TBEC
40.0000 mg | DELAYED_RELEASE_TABLET | Freq: Every day | ORAL | Status: DC
  Administered 2017-08-19 – 2017-08-21 (×3): 40 mg via ORAL
  Filled 2017-08-17 (×3): qty 1

## 2017-08-17 MED ORDER — DEXTROSE 5 % IV SOLN
0.0000 ug/min | INTRAVENOUS | Status: AC
  Administered 2017-08-17: 3 ug/min via INTRAVENOUS
  Filled 2017-08-17: qty 4

## 2017-08-17 MED ORDER — ALBUMIN HUMAN 5 % IV SOLN
250.0000 mL | INTRAVENOUS | Status: AC | PRN
  Administered 2017-08-17 – 2017-08-18 (×3): 250 mL via INTRAVENOUS
  Filled 2017-08-17: qty 250

## 2017-08-17 MED ORDER — ACETAMINOPHEN 160 MG/5ML PO SOLN: 650.0000 mg | Freq: Once | ORAL | Status: AC

## 2017-08-17 MED FILL — Heparin Sod (Porcine)-NaCl IV Soln 1000 Unit/500ML-0.9%: INTRAVENOUS | Qty: 1000 | Status: AC

## 2017-08-17 SURGICAL SUPPLY — 106 items
ADAPTER CARDIO PERF ANTE/RETRO (ADAPTER) ×4 IMPLANT
ADH SRG 12 PREFL SYR 3 SPRDR (MISCELLANEOUS)
ADPR PRFSN 84XANTGRD RTRGD (ADAPTER) ×2
BAG DECANTER FOR FLEXI CONT (MISCELLANEOUS) ×4 IMPLANT
BANDAGE ACE 4X5 VEL STRL LF (GAUZE/BANDAGES/DRESSINGS) ×4 IMPLANT
BANDAGE ACE 6X5 VEL STRL LF (GAUZE/BANDAGES/DRESSINGS) ×4 IMPLANT
BASKET HEART  (ORDER IN 25'S) (MISCELLANEOUS) ×1
BASKET HEART (ORDER IN 25'S) (MISCELLANEOUS) ×1
BASKET HEART (ORDER IN 25S) (MISCELLANEOUS) ×2 IMPLANT
BLADE CLIPPER SURG (BLADE) IMPLANT
BLADE STERNUM SYSTEM 6 (BLADE) ×4 IMPLANT
BLADE SURG 12 STRL SS (BLADE) ×4 IMPLANT
BLADE SURG 15 STRL LF DISP TIS (BLADE) ×2 IMPLANT
BLADE SURG 15 STRL SS (BLADE) ×4
BNDG GAUZE ELAST 4 BULKY (GAUZE/BANDAGES/DRESSINGS) ×4 IMPLANT
CANISTER SUCT 3000ML PPV (MISCELLANEOUS) ×4 IMPLANT
CANNULA ARTERIAL NVNT 3/8 22FR (MISCELLANEOUS) ×2 IMPLANT
CANNULA GUNDRY RCSP 15FR (MISCELLANEOUS) ×4 IMPLANT
CATH CPB KIT VANTRIGT (MISCELLANEOUS) ×4 IMPLANT
CATH HEART VENT LEFT (CATHETERS) ×2 IMPLANT
CATH RETROPLEGIA CORONARY 14FR (CATHETERS) IMPLANT
CATH ROBINSON RED A/P 18FR (CATHETERS) ×12 IMPLANT
CATH THORACIC 36FR RT ANG (CATHETERS) ×4 IMPLANT
CLIP FOGARTY SPRING 6M (CLIP) ×2 IMPLANT
CONT SPEC 4OZ CLIKSEAL STRL BL (MISCELLANEOUS) ×2 IMPLANT
COVER SURGICAL LIGHT HANDLE (MISCELLANEOUS) ×8 IMPLANT
CRADLE DONUT ADULT HEAD (MISCELLANEOUS) ×4 IMPLANT
DRAIN CHANNEL 32F RND 10.7 FF (WOUND CARE) ×4 IMPLANT
DRAPE CARDIOVASCULAR INCISE (DRAPES) ×4
DRAPE SLUSH/WARMER DISC (DRAPES) ×4 IMPLANT
DRAPE SRG 135X102X78XABS (DRAPES) ×2 IMPLANT
DRSG AQUACEL AG ADV 3.5X14 (GAUZE/BANDAGES/DRESSINGS) ×4 IMPLANT
ELECT BLADE 4.0 EZ CLEAN MEGAD (MISCELLANEOUS) ×4
ELECT CAUTERY BLADE 6.4 (BLADE) ×6 IMPLANT
ELECT REM PT RETURN 9FT ADLT (ELECTROSURGICAL) ×8
ELECTRODE BLDE 4.0 EZ CLN MEGD (MISCELLANEOUS) ×2 IMPLANT
ELECTRODE REM PT RTRN 9FT ADLT (ELECTROSURGICAL) ×4 IMPLANT
FELT TEFLON 1X6 (MISCELLANEOUS) ×10 IMPLANT
GAUZE SPONGE 4X4 12PLY STRL (GAUZE/BANDAGES/DRESSINGS) ×8 IMPLANT
GAUZE SPONGE 4X4 12PLY STRL LF (GAUZE/BANDAGES/DRESSINGS) ×4 IMPLANT
GLOVE BIO SURGEON STRL SZ7.5 (GLOVE) ×12 IMPLANT
GOWN STRL REUS W/ TWL LRG LVL3 (GOWN DISPOSABLE) ×8 IMPLANT
GOWN STRL REUS W/TWL LRG LVL3 (GOWN DISPOSABLE) ×16
HEMOSTAT POWDER SURGIFOAM 1G (HEMOSTASIS) ×12 IMPLANT
HEMOSTAT SURGICEL 2X14 (HEMOSTASIS) ×4 IMPLANT
INSERT FOGARTY XLG (MISCELLANEOUS) IMPLANT
KIT BASIN OR (CUSTOM PROCEDURE TRAY) ×4 IMPLANT
KIT SUCTION CATH 14FR (SUCTIONS) ×4 IMPLANT
KIT TURNOVER KIT B (KITS) ×4 IMPLANT
KIT VASOVIEW HEMOPRO VH 3000 (KITS) ×4 IMPLANT
LEAD PACING MYOCARDI (MISCELLANEOUS) ×4 IMPLANT
LINE VENT (MISCELLANEOUS) ×2 IMPLANT
MARKER GRAFT CORONARY BYPASS (MISCELLANEOUS) ×12 IMPLANT
NS IRRIG 1000ML POUR BTL (IV SOLUTION) ×24 IMPLANT
PACK E OPEN HEART (SUTURE) ×4 IMPLANT
PACK OPEN HEART (CUSTOM PROCEDURE TRAY) ×4 IMPLANT
PAD ARMBOARD 7.5X6 YLW CONV (MISCELLANEOUS) ×8 IMPLANT
PAD ELECT DEFIB RADIOL ZOLL (MISCELLANEOUS) ×4 IMPLANT
PENCIL BUTTON HOLSTER BLD 10FT (ELECTRODE) ×4 IMPLANT
PUNCH AORTIC ROTATE 4.0MM (MISCELLANEOUS) IMPLANT
PUNCH AORTIC ROTATE 4.5MM 8IN (MISCELLANEOUS) IMPLANT
PUNCH AORTIC ROTATE 5MM 8IN (MISCELLANEOUS) IMPLANT
SET CARDIOPLEGIA MPS 5001102 (MISCELLANEOUS) ×2 IMPLANT
SPONGE LAP 18X18 X RAY DECT (DISPOSABLE) ×4 IMPLANT
SPONGE LAP 4X18 RFD (DISPOSABLE) ×2 IMPLANT
SUT BONE WAX W31G (SUTURE) ×4 IMPLANT
SUT ETHIBON 2 0 V 52N 30 (SUTURE) ×8 IMPLANT
SUT ETHIBOND 2 0 SH (SUTURE) ×4
SUT ETHIBOND 2 0 SH 36X2 (SUTURE) ×2 IMPLANT
SUT MNCRL AB 4-0 PS2 18 (SUTURE) IMPLANT
SUT PROLENE 3 0 RB 1 (SUTURE) ×4 IMPLANT
SUT PROLENE 3 0 SH 1 (SUTURE) IMPLANT
SUT PROLENE 3 0 SH DA (SUTURE) IMPLANT
SUT PROLENE 3 0 SH1 36 (SUTURE) IMPLANT
SUT PROLENE 4 0 RB 1 (SUTURE) ×20
SUT PROLENE 4 0 SH DA (SUTURE) ×8 IMPLANT
SUT PROLENE 4-0 RB1 .5 CRCL 36 (SUTURE) ×6 IMPLANT
SUT PROLENE 5 0 C 1 36 (SUTURE) IMPLANT
SUT PROLENE 6 0 C 1 30 (SUTURE) IMPLANT
SUT PROLENE 6 0 CC (SUTURE) ×16 IMPLANT
SUT PROLENE 8 0 BV175 6 (SUTURE) ×4 IMPLANT
SUT PROLENE BLUE 7 0 (SUTURE) ×4 IMPLANT
SUT SILK  1 MH (SUTURE)
SUT SILK 1 MH (SUTURE) IMPLANT
SUT SILK 2 0 SH CR/8 (SUTURE) ×2 IMPLANT
SUT SILK 3 0 SH CR/8 (SUTURE) IMPLANT
SUT STEEL 6MS V (SUTURE) ×8 IMPLANT
SUT STEEL SZ 6 DBL 3X14 BALL (SUTURE) ×4 IMPLANT
SUT VIC AB 1 CTX 36 (SUTURE) ×12
SUT VIC AB 1 CTX36XBRD ANBCTR (SUTURE) ×4 IMPLANT
SUT VIC AB 2-0 CT1 27 (SUTURE)
SUT VIC AB 2-0 CT1 TAPERPNT 27 (SUTURE) IMPLANT
SUT VIC AB 2-0 CTX 27 (SUTURE) IMPLANT
SUT VIC AB 3-0 X1 27 (SUTURE) IMPLANT
SYR 10ML KIT SKIN ADHESIVE (MISCELLANEOUS) IMPLANT
SYSTEM SAHARA CHEST DRAIN ATS (WOUND CARE) ×4 IMPLANT
TAPE CLOTH SURG 4X10 WHT LF (GAUZE/BANDAGES/DRESSINGS) ×2 IMPLANT
TAPE PAPER 2X10 WHT MICROPORE (GAUZE/BANDAGES/DRESSINGS) ×2 IMPLANT
TOWEL GREEN STERILE (TOWEL DISPOSABLE) ×4 IMPLANT
TOWEL GREEN STERILE FF (TOWEL DISPOSABLE) ×4 IMPLANT
TRAY FOLEY SLVR 16FR TEMP STAT (SET/KITS/TRAYS/PACK) ×4 IMPLANT
TUBING INSUFFLATION (TUBING) ×4 IMPLANT
UNDERPAD 30X30 (UNDERPADS AND DIAPERS) ×4 IMPLANT
VALVE AORTIC SZ23 INSP/RESIL (Prosthesis & Implant Heart) ×2 IMPLANT
VENT LEFT HEART 12002 (CATHETERS) ×4
WATER STERILE IRR 1000ML POUR (IV SOLUTION) ×8 IMPLANT

## 2017-08-17 NOTE — Anesthesia Procedure Notes (Signed)
Central Venous Catheter Insertion Performed by: Kipp Brood, MD, anesthesiologist Start/End5/22/2019 7:10 AM, 08/17/2017 7:20 AM Patient location: Pre-op. Preanesthetic checklist: patient identified, IV checked, site marked, risks and benefits discussed, surgical consent, monitors and equipment checked, pre-op evaluation, timeout performed and anesthesia consent Hand hygiene performed  and maximum sterile barriers used  PA cath was placed.Swan type:thermodilution Procedure performed without using ultrasound guided technique. Attempts: 1 Patient tolerated the procedure well with no immediate complications.

## 2017-08-17 NOTE — Anesthesia Procedure Notes (Addendum)
Central Venous Catheter Insertion Performed by: Kipp Brood, MD, anesthesiologist Start/End5/22/2019 7:10 AM, 08/17/2017 7:20 AM Patient location: OR. Preanesthetic checklist: patient identified, IV checked, site marked, risks and benefits discussed, surgical consent, monitors and equipment checked, pre-op evaluation, timeout performed and anesthesia consent Lidocaine 1% used for infiltration and patient sedated Hand hygiene performed  and maximum sterile barriers used  Catheter size: 8 Fr Total catheter length 16. Central line was placed.Double lumen Procedure performed using ultrasound guided technique. Ultrasound Notes:image(s) printed for medical record Attempts: 1 Following insertion, dressing applied and line sutured. Post procedure assessment: blood return through all ports  Patient tolerated the procedure well with no immediate complications.

## 2017-08-17 NOTE — Progress Notes (Signed)
CT surgery p.m. Rounds  Doing well after AVR CABG Slow to wake up Hemodynamic stable, minimal chest tube output Wean ventilator when appropriate

## 2017-08-17 NOTE — Anesthesia Procedure Notes (Signed)
Procedure Name: Intubation Date/Time: 08/17/2017 8:37 AM Performed by: Nils Pyle, CRNA Pre-anesthesia Checklist: Patient identified, Emergency Drugs available, Suction available and Patient being monitored Patient Re-evaluated:Patient Re-evaluated prior to induction Oxygen Delivery Method: Circle System Utilized Preoxygenation: Pre-oxygenation with 100% oxygen Induction Type: IV induction Ventilation: Two handed mask ventilation required, Oral airway inserted - appropriate to patient size and Mask ventilation without difficulty Laryngoscope Size: Miller and 2 Grade View: Grade I Tube type: Oral Tube size: 8.0 mm Number of attempts: 1 Airway Equipment and Method: Stylet and Oral airway Placement Confirmation: ETT inserted through vocal cords under direct vision,  positive ETCO2 and breath sounds checked- equal and bilateral Secured at: 23 cm Tube secured with: Tape Dental Injury: Teeth and Oropharynx as per pre-operative assessment

## 2017-08-17 NOTE — Transfer of Care (Signed)
Immediate Anesthesia Transfer of Care Note  Patient: Jordan Hebert  Procedure(s) Performed: CORONARY ARTERY BYPASS GRAFTING (CABG) x 4 , USING LEFT INTERNAL MAMMARY ARTERY AND GREATER SAPHENOUS VEIN HARVESTED ENDOSCOPICALLY (N/A Chest) AORTIC VALVE REPLACEMENT (AVR) WITH INSPIRIS AORTIC VALVE (N/A Chest) TRANSESOPHAGEAL ECHOCARDIOGRAM (TEE) (N/A )  Patient Location: SICU  Anesthesia Type:General  Level of Consciousness: sedated, unresponsive and Patient remains intubated per anesthesia plan  Airway & Oxygen Therapy: Patient remains intubated per anesthesia plan and Patient placed on Ventilator (see vital sign flow sheet for setting)  Post-op Assessment: Report given to RN and Post -op Vital signs reviewed and stable  Post vital signs: Reviewed and stable  Last Vitals:  Vitals Value Taken Time  BP    Temp    Pulse    Resp 12 08/17/2017  4:01 PM  SpO2    Vitals shown include unvalidated device data.  Last Pain:  Vitals:   08/17/17 0436  TempSrc: Oral  PainSc:          Complications: No apparent anesthesia complications

## 2017-08-17 NOTE — Progress Notes (Signed)
After clarification from the cardiologist about removing the sheath, this RN turned off heparin infusion, checked an ACT, and prepared to remove sheath. See his note/care order instruction.  A right femoral 37F arterial sheath was removed per sheath removal protocol with myself and Counselling psychologist at bedside.  Manual pressure was held for 20 minutes, vitals cycled q3 minutes, distal pulses and cap refill checked q3 min.  Patient had no bleeding complications and right femoral site is a Level 0.  Patient has no c/o pain.    Will resume heparin gtt 0418 per MD order.

## 2017-08-17 NOTE — Anesthesia Preprocedure Evaluation (Addendum)
Anesthesia Evaluation  Patient identified by MRN, date of birth, ID band Patient awake    Reviewed: Allergy & Precautions, NPO status , Patient's Chart, lab work & pertinent test results  Airway Mallampati: III  TM Distance: >3 FB Neck ROM: Full    Dental  (+) Edentulous Upper, Dental Advisory Given, Poor Dentition, Missing   Pulmonary Current Smoker,    breath sounds clear to auscultation       Cardiovascular + Past MI   Rhythm:Regular Rate:Normal     Neuro/Psych PSYCHIATRIC DISORDERS Depression    GI/Hepatic   Endo/Other  Hyperthyroidism   Renal/GU      Musculoskeletal   Abdominal   Peds  Hematology   Anesthesia Other Findings   Reproductive/Obstetrics                            Anesthesia Physical Anesthesia Plan  ASA: III  Anesthesia Plan: General   Post-op Pain Management:    Induction: Intravenous  PONV Risk Score and Plan: Ondansetron and Dexamethasone  Airway Management Planned: Oral ETT  Additional Equipment: Arterial line, CVP, PA Cath and 3D TEE  Intra-op Plan:   Post-operative Plan: Post-operative intubation/ventilation  Informed Consent: I have reviewed the patients History and Physical, chart, labs and discussed the procedure including the risks, benefits and alternatives for the proposed anesthesia with the patient or authorized representative who has indicated his/her understanding and acceptance.   Dental advisory given  Plan Discussed with: CRNA and Anesthesiologist  Anesthesia Plan Comments:         Anesthesia Quick Evaluation

## 2017-08-17 NOTE — Brief Op Note (Signed)
08/15/2017 - 08/17/2017  3:21 PM  PATIENT:  Jordan Hebert  77 y.o. male  PRE-OPERATIVE DIAGNOSIS:  CAD AS  POST-OPERATIVE DIAGNOSIS:  CAD AS  PROCEDURE:  Procedure(s): CORONARY ARTERY BYPASS GRAFTING (CABG) x 4 , USING LEFT INTERNAL MAMMARY ARTERY AND GREATER SAPHENOUS VEIN HARVESTED ENDOSCOPICALLY (N/A) AORTIC VALVE REPLACEMENT (AVR) WITH INSPIRIS AORTIC VALVE (N/A) TRANSESOPHAGEAL ECHOCARDIOGRAM (TEE) (N/A)  SURGEON:  Surgeon(s) and Role:    Kerin Perna, MD - Primary  PHYSICIAN ASSISTANT:  Jari Favre, PA-C   ANESTHESIA:   general  EBL:  700 mL   BLOOD ADMINISTERED:none  DRAINS: routine   LOCAL MEDICATIONS USED:  NONE  SPECIMEN:  Source of Specimen:  Aortic valve leaflets  DISPOSITION OF SPECIMEN:  PATHOLOGY  COUNTS:  YES  DICTATION: .Dragon Dictation  PLAN OF CARE: Admit to inpatient   PATIENT DISPOSITION:  ICU - intubated and hemodynamically stable.   Delay start of Pharmacological VTE agent (>24hrs) due to surgical blood loss or risk of bleeding: yes

## 2017-08-17 NOTE — Anesthesia Postprocedure Evaluation (Signed)
Anesthesia Post Note  Patient: Jordan Hebert  Procedure(s) Performed: CORONARY ARTERY BYPASS GRAFTING (CABG) x 4 , USING LEFT INTERNAL MAMMARY ARTERY AND GREATER SAPHENOUS VEIN HARVESTED ENDOSCOPICALLY (N/A Chest) AORTIC VALVE REPLACEMENT (AVR) WITH INSPIRIS AORTIC VALVE (N/A Chest) TRANSESOPHAGEAL ECHOCARDIOGRAM (TEE) (N/A )     Patient location during evaluation: SICU Anesthesia Type: General Level of consciousness: sedated and patient remains intubated per anesthesia plan Pain management: pain level controlled Vital Signs Assessment: post-procedure vital signs reviewed and stable Respiratory status: patient remains intubated per anesthesia plan and patient on ventilator - see flowsheet for VS Cardiovascular status: stable and blood pressure returned to baseline Postop Assessment: no apparent nausea or vomiting Anesthetic complications: no    Last Vitals:  Vitals:   08/17/17 2005 08/17/17 2015  BP: (!) 83/57   Pulse:  91  Resp:  17  Temp:  36.8 C  SpO2: 95% 95%    Last Pain:  Vitals:   08/17/17 2000  TempSrc: Core (Comment)  PainSc:                  Chason Mciver COKER

## 2017-08-17 NOTE — Anesthesia Procedure Notes (Signed)
Arterial Line Insertion Start/End5/22/2019 7:15 AM, 08/17/2017 7:22 AM Performed by: Nils Pyle, CRNA, CRNA  Preanesthetic checklist: patient identified, IV checked, site marked, risks and benefits discussed, surgical consent, monitors and equipment checked, pre-op evaluation and anesthesia consent Lidocaine 1% used for infiltration and patient sedated Left, radial was placed Catheter size: 20 G Hand hygiene performed  and maximum sterile barriers used   Attempts: 1 Procedure performed without using ultrasound guided technique. Ultrasound Notes:anatomy identified, needle tip was noted to be adjacent to the nerve/plexus identified and no ultrasound evidence of intravascular and/or intraneural injection Following insertion, dressing applied and Biopatch. Post procedure assessment: normal  Patient tolerated the procedure well with no immediate complications.

## 2017-08-17 NOTE — Progress Notes (Signed)
Recruitment maneuver performed per RN request d/t pt desat slightly 92-93%.  Pt tol well currently, sat  Now100% s/p recruitment.

## 2017-08-17 NOTE — Procedures (Signed)
Extubation Procedure Note  Patient Details:   Name: Jordan Hebert DOB: Oct 22, 1940 MRN: [id number]     Vent end date: 08/17/17 Vent end time: 2310   Evaluation  O2 sats: stable throughout Complications: No apparent complications Patient did tolerate procedure well. Bilateral Breath Sounds: Clear, Diminished   Yes   Patient performed NIF > -20 and VC 1.6L.  Patient had positive cuff leak extubated to 4L [place].    Phill Myron 08/17/2017, 11:12 PM

## 2017-08-17 NOTE — Progress Notes (Signed)
RT Note: Cardiac rapid wean initiated.

## 2017-08-17 NOTE — Progress Notes (Signed)
Pre Procedure note for inpatients:   Jordan Hebert has been scheduled for Procedure(s): CORONARY ARTERY BYPASS GRAFTING (CABG) (N/A) AORTIC VALVE REPLACEMENT (AVR) (N/A) TRANSESOPHAGEAL ECHOCARDIOGRAM (TEE) (N/A) today. The various methods of treatment have been discussed with the patient. After consideration of the risks, benefits and treatment options the patient has consented to the planned procedure.   The patient has been seen and labs reviewed. There are no changes in the patient's condition to prevent proceeding with the planned procedure today.  Recent labs:  Lab Results  Component Value Date   WBC 8.2 08/17/2017   HGB 14.6 08/17/2017   HCT 45.4 08/17/2017   PLT 113 (L) 08/17/2017   GLUCOSE 110 (H) 08/17/2017   CHOL 127 08/16/2017   TRIG 118 08/16/2017   HDL 43 08/16/2017   LDLCALC 60 08/16/2017   ALT 24 08/16/2017   AST 23 08/16/2017   NA 142 08/17/2017   K 5.3 (H) 08/17/2017   CL 109 08/17/2017   CREATININE 0.92 08/17/2017   BUN 15 08/17/2017   CO2 27 08/17/2017   INR 1.04 08/16/2017   HGBA1C 5.5 08/16/2017    Kerin Perna III, MD 08/17/2017 7:01 AM

## 2017-08-17 NOTE — Anesthesia Procedure Notes (Signed)
Central Venous Catheter Insertion Performed by: Kipp Brood, MD, anesthesiologist Start/End5/22/2019 7:10 AM, 08/17/2017 7:20 AM Patient location: Pre-op. Preanesthetic checklist: patient identified, IV checked, site marked, risks and benefits discussed, surgical consent, monitors and equipment checked, pre-op evaluation, timeout performed and anesthesia consent Lidocaine 1% used for infiltration and patient sedated Hand hygiene performed  and maximum sterile barriers used  Catheter size: 8.5 Fr Sheath introducer Procedure performed using ultrasound guided technique. Ultrasound Notes:anatomy identified, needle tip was noted to be adjacent to the nerve/plexus identified, no ultrasound evidence of intravascular and/or intraneural injection and image(s) printed for medical record Attempts: 1 Following insertion, line sutured and dressing applied. Post procedure assessment: blood return through all ports, free fluid flow and no air  Patient tolerated the procedure well with no immediate complications.

## 2017-08-17 NOTE — Progress Notes (Signed)
Pt transferred to pre-op.

## 2017-08-17 NOTE — Op Note (Signed)
NAME: Jordan Hebert, Jordan Hebert MEDICAL RECORD RU:[id number] ACCOUNT [id number] DATE OF BIRTH:05-14-1940 FACILITY: [facility] LOCATION: [facility] PHYSICIAN:PETER VAN TRIGT III, MD  OPERATIVE REPORT  DATE OF PROCEDURE:  08/17/2017  PROCEDURE PERFORMED: 1.  Coronary artery bypass grafting x4 (left internal mammary artery to diagonal, saphenous vein graft to ramus intermedius, saphenous vein graft to circumflex marginal, saphenous vein graft to posterior descending). 2.  Aortic valve replacement with a 23 mm pericardial Edwards tissue valve (Inspiris, serial number [id number]). 3.  Endoscopic harvest of right leg greater saphenous vein.  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  POSTOPERATIVE DIAGNOSES:  Non-ST elevation myocardial infarction, unstable angina, 95% ulcerated left main stenosis, moderate left ventricular dysfunction, moderate aortic stenosis, chronic obstructive pulmonary disease with active smoking.  ANESTHESIA:  General by Kipp Brood, MD  INDICATIONS:  The patient is an obese 77 year old nondiabetic smoker, presented to the hospital with chest pains of several days' duration and positive cardiac enzymes.  He was placed on heparin, nitroglycerin, and underwent cardiac catheterization by  Dr. Tresa Endo.  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  Ejection fraction was 35%.  The aortic valve could not be crossed and was assessed by 2D echo which showed a calculated valve area of 0.8 consistent with  moderate to severe aortic stenosis.  I examined the patient in the cardiac catheterization lab and reviewed the catheterization data with the patient's cardiologist, Dr. Tresa Endo, for coordination of care.   I discussed the procedure of CABG and AVR with the patient, as well as his family  members in the cath lab holding area.  They understood that the plan would be for a bioprosthetic valve if aortic valve replacement was needed.  I discussed the rationale for the surgery including improved survival and improved quality of life, as well  as the risks of the surgery including the risks of stroke, bleeding, postoperative pulmonary problems including pleural effusion, postoperative infection, postoperative organ failure and death.  He demonstrated his understanding and agreed to proceed  with surgery under what I felt was informed consent.  DESCRIPTION OF PROCEDURE:  The patient was brought to the operating room and placed supine on the operating table where general anesthesia was induced under invasive hemodynamic monitoring.  The chest, abdomen and legs were prepped with Betadine and  draped as a sterile field.  A proper time-out was performed.  An echocardiogram was placed by the anesthesiologist.  A sternal incision was made as the saphenous vein was harvested endoscopically from the right leg.  The left internal mammary artery was harvested as a pedicle graft from its origin at the subclavian vessels.  The sternal retractor was placed, and the  pericardium was opened and suspended.  Pursestrings were placed in the ascending aorta and right atrium.  After the vein was harvested, the patient was heparinized and cannulated and placed on bypass.  A left ventricular vent was placed via the right  superior pulmonary vein.  Cardioplegia cannulas were placed for both antegrade and retrograde cold blood cardioplegia.  The coronaries were identified for grafting.  The LAD was very small, atretic distally.  More proximally, it was deeply intramyocardial underneath a thick layer of epicardial fat, and since it communicated with the diagonal, the mammary target was to the  diagonal, which was a dominant vessel in  the anterior wall.  The other targets, including the posterior descending, ramus and circumflex marginal, were small, suboptimal but adequate targets for grafting.  The patient was cooled to 32 degrees, and the aortic crossclamp was applied.  A liter of cold blood cardioplegia was delivered in split doses through the antegrade, aortic retrograde coronary sinus catheters.  There was good cardioplegic arrest, and then  ____ temperature dropped to less than 12 degrees.  Cardioplegia was delivered every 20 minutes.  The distal coronary anastomoses were performed.  The first distal anastomosis was the posterior descending.  This was a 1.5 mm vessel with a proximal 75% stenosis.  A reverse saphenous vein of small caliber was sewn end-to-side with running 7-0 Prolene,  and there was good flow through the graft.  Cardioplegia was redosed.  The second distal anastomosis was the circumflex marginal branch of the left coronary.  This was totally occluded.  There was also a small vessel.  A reverse saphenous vein was sewn end-to-side with running 7-0 Prolene with good flow through the graft.   Cardioplegia was redosed.  The third distal anastomosis was the ramus branch of the left coronary.  This had a proximal 95% left main stenosis.  A reverse saphenous vein was sewn end-to-side with running 7-0 Prolene with good flow through graft.  Cardioplegia was redosed.  The fourth distal anastomosis was the large diagonal branch.  This was a 1.5 mm vessel.  The left IMA pedicle was brought through an opening in the left lateral pericardium and was brought down onto the diagonal and sewn end-to-side with a running 8-0  Prolene.  There was good flow through the anastomosis after briefly releasing the bulldog pedicle clamp on the mammary pedicle.  The bulldog was reapplied, and the pedicle was secured to the epicardium with Prolene sutures.  Cardioplegia was redosed.  Attention was then directed to the aortic valve.  A  transverse aortotomy was performed.  The aortic valve was inspected.  It was trileaflet, heavily calcified, thickened and stenotic.  The leaflets were excised.  The annulus was debrided of calcium.  The  outflow tract was irrigated with copious amounts of cold saline.  The annulus was sized to a 23 mm pericardial tissue valve.  Subannular 2-0 Ethibond sutures were placed around the annulus.  The valve was then brought to the operative field, and the  sutures were placed in the sewing ring.  The valve was seated and the sutures were tied.  The valve conformed well to the annulus, and there was no obstruction of the coronary ostia.  The aortotomy was closed in layers using running 4-0 Prolene.   Cardioplegia was redosed.  While the cross clamp was still in place, 3 proximal vein anastomoses were performed on the ascending aorta using a 4.0 mm punch and running 6-0 Prolene.  Prior to tying down the final proximal anastomosis, air was vented from the coronaries with a dose  of retrograde warm blood cardioplegia.  The crossclamp was removed.  The heart resumed a spontaneous rhythm.  The vein grafts were deaired and opened, and each had good flow, and hemostasis was documented at the proximal and distal anastomoses.  The retrograde and antegrade cardioplegia cannulas had been removed.  The patient was rewarmed and reperfused.  Temporary pacing wires were applied.  The LV vent was removed.  The lung was reexpanded, and the ventilator was resumed.  The patient was placed on low-dose milrinone and  dopamine.  The patient was then weaned  off cardiopulmonary bypass without difficulty.  The echo showed improved global LV function.  Hemodynamics were stable.  Protamine was administered.  The patient had a postoperative coagulopathy with platelet count down to 60,000.  He received platelets and FFP after reversal of heparin with protamine.  This improved the coagulation function.  Anterior mediastinal and left  pleural chest tubes were placed and brought out through separate incisions.  The sternum was closed with wires.  The patient remained stable.  The pectoralis fascia was closed.  The subcutaneous and skin layers were closed with a running Vicryl.  Sterile dressings were applied.  Total cardiopulmonary bypass time was 194 minutes.  LN/NUANCE  D:08/17/2017 T:08/17/2017 JOB:[id number]

## 2017-08-18 ENCOUNTER — Other Ambulatory Visit: Payer: Self-pay

## 2017-08-18 ENCOUNTER — Inpatient Hospital Stay (HOSPITAL_COMMUNITY): Payer: PPO

## 2017-08-18 ENCOUNTER — Encounter (HOSPITAL_COMMUNITY): Payer: Self-pay | Admitting: Cardiothoracic Surgery

## 2017-08-18 DIAGNOSIS — Z951 Presence of aortocoronary bypass graft: Secondary | ICD-10-CM

## 2017-08-18 LAB — POCT I-STAT, CHEM 8
BUN: 10 mg/dL (ref 6–20)
BUN: 9 mg/dL (ref 6–20)
CALCIUM ION: 1.14 mmol/L — AB (ref 1.15–1.40)
CHLORIDE: 106 mmol/L (ref 101–111)
CREATININE: 0.7 mg/dL (ref 0.61–1.24)
CREATININE: 0.6 mg/dL — AB (ref 0.61–1.24)
Calcium, Ion: 1.18 mmol/L (ref 1.15–1.40)
Chloride: 101 mmol/L (ref 101–111)
GLUCOSE: 144 mg/dL — AB (ref 65–99)
GLUCOSE: 187 mg/dL — AB (ref 65–99)
HCT: 28 % — ABNORMAL LOW (ref 39.0–52.0)
HEMATOCRIT: 30 % — AB (ref 39.0–52.0)
Hemoglobin: 10.2 g/dL — ABNORMAL LOW (ref 13.0–17.0)
Hemoglobin: 9.5 g/dL — ABNORMAL LOW (ref 13.0–17.0)
POTASSIUM: 4.4 mmol/L (ref 3.5–5.1)
Potassium: 4.4 mmol/L (ref 3.5–5.1)
SODIUM: 142 mmol/L (ref 135–145)
Sodium: 139 mmol/L (ref 135–145)
TCO2: 23 mmol/L (ref 22–32)
TCO2: 25 mmol/L (ref 22–32)

## 2017-08-18 LAB — BPAM FFP
BLOOD PRODUCT EXPIRATION DATE: 201905272359
Blood Product Expiration Date: 201905272359
ISSUE DATE / TIME: 201905221409
ISSUE DATE / TIME: 201905221409
UNIT TYPE AND RH: 6200
Unit Type and Rh: 6200

## 2017-08-18 LAB — GLUCOSE, CAPILLARY
GLUCOSE-CAPILLARY: 122 mg/dL — AB (ref 65–99)
GLUCOSE-CAPILLARY: 124 mg/dL — AB (ref 65–99)
GLUCOSE-CAPILLARY: 125 mg/dL — AB (ref 65–99)
GLUCOSE-CAPILLARY: 156 mg/dL — AB (ref 65–99)
GLUCOSE-CAPILLARY: 181 mg/dL — AB (ref 65–99)
Glucose-Capillary: 169 mg/dL — ABNORMAL HIGH (ref 65–99)
Glucose-Capillary: 126 mg/dL — ABNORMAL HIGH (ref 65–99)
Glucose-Capillary: 134 mg/dL — ABNORMAL HIGH (ref 65–99)
Glucose-Capillary: 131 mg/dL — ABNORMAL HIGH (ref 65–99)
Glucose-Capillary: 127 mg/dL — ABNORMAL HIGH (ref 65–99)
Glucose-Capillary: 148 mg/dL — ABNORMAL HIGH (ref 65–99)
Glucose-Capillary: 133 mg/dL — ABNORMAL HIGH (ref 65–99)
Glucose-Capillary: 189 mg/dL — ABNORMAL HIGH (ref 65–99)
Glucose-Capillary: 181 mg/dL — ABNORMAL HIGH (ref 65–99)

## 2017-08-18 LAB — POCT I-STAT 3, ART BLOOD GAS (G3+)
ACID-BASE DEFICIT: 1 mmol/L (ref 0.0–2.0)
ACID-BASE DEFICIT: 3 mmol/L — AB (ref 0.0–2.0)
ACID-BASE DEFICIT: 3 mmol/L — AB (ref 0.0–2.0)
ACID-BASE DEFICIT: 4 mmol/L — AB (ref 0.0–2.0)
ACID-BASE DEFICIT: 4 mmol/L — AB (ref 0.0–2.0)
Acid-base deficit: 3 mmol/L — ABNORMAL HIGH (ref 0.0–2.0)
Acid-base deficit: 2 mmol/L (ref 0.0–2.0)
BICARBONATE: 24.6 mmol/L (ref 20.0–28.0)
BICARBONATE: 23.5 mmol/L (ref 20.0–28.0)
Bicarbonate: 22.3 mmol/L (ref 20.0–28.0)
Bicarbonate: 23 mmol/L (ref 20.0–28.0)
Bicarbonate: 24.4 mmol/L (ref 20.0–28.0)
Bicarbonate: 24.6 mmol/L (ref 20.0–28.0)
Bicarbonate: 22.3 mmol/L (ref 20.0–28.0)
Bicarbonate: 26.1 mmol/L (ref 20.0–28.0)
O2 SAT: 91 %
O2 SAT: 91 %
O2 SAT: 93 %
O2 SAT: 94 %
O2 Saturation: 91 %
O2 Saturation: 92 %
O2 Saturation: 94 %
O2 Saturation: 96 %
PCO2 ART: 52.1 mmHg — AB (ref 32.0–48.0)
PH ART: 7.336 — AB (ref 7.350–7.450)
PO2 ART: 68 mmHg — AB (ref 83.0–108.0)
PO2 ART: 74 mmHg — AB (ref 83.0–108.0)
Patient temperature: 36.7
Patient temperature: 36.9
Patient temperature: 36.7
TCO2: 24 mmol/L (ref 22–32)
TCO2: 26 mmol/L (ref 22–32)
TCO2: 26 mmol/L (ref 22–32)
TCO2: 24 mmol/L (ref 22–32)
TCO2: 26 mmol/L (ref 22–32)
TCO2: 25 mmol/L (ref 22–32)
TCO2: 28 mmol/L (ref 22–32)
TCO2: 24 mmol/L (ref 22–32)
pCO2 arterial: 44.1 mmHg (ref 32.0–48.0)
pCO2 arterial: 46.1 mmHg (ref 32.0–48.0)
pCO2 arterial: 51.8 mmHg — ABNORMAL HIGH (ref 32.0–48.0)
pCO2 arterial: 45.9 mmHg (ref 32.0–48.0)
pCO2 arterial: 47.2 mmHg (ref 32.0–48.0)
pCO2 arterial: 45.6 mmHg (ref 32.0–48.0)
pCO2 arterial: 49.6 mmHg — ABNORMAL HIGH (ref 32.0–48.0)
pH, Arterial: 7.277 — ABNORMAL LOW (ref 7.350–7.450)
pH, Arterial: 7.295 — ABNORMAL LOW (ref 7.350–7.450)
pH, Arterial: 7.327 — ABNORMAL LOW (ref 7.350–7.450)
pH, Arterial: 7.283 — ABNORMAL LOW (ref 7.350–7.450)
pH, Arterial: 7.282 — ABNORMAL LOW (ref 7.350–7.450)
pH, Arterial: 7.32 — ABNORMAL LOW (ref 7.350–7.450)
pH, Arterial: 7.327 — ABNORMAL LOW (ref 7.350–7.450)
pO2, Arterial: 70 mmHg — ABNORMAL LOW (ref 83.0–108.0)
pO2, Arterial: 70 mmHg — ABNORMAL LOW (ref 83.0–108.0)
pO2, Arterial: 72 mmHg — ABNORMAL LOW (ref 83.0–108.0)
pO2, Arterial: 75 mmHg — ABNORMAL LOW (ref 83.0–108.0)
pO2, Arterial: 70 mmHg — ABNORMAL LOW (ref 83.0–108.0)
pO2, Arterial: 87 mmHg (ref 83.0–108.0)

## 2017-08-18 LAB — PREPARE FRESH FROZEN PLASMA: Unit division: 0

## 2017-08-18 LAB — CBC
HCT: 30.8 % — ABNORMAL LOW (ref 39.0–52.0)
HEMATOCRIT: 30.6 % — AB (ref 39.0–52.0)
HEMOGLOBIN: 10 g/dL — AB (ref 13.0–17.0)
Hemoglobin: 10.2 g/dL — ABNORMAL LOW (ref 13.0–17.0)
MCH: 32.7 pg (ref 26.0–34.0)
MCH: 32.8 pg (ref 26.0–34.0)
MCHC: 32.7 g/dL (ref 30.0–36.0)
MCHC: 33.1 g/dL (ref 30.0–36.0)
MCV: 100 fL (ref 78.0–100.0)
MCV: 99 fL (ref 78.0–100.0)
PLATELETS: 72 10*3/uL — AB (ref 150–400)
Platelets: 94 10*3/uL — ABNORMAL LOW (ref 150–400)
RBC: 3.06 MIL/uL — ABNORMAL LOW (ref 4.22–5.81)
RBC: 3.11 MIL/uL — AB (ref 4.22–5.81)
RDW: 13.3 % (ref 11.5–15.5)
RDW: 13.3 % (ref 11.5–15.5)
WBC: 10.2 10*3/uL (ref 4.0–10.5)
WBC: 13.2 10*3/uL — ABNORMAL HIGH (ref 4.0–10.5)

## 2017-08-18 LAB — COMPREHENSIVE METABOLIC PANEL
ALT: 17 U/L (ref 17–63)
AST: 47 U/L — ABNORMAL HIGH (ref 15–41)
Albumin: 3.1 g/dL — ABNORMAL LOW (ref 3.5–5.0)
Alkaline Phosphatase: 31 U/L — ABNORMAL LOW (ref 38–126)
Anion gap: 6 (ref 5–15)
BUN: 10 mg/dL (ref 6–20)
CO2: 25 mmol/L (ref 22–32)
Calcium: 7.6 mg/dL — ABNORMAL LOW (ref 8.9–10.3)
Chloride: 109 mmol/L (ref 101–111)
Creatinine, Ser: 0.91 mg/dL (ref 0.61–1.24)
GFR calc Af Amer: >60 mL/min (ref >60)
GFR calc non Af Amer: >60 mL/min (ref >60)
Glucose, Bld: 131 mg/dL — ABNORMAL HIGH (ref 65–99)
Potassium: 4.1 mmol/L (ref 3.5–5.1)
Sodium: 140 mmol/L (ref 135–145)
Total Bilirubin: 0.9 mg/dL (ref 0.3–1.2)
Total Protein: 5 g/dL — ABNORMAL LOW (ref 6.5–8.1)

## 2017-08-18 LAB — PREPARE PLATELET PHERESIS: Unit division: 0

## 2017-08-18 LAB — CG4 I-STAT (LACTIC ACID): Lactic Acid, Venous: 1.15 mmol/L (ref 0.5–1.9)

## 2017-08-18 LAB — BPAM PLATELET PHERESIS
Blood Product Expiration Date: 201905222359
ISSUE DATE / TIME: 201905221406
Unit Type and Rh: 6200

## 2017-08-18 LAB — MAGNESIUM
Magnesium: 2.4 mg/dL (ref 1.7–2.4)
Magnesium: 2.1 mg/dL (ref 1.7–2.4)

## 2017-08-18 LAB — CREATININE, SERUM
Creatinine, Ser: 0.84 mg/dL (ref 0.61–1.24)
GFR calc Af Amer: >60 mL/min (ref >60)
GFR calc non Af Amer: >60 mL/min (ref >60)

## 2017-08-18 MED ORDER — SODIUM CHLORIDE 0.9 % IV SOLN
1.0000 g | Freq: Once | INTRAVENOUS | Status: AC
  Administered 2017-08-18: 1 g via INTRAVENOUS
  Filled 2017-08-18: qty 10

## 2017-08-18 MED ORDER — MORPHINE SULFATE (PF) 2 MG/ML IV SOLN: 2.0000 mg | INTRAVENOUS | Status: DC | PRN

## 2017-08-18 MED ORDER — INSULIN ASPART 100 UNIT/ML ~~LOC~~ SOLN: 0.0000 [IU] | SUBCUTANEOUS | Status: DC

## 2017-08-18 MED ORDER — INSULIN DETEMIR 100 UNIT/ML ~~LOC~~ SOLN
10.0000 [IU] | Freq: Two times a day (BID) | SUBCUTANEOUS | Status: DC
  Administered 2017-08-18 – 2017-08-20 (×6): 10 [IU] via SUBCUTANEOUS
  Filled 2017-08-18 (×8): qty 0.1

## 2017-08-18 MED ORDER — LEVALBUTEROL HCL 1.25 MG/0.5ML IN NEBU
1.2500 mg | INHALATION_SOLUTION | Freq: Four times a day (QID) | RESPIRATORY_TRACT | Status: DC
  Administered 2017-08-18 (×3): 1.25 mg via RESPIRATORY_TRACT
  Filled 2017-08-18 (×3): qty 0.5

## 2017-08-18 MED ORDER — FUROSEMIDE 10 MG/ML IJ SOLN
20.0000 mg | Freq: Two times a day (BID) | INTRAMUSCULAR | Status: DC
  Administered 2017-08-18 – 2017-08-20 (×5): 20 mg via INTRAVENOUS
  Filled 2017-08-18 (×5): qty 2

## 2017-08-18 MED ORDER — METHYLPREDNISOLONE SODIUM SUCC 125 MG IJ SOLR
80.0000 mg | Freq: Once | INTRAMUSCULAR | Status: AC
  Administered 2017-08-18: 80 mg via INTRAVENOUS
  Filled 2017-08-18: qty 2

## 2017-08-18 MED ORDER — GUAIFENESIN ER 600 MG PO TB12
600.0000 mg | ORAL_TABLET | Freq: Two times a day (BID) | ORAL | Status: DC
  Administered 2017-08-18 – 2017-08-24 (×13): 600 mg via ORAL
  Filled 2017-08-18 (×13): qty 1

## 2017-08-18 MED ORDER — CHLORHEXIDINE GLUCONATE CLOTH 2 % EX PADS
6.0000 | MEDICATED_PAD | Freq: Every day | CUTANEOUS | Status: DC
  Administered 2017-08-18 – 2017-08-24 (×6): 6 via TOPICAL

## 2017-08-18 MED ORDER — SODIUM CHLORIDE 0.9% FLUSH
10.0000 mL | Freq: Two times a day (BID) | INTRAVENOUS | Status: DC
  Administered 2017-08-18 – 2017-08-21 (×6): 10 mL
  Administered 2017-08-21: 20 mL
  Administered 2017-08-22: 10 mL
  Administered 2017-08-22: 20 mL

## 2017-08-18 MED ORDER — METHYLPREDNISOLONE SODIUM SUCC 125 MG IJ SOLR
80.0000 mg | INTRAMUSCULAR | Status: AC
  Administered 2017-08-18 (×3): 80 mg via INTRAVENOUS
  Filled 2017-08-18 (×3): qty 2

## 2017-08-18 MED ORDER — FUROSEMIDE 10 MG/ML IJ SOLN
20.0000 mg | Freq: Once | INTRAMUSCULAR | Status: AC
  Administered 2017-08-18: 20 mg via INTRAVENOUS
  Filled 2017-08-18: qty 2

## 2017-08-18 MED ORDER — LEVALBUTEROL HCL 1.25 MG/0.5ML IN NEBU
1.2500 mg | INHALATION_SOLUTION | Freq: Four times a day (QID) | RESPIRATORY_TRACT | Status: DC | PRN
Start: 2017-08-18 — End: 2017-08-24

## 2017-08-18 MED ORDER — SODIUM CHLORIDE 0.9% FLUSH: 10.0000 mL | INTRAVENOUS | Status: DC | PRN

## 2017-08-18 MED ORDER — ORAL CARE MOUTH RINSE
15.0000 mL | Freq: Two times a day (BID) | OROMUCOSAL | Status: DC
  Administered 2017-08-18 – 2017-08-19 (×2): 15 mL via OROMUCOSAL

## 2017-08-18 MED ORDER — FUROSEMIDE 10 MG/ML IJ SOLN
40.0000 mg | Freq: Once | INTRAMUSCULAR | Status: DC
  Administered 2017-08-18: 40 mg via INTRAVENOUS

## 2017-08-18 MED ORDER — INSULIN ASPART 100 UNIT/ML ~~LOC~~ SOLN
0.0000 [IU] | SUBCUTANEOUS | Status: DC
  Administered 2017-08-18: 2 [IU] via SUBCUTANEOUS
  Administered 2017-08-18 (×2): 4 [IU] via SUBCUTANEOUS
  Administered 2017-08-19: 2 [IU] via SUBCUTANEOUS
  Administered 2017-08-19: 4 [IU] via SUBCUTANEOUS
  Administered 2017-08-19 (×2): 2 [IU] via SUBCUTANEOUS
  Administered 2017-08-19: 4 [IU] via SUBCUTANEOUS
  Administered 2017-08-20: 2 [IU] via SUBCUTANEOUS

## 2017-08-18 MED ORDER — LEVALBUTEROL HCL 1.25 MG/0.5ML IN NEBU
1.2500 mg | INHALATION_SOLUTION | Freq: Three times a day (TID) | RESPIRATORY_TRACT | Status: DC
  Administered 2017-08-19 – 2017-08-20 (×4): 1.25 mg via RESPIRATORY_TRACT
  Filled 2017-08-18 (×4): qty 0.5

## 2017-08-18 NOTE — Care Management Note (Signed)
Case Management Note Donn Pierini RN,BSN Unit [facility] [address] Case Manager  [phone]  Patient Details  Name: KALIM KISSEL MRN: [id number] Date of Birth: 08-02-1940  Subjective/Objective:   Pt admitted NSTEMI, s/p CABGx4 on 5/22                 Action/Plan: PTA pt lived at home with spouse- CM to follow for transition of care needs  Expected Discharge Date:                  Expected Discharge Plan:  Home/Self Care  In-House Referral:     Discharge planning Services  CM Consult  Post Acute Care Choice:    Choice offered to:     DME Arranged:    DME Agency:     HH Arranged:    HH Agency:     Status of Service:  In process, will continue to follow  If discussed at Long Length of Stay Meetings, dates discussed:    Discharge Disposition:   Additional Comments:  Darrold Span, RN 08/18/2017, 10:36 AM

## 2017-08-18 NOTE — Progress Notes (Signed)
Dr. Donata Clay made aware of ABG results and Levo at with MAPS 60s-65. Mr. Canner is A&O x4, pulls 1000 on IS. Lung sounds are clear/dim. O2 sat 100% on NRB. Respiratory rate 16-22. Orders received to place him on bipap with a rate of 16, 12/6; increase Dopamine to , Levophed ceiling increased, Solumedrol  IV x 1 dose, recheck ABG in 1 hour, and to get serial ABGs every 2 hours. Will carry out orders, and continue to closely monitor. Thresa Ross RN

## 2017-08-18 NOTE — Progress Notes (Signed)
1 Day Post-Op Procedure(s) (LRB): CORONARY ARTERY BYPASS GRAFTING (CABG) x 4 , USING LEFT INTERNAL MAMMARY ARTERY AND GREATER SAPHENOUS VEIN HARVESTED ENDOSCOPICALLY (N/A) AORTIC VALVE REPLACEMENT (AVR) WITH INSPIRIS AORTIC VALVE (N/A) TRANSESOPHAGEAL ECHOCARDIOGRAM (TEE) (N/A) Subjective: Hypercarbic after extubation with rhonchi and wheeze CXR mild edema Hemodynamics stable - atrial paced Objective: Vital signs in last 24 hours: Temp:  [95.7 F (35.4 C)-99.5 F (37.5 C)] 98.2 F (36.8 C) (05/23 0733) Pulse Rate:  [82-116] 89 (05/23 0733) Cardiac Rhythm: Atrial paced (05/23 0700) Resp:  [11-34] 17 (05/23 0733) BP: (76-105)/(46-65) 105/60 (05/23 0700) SpO2:  [91 %-100 %] 94 % (05/23 0733) Arterial Line BP: (88-136)/(44-76) 136/53 (05/23 0733) FiO2 (%):  [40 %-100 %] 40 % (05/23 0700) Weight:  [241 lb 6.5 oz (109.5 kg)] 241 lb 6.5 oz (109.5 kg) (05/23 0415)  Hemodynamic parameters for last 24 hours: PAP: (20-41)/(4-28) 31/17 CO:  [4.7 L/min-6.8 L/min] 6.7 L/min CI:  [2.1 L/min/m2-3 L/min/m2] 3 L/min/m2  Intake/Output from previous day: 05/22 0701 - 05/23 0700 In: 6346.5 [I.V.:4301.5; ZOXWR:6045; IV Piggyback:800] Out: 4685 [Urine:3465; Blood:700; Chest Tube:520] Intake/Output this shift: Total I/O In: 122 [I.V.:122] Out: 110 [Urine:60; Chest Tube:50]       Exam    General- alert and comfortable on BIPAP    Neck- no JVD, no cervical adenopathy palpable, no carotid bruit   Lungs- \coarse BS with wheeze   Cor- regular rate and rhythm, no murmur , gallop   Abdomen- soft, non-tender   Extremities - warm, non-tender, minimal edema   Neuro- oriented, appropriate, no focal weakness   Lab Results: Recent Labs    08/17/17 2138 08/18/17 0436  WBC 11.5* 10.2  HGB 10.7* 10.0*  HCT 32.2* 30.6*  PLT 105* 94*   BMET:  Recent Labs    08/17/17 0503  08/17/17 2136 08/17/17 2138 08/18/17 0436  NA 142   < > 142  --  140  K 5.3*   < > 4.4  --  4.1  CL 109   < > 106   --  109  CO2 27  --   --   --  25  GLUCOSE 110*   < > 144*  --  131*  BUN 15   < > 10  --  10  CREATININE 0.92   < > 0.70 0.86 0.91  CALCIUM 9.1  --   --   --  7.6*   < > = values in this interval not displayed.    PT/INR:  Recent Labs    08/16/17 1913  LABPROT 13.5  INR 1.04   ABG    Component Value Date/Time   PHART 7.283 (L) 08/18/2017 0653   HCO3 24.6 08/18/2017 0653   TCO2 26 08/18/2017 0653   ACIDBASEDEF 2.0 08/18/2017 0653   O2SAT 91.0 08/18/2017 0653   CBG (last 3)  Recent Labs    08/18/17 0505 08/18/17 0612 08/18/17 0651  GLUCAP 131* 125* 122*    Assessment/Plan: S/P Procedure(s) (LRB): CORONARY ARTERY BYPASS GRAFTING (CABG) x 4 , USING LEFT INTERNAL MAMMARY ARTERY AND GREATER SAPHENOUS VEIN HARVESTED ENDOSCOPICALLY (N/A) AORTIC VALVE REPLACEMENT (AVR) WITH INSPIRIS AORTIC VALVE (N/A) TRANSESOPHAGEAL ECHOCARDIOGRAM (TEE) (N/A) Diuresis Diabetes control See progression orders BIPAP ABG Q 4   LOS: 3 days    Kathlee Nations Trigt III 08/18/2017

## 2017-08-18 NOTE — Progress Notes (Signed)
Patient ID: Jordan Hebert, male   DOB: June 08, 1940, 77 y.o.   MRN: [id number] EVENING ROUNDS NOTE :     [address]       [place] [id number]             [phone]                 1 Day Post-Op Procedure(s) (LRB): CORONARY ARTERY BYPASS GRAFTING (CABG) x 4 , USING LEFT INTERNAL MAMMARY ARTERY AND GREATER SAPHENOUS VEIN HARVESTED ENDOSCOPICALLY (N/A) AORTIC VALVE REPLACEMENT (AVR) WITH INSPIRIS AORTIC VALVE (N/A) TRANSESOPHAGEAL ECHOCARDIOGRAM (TEE) (N/A)  Total Length of Stay:  LOS: 3 days  BP 121/71   Pulse 90   Temp 97.9 F (36.6 C)   Resp 14   Ht  (1.778 m)   Wt 241 lb 6.5 oz (109.5 kg)   SpO2 96%   BMI 34.64 kg/m   .Intake/Output      05/22 0701 - 05/23 0700 05/23 0701 - 05/24 0700   I.V. (mL/kg) 4301.5 (39.3) 348.2 (3.2)   Blood 1245    IV Piggyback 800 560   Total Intake(mL/kg) 6346.5 (58) 908.2 (8.3)   Urine (mL/kg/hr) 3465 (1.3) 965 (0.8)   Stool     Blood 700    Chest Tube 520 104   Total Output 4685 1069   Net +1661.5 -160.8          . sodium chloride 20 mL/hr at 08/17/17 1900  . sodium chloride    . sodium chloride 20 mL/hr at 08/17/17 1900  . sodium chloride    . cefUROXime (ZINACEF)  IV 1.5 g (08/18/17 0618)  . DOPamine 7 mcg/kg/min (08/18/17 1300)  . insulin (NOVOLIN-R) infusion 5 Units/hr (08/18/17 0500)  . lactated ringers    . lactated ringers 20 mL (08/18/17 0242)  . milrinone 0.25 mcg/kg/min (08/18/17 1300)  . nitroGLYCERIN    . norepinephrine (LEVOPHED) Adult infusion 4 mcg/min (08/18/17 1300)     Lab Results  Component Value Date   WBC 10.2 08/18/2017   HGB 9.5 (L) 08/18/2017   HCT 28.0 (L) 08/18/2017   PLT 94 (L) 08/18/2017   GLUCOSE 187 (H) 08/18/2017   CHOL 127 08/16/2017   TRIG 118 08/16/2017   HDL 43 08/16/2017   LDLCALC 60 08/16/2017   ALT 17 08/18/2017   AST 47 (H) 08/18/2017   NA 139 08/18/2017   K 4.4 08/18/2017   CL 101 08/18/2017   CREATININE 0.60 (L) 08/18/2017   BUN 9 08/18/2017     CO2 25 08/18/2017   INR 1.04 08/16/2017   HGBA1C 5.5 08/16/2017   Still on high flow o2, off bipapa Give lasix tonight    Delight Ovens MD  Beeper [phone] Office [phone] 08/18/2017 5:58 PM

## 2017-08-18 NOTE — Progress Notes (Signed)
Patient sitting up in recliner with NAD noted.  No complaints verbalized at this time.  Verbalized that "he would like to go back to bed.  Advised would assist back to bed once seen by Dr. Tyrone Sage.  Verbalized understanding.

## 2017-08-18 NOTE — Progress Notes (Signed)
Patient assisted back to bed with RN x2 for safety of invasive lines and to instruct patient on correct positioning for transfer back to bed. Patient return demonstrated correctly.  Gait weak but steady.  NAD noted.  VSS.

## 2017-08-19 ENCOUNTER — Inpatient Hospital Stay (HOSPITAL_COMMUNITY): Payer: PPO

## 2017-08-19 DIAGNOSIS — Z952 Presence of prosthetic heart valve: Secondary | ICD-10-CM

## 2017-08-19 LAB — POCT I-STAT 3, ART BLOOD GAS (G3+)
ACID-BASE EXCESS: 3 mmol/L — AB (ref 0.0–2.0)
Bicarbonate: 26.3 mmol/L (ref 20.0–28.0)
Bicarbonate: 29.1 mmol/L — ABNORMAL HIGH (ref 20.0–28.0)
O2 SAT: 93 %
O2 SAT: 94 %
PCO2 ART: 54.1 mmHg — AB (ref 32.0–48.0)
PH ART: 7.338 — AB (ref 7.350–7.450)
PO2 ART: 76 mmHg — AB (ref 83.0–108.0)
Patient temperature: 98.3
TCO2: 28 mmol/L (ref 22–32)
TCO2: 31 mmol/L (ref 22–32)
pCO2 arterial: 48.1 mmHg — ABNORMAL HIGH (ref 32.0–48.0)
pH, Arterial: 7.344 — ABNORMAL LOW (ref 7.350–7.450)
pO2, Arterial: 70 mmHg — ABNORMAL LOW (ref 83.0–108.0)

## 2017-08-19 LAB — COOXEMETRY PANEL
Carboxyhemoglobin: 0.6 % (ref 0.5–1.5)
Methemoglobin: 1.5 % (ref 0.0–1.5)
O2 Saturation: 66.2 %
Total hemoglobin: 13.6 g/dL (ref 12.0–16.0)

## 2017-08-19 LAB — BASIC METABOLIC PANEL
Anion gap: 6 (ref 5–15)
BUN: 8 mg/dL (ref 6–20)
CO2: 29 mmol/L (ref 22–32)
Calcium: 8.3 mg/dL — ABNORMAL LOW (ref 8.9–10.3)
Chloride: 104 mmol/L (ref 101–111)
Creatinine, Ser: 0.75 mg/dL (ref 0.61–1.24)
GFR calc Af Amer: >60 mL/min (ref >60)
GFR calc non Af Amer: >60 mL/min (ref >60)
Glucose, Bld: 167 mg/dL — ABNORMAL HIGH (ref 65–99)
Potassium: 4.1 mmol/L (ref 3.5–5.1)
Sodium: 139 mmol/L (ref 135–145)

## 2017-08-19 LAB — POCT I-STAT, CHEM 8
BUN: 8 mg/dL (ref 6–20)
CALCIUM ION: 1.26 mmol/L (ref 1.15–1.40)
CHLORIDE: 100 mmol/L — AB (ref 101–111)
CREATININE: 0.7 mg/dL (ref 0.61–1.24)
Glucose, Bld: 165 mg/dL — ABNORMAL HIGH (ref 65–99)
HCT: 26 % — ABNORMAL LOW (ref 39.0–52.0)
Hemoglobin: 8.8 g/dL — ABNORMAL LOW (ref 13.0–17.0)
Potassium: 4 mmol/L (ref 3.5–5.1)
Sodium: 138 mmol/L (ref 135–145)
TCO2: 28 mmol/L (ref 22–32)

## 2017-08-19 LAB — CBC
HCT: 28.8 % — ABNORMAL LOW (ref 39.0–52.0)
Hemoglobin: 9.5 g/dL — ABNORMAL LOW (ref 13.0–17.0)
MCH: 32.5 pg (ref 26.0–34.0)
MCHC: 33 g/dL (ref 30.0–36.0)
MCV: 98.6 fL (ref 78.0–100.0)
Platelets: 59 10*3/uL — ABNORMAL LOW (ref 150–400)
RBC: 2.92 MIL/uL — ABNORMAL LOW (ref 4.22–5.81)
RDW: 13.4 % (ref 11.5–15.5)
WBC: 13.7 10*3/uL — ABNORMAL HIGH (ref 4.0–10.5)

## 2017-08-19 LAB — GLUCOSE, CAPILLARY
GLUCOSE-CAPILLARY: 172 mg/dL — AB (ref 65–99)
GLUCOSE-CAPILLARY: 147 mg/dL — AB (ref 65–99)
GLUCOSE-CAPILLARY: 129 mg/dL — AB (ref 65–99)
Glucose-Capillary: 127 mg/dL — ABNORMAL HIGH (ref 65–99)
Glucose-Capillary: 145 mg/dL — ABNORMAL HIGH (ref 65–99)
Glucose-Capillary: 160 mg/dL — ABNORMAL HIGH (ref 65–99)
Glucose-Capillary: 164 mg/dL — ABNORMAL HIGH (ref 65–99)

## 2017-08-19 MED ORDER — CHLORHEXIDINE GLUCONATE 0.12 % MT SOLN
15.0000 mL | Freq: Two times a day (BID) | OROMUCOSAL | Status: DC
  Administered 2017-08-19: 15 mL via OROMUCOSAL

## 2017-08-19 MED ORDER — ASPIRIN EC 81 MG PO TBEC
81.0000 mg | DELAYED_RELEASE_TABLET | Freq: Every day | ORAL | Status: DC
  Administered 2017-08-19 – 2017-08-21 (×3): 81 mg via ORAL
  Filled 2017-08-19 (×3): qty 1

## 2017-08-19 MED ORDER — CLONAZEPAM 1 MG PO TABS
1.0000 mg | ORAL_TABLET | Freq: Every day | ORAL | Status: DC
  Administered 2017-08-19 – 2017-08-23 (×5): 1 mg via ORAL
  Filled 2017-08-19 (×5): qty 1

## 2017-08-19 MED ORDER — SODIUM CHLORIDE 0.9 % IV SOLN
1.5000 g | Freq: Two times a day (BID) | INTRAVENOUS | Status: AC
  Administered 2017-08-19 – 2017-08-21 (×4): 1.5 g via INTRAVENOUS
  Filled 2017-08-19 (×4): qty 1.5

## 2017-08-19 MED ORDER — ORAL CARE MOUTH RINSE
15.0000 mL | Freq: Two times a day (BID) | OROMUCOSAL | Status: DC
  Administered 2017-08-19: 15 mL via OROMUCOSAL

## 2017-08-19 MED ORDER — ENOXAPARIN SODIUM 40 MG/0.4ML ~~LOC~~ SOLN: 40.0000 mg | SUBCUTANEOUS | Status: DC

## 2017-08-19 MED ORDER — DOPAMINE-DEXTROSE 3.2-5 MG/ML-% IV SOLN
2.5000 ug/kg/min | INTRAVENOUS | Status: DC
  Administered 2017-08-19: 2.5 ug/kg/min via INTRAVENOUS
  Filled 2017-08-19: qty 250

## 2017-08-19 MED FILL — Mannitol IV Soln 20%: INTRAVENOUS | Qty: 500 | Status: AC

## 2017-08-19 MED FILL — Electrolyte-R (PH 7.4) Solution: INTRAVENOUS | Qty: 4000 | Status: AC

## 2017-08-19 MED FILL — Lidocaine HCl Local Soln Prefilled Syringe 100 MG/5ML (2%): INTRAMUSCULAR | Qty: 10 | Status: AC

## 2017-08-19 MED FILL — Sodium Bicarbonate IV Soln 8.4%: INTRAVENOUS | Qty: 50 | Status: AC

## 2017-08-19 MED FILL — Heparin Sodium (Porcine) Inj 1000 Unit/ML: INTRAMUSCULAR | Qty: 30 | Status: AC

## 2017-08-19 MED FILL — Magnesium Sulfate Inj 50%: INTRAMUSCULAR | Qty: 10 | Status: AC

## 2017-08-19 MED FILL — Potassium Chloride Inj 2 mEq/ML: INTRAVENOUS | Qty: 40 | Status: AC

## 2017-08-19 NOTE — Progress Notes (Signed)
2 Days Post-Op Procedure(s) (LRB): CORONARY ARTERY BYPASS GRAFTING (CABG) x 4 , USING LEFT INTERNAL MAMMARY ARTERY AND GREATER SAPHENOUS VEIN HARVESTED ENDOSCOPICALLY (N/A) AORTIC VALVE REPLACEMENT (AVR) WITH INSPIRIS AORTIC VALVE (N/A) TRANSESOPHAGEAL ECHOCARDIOGRAM (TEE) (N/A) Subjective: Up in chair, did not sleep well A.m. PCO2 mains elevated 53 but probably expected for his preoperative pulmonary dysfunction Diuresing well Chest tube drainage tapering off-we will remove Needs physical therapy Remains on low-dose dopamine and milrinone for preoperative moderate LV dysfunction-wean off slowly and check daily a.m. mixed venous saturation   Objective: Vital signs in last 24 hours: Temp:  [97.9 F (36.6 C)-98.8 F (37.1 C)] 98.3 F (36.8 C) (05/24 0400) Pulse Rate:  [89-102] 102 (05/24 0700) Cardiac Rhythm: Normal sinus rhythm (05/24 0400) Resp:  [13-27] 17 (05/24 0700) BP: (90-131)/(60-75) 106/60 (05/24 0700) SpO2:  [93 %-100 %] 93 % (05/24 0700) Arterial Line BP: (115-159)/(46-66) 139/54 (05/24 0700) FiO2 (%):  [40 %] 40 % (05/23 1304) Weight:  [234 lb 9.1 oz (106.4 kg)] 234 lb 9.1 oz (106.4 kg) (05/24 0500)  Hemodynamic parameters for last 24 hours: PAP: (30-36)/(16-23) 31/17 CO:  [7.3 L/min] 7.3 L/min CI:  [3.3 L/min/m2] 3.3 L/min/m2  Intake/Output from previous day: 05/23 0701 - 05/24 0700 In: 2376.6 [P.O.:600; I.V.:1016.6; IV Piggyback:760] Out: 4099 [Urine:3805; Chest Tube:294] Intake/Output this shift: No intake/output data recorded.       Exam    General- alert and comfortable    Neck- no JVD, no cervical adenopathy palpable, no carotid bruit   Lungs- clear without rales, wheezes   Cor- regular rate and rhythm, no murmur , gallop   Abdomen- soft, non-tender   Extremities - warm, non-tender, minimal edema   Neuro- oriented, appropriate, no focal weakness   Lab Results: Recent Labs    08/18/17 1732  08/19/17 0404 08/19/17 0411  WBC 13.2*  --   13.7*  --   HGB 10.2*   < > 9.5* 8.8*  HCT 30.8*   < > 28.8* 26.0*  PLT 72*  --  59*  --    < > = values in this interval not displayed.   BMET:  Recent Labs    08/18/17 0436  08/19/17 0404 08/19/17 0411  NA 140   < > 139 138  K 4.1   < > 4.1 4.0  CL 109   < > 104 100*  CO2 25  --  29  --   GLUCOSE 131*   < > 167* 165*  BUN 10   < > 8 8  CREATININE 0.91   < > 0.75 0.70  CALCIUM 7.6*  --  8.3*  --    < > = values in this interval not displayed.    PT/INR:  Recent Labs    08/16/17 1913  LABPROT 13.5  INR 1.04   ABG    Component Value Date/Time   PHART 7.338 (L) 08/19/2017 0410   HCO3 29.1 (H) 08/19/2017 0410   TCO2 28 08/19/2017 0411   ACIDBASEDEF 3.0 (H) 08/18/2017 1641   O2SAT 94.0 08/19/2017 0410   CBG (last 3)  Recent Labs    08/18/17 2006 08/19/17 0002 08/19/17 0407  GLUCAP 181* 160* 164*    Assessment/Plan: S/P Procedure(s) (LRB): CORONARY ARTERY BYPASS GRAFTING (CABG) x 4 , USING LEFT INTERNAL MAMMARY ARTERY AND GREATER SAPHENOUS VEIN HARVESTED ENDOSCOPICALLY (N/A) AORTIC VALVE REPLACEMENT (AVR) WITH INSPIRIS AORTIC VALVE (N/A) TRANSESOPHAGEAL ECHOCARDIOGRAM (TEE) (N/A) Mobilize DC sleeve, keep 2 port   LOS: 4 days  Jordan Hebert 08/19/2017

## 2017-08-19 NOTE — Progress Notes (Signed)
PT Cancellation Note  Patient Details Name: SENDER RUEB MRN: [id number] DOB: [birth year]   Cancelled Treatment:     Attempted to work with patient at 1500, pt refusing therapy at this time citing he is exhausted and hasn't sleep overnight. Reports he has been up today and will work with PT tomorrow.   Etta Grandchild, PT, DPT Acute Rehab Services Pager: [phone]     Etta Grandchild 08/19/2017, 3:48 PM

## 2017-08-19 NOTE — Progress Notes (Signed)
Patient ID: Jordan Hebert, male   DOB: 09-Nov-1940, 77 y.o.   MRN: [id number] [facility] Evening Rounds:  Hemodynamically stable on milrinone 0.125, dop 2.5.  sats 97% on 10 L HFNC. Urine output ok.

## 2017-08-19 NOTE — Progress Notes (Deleted)
Dr. Lindie Spruce with General Surgery and Wound care nurse at bedside to assess condom cath, penile and scrotal wounds.  Updated on output.  New orders received and noted.

## 2017-08-19 NOTE — Discharge Instructions (Signed)
Endoscopic Saphenous Vein Harvesting, Care After °Refer to this sheet in the next few weeks. These instructions provide you with information about caring for yourself after your procedure. Your health care provider may also give you more specific instructions. Your treatment has been planned according to current medical practices, but problems sometimes occur. Call your health care provider if you have any problems or questions after your procedure. °What can I expect after the procedure? °After the procedure, it is common to have: °· Pain. °· Bruising. °· Swelling. °· Numbness. ° °Follow these instructions at home: °Medicine °· Take over-the-counter and prescription medicines only as told by your health care provider. °· Do not drive or operate heavy machinery while taking prescription pain medicine. °Incision care ° °· Follow instructions from your health care provider about how to take care of the cut made during surgery (incision). Make sure you: °? Wash your hands with soap and water before you change your bandage (dressing). If soap and water are not available, use hand sanitizer. °? Change your dressing as told by your health care provider. °? Leave stitches (sutures), skin glue, or adhesive strips in place. These skin closures may need to be in place for 2 weeks or longer. If adhesive strip edges start to loosen and curl up, you may trim the loose edges. Do not remove adhesive strips completely unless your health care provider tells you to do that. °· Check your incision area every day for signs of infection. Check for: °? More redness, swelling, or pain. °? More fluid or blood. °? Warmth. °? Pus or a bad smell. °General instructions °· Raise (elevate) your legs above the level of your heart while you are sitting or lying down. °· Do any exercises your health care providers have given you. These may include deep breathing, coughing, and walking exercises. °· Do not shower, take baths, swim, or use a hot tub  unless told by your health care provider. °· Wear your elastic stocking if told by your health care provider. °· Keep all follow-up visits as told by your health care provider. This is important. °Contact a health care provider if: °· Medicine does not help your pain. °· Your pain gets worse. °· You have new leg bruises or your leg bruises get bigger. °· You have a fever. °· Your leg feels numb. °· You have more redness, swelling, or pain around your incision. °· You have more fluid or blood coming from your incision. °· Your incision feels warm to the touch. °· You have pus or a bad smell coming from your incision. °Get help right away if: °· Your pain is severe. °· You develop pain, tenderness, warmth, redness, or swelling in any part of your leg. °· You have chest pain. °· You have trouble breathing. °This information is not intended to replace advice given to you by your health care provider. Make sure you discuss any questions you have with your health care provider. °Document Released: 11/25/2010 Document Revised: 08/21/2015 Document Reviewed: 01/27/2015 °Elsevier Interactive Patient Education © 2018 [employer] °Aortic Valve Replacement, Care After °Refer to this sheet in the next few weeks. These instructions provide you with information about caring for yourself after your procedure. Your health care provider may also give you more specific instructions. Your treatment has been planned according to current medical practices, but problems sometimes occur. Call your health care provider if you have any problems or questions after your procedure. °What can I expect after the procedure? °After   the procedure, it is common to have: °· Pain around your incision area. °· A small amount of blood or clear fluid coming from your incision. ° °Follow these instructions at home: °Eating and drinking ° °· Follow instructions from your health care provider about eating or drinking restrictions. °? Limit alcohol intake to  no more than 1 drink per day for nonpregnant women and 2 drinks per day for men. One drink equals 12 oz of beer, 5 oz of wine, or 1½ oz of hard liquor. °? Limit how much caffeine you drink. Caffeine can affect your heart's rate and rhythm. °· Drink enough fluid to keep your urine clear or pale yellow. °· Eat a heart-healthy diet. This should include plenty of fresh fruits and vegetables. If you eat meat, it should be lean cuts. Avoid foods that are: °? High in salt, saturated fat, or sugar. °? Canned or highly processed. °? Fried. °Activity °· Return to your normal activities as told by your health care provider. Ask your health care provider what activities are safe for you. °· Exercise regularly once you have recovered, as told by your health care provider. °· Avoid sitting for more than 2 hours at a time without moving. Get up and move around at least once every 1-2 hours. This helps to prevent blood clots in the legs. °· Do not lift anything that is heavier than 10 lb (4.5 kg) until your health care provider approves. °· Avoid pushing or pulling things with your arms until your health care provider approves. This includes pulling on handrails to help you climb stairs. °Incision care ° °· Follow instructions from your health care provider about how to take care of your incision. Make sure you: °? Wash your hands with soap and water before you change your bandage (dressing). If soap and water are not available, use hand sanitizer. °? Change your dressing as told by your health care provider. °? Leave stitches (sutures), skin glue, or adhesive strips in place. These skin closures may need to stay in place for 2 weeks or longer. If adhesive strip edges start to loosen and curl up, you may trim the loose edges. Do not remove adhesive strips completely unless your health care provider tells you to do that. °· Check your incision area every day for signs of infection. Check for: °? More redness, swelling, or  pain. °? More fluid or blood. °? Warmth. °? Pus or a bad smell. °Medicines °· Take over-the-counter and prescription medicines only as told by your health care provider. °· If you were prescribed an antibiotic medicine, take it as told by your health care provider. Do not stop taking the antibiotic even if you start to feel better. °Travel °· Avoid airplane travel for as long as told by your health care provider. °· When you travel, bring a list of your medicines and a record of your medical history with you. Carry your medicines with you. °Driving °· Ask your health care provider when it is safe for you to drive. Do not drive until your health care provider approves. °· Do not drive or operate heavy machinery while taking prescription pain medicine. °Lifestyle ° °· Do not use any tobacco products, such as cigarettes, chewing tobacco, or e-cigarettes. If you need help quitting, ask your health care provider. °· Resume sexual activity as told by your health care provider. Do not use medicines for erectile dysfunction unless your health care provider approves, if this applies. °· Work with your health   care provider to keep your blood pressure and cholesterol under control, and to manage any other heart conditions that you have. °· Maintain a healthy weight. °General instructions °· Do not take baths, swim, or use a hot tub until your health care provider approves. °· Do not strain to have a bowel movement. °· Avoid crossing your legs while sitting down. °· Check your temperature every day for a fever. A fever may be a sign of infection. °· If you are a woman and you plan to become pregnant, talk with your health care provider before you become pregnant. °· Wear compression stockings if your health care provider instructs you to do this. These stockings help to prevent blood clots and reduce swelling in your legs. °· Tell all health care providers who care for you that you have an artificial (prosthetic) aortic valve.  If you have or have had heart disease or endocarditis, tell all health care providers about these conditions as well. °· Keep all follow-up visits as told by your health care provider. This is important. °Contact a health care provider if: °· You develop a skin rash. °· You experience sudden, unexplained changes in your weight. °· You have more redness, swelling, or pain around your incision. °· You have more fluid or blood coming from your incision. °· Your incision feels warm to the touch. °· You have pus or a bad smell coming from your incision. °· You have a fever. °Get help right away if: °· You develop chest pain that is different from the pain coming from your incision. °· You develop shortness of breath or difficulty breathing. °· You start to feel light-headed. °These symptoms may represent a serious problem that is an emergency. Do not wait to see if the symptoms will go away. Get medical help right away. Call your local emergency services (911 in the [place] Do not drive yourself to the hospital. °This information is not intended to replace advice given to you by your health care provider. Make sure you discuss any questions you have with your health care provider. °Document Released: 10/01/2004 Document Revised: 08/21/2015 Document Reviewed: 02/16/2015 °Elsevier Interactive Patient Education © 2017 [employer] °Coronary Artery Bypass Grafting, Care After °These instructions give you information on caring for yourself after your procedure. Your doctor may also give you more specific instructions. Call your doctor if you have any problems or questions after your procedure. °Follow these instructions at home: °· Only take medicine as told by your doctor. Take medicines exactly as told. Do not stop taking medicines or start any new medicines without talking to your doctor first. °· Take your pulse as told by your doctor. °· Do deep breathing as told by your doctor. Use your breathing device (incentive  spirometer), if given, to practice deep breathing several times a day. Support your chest with a pillow or your arms when you take deep breaths or cough. °· Keep the area clean, dry, and protected where the surgery cuts (incisions) were made. Remove bandages (dressings) only as told by your doctor. If strips were applied to surgical area, do not take them off. They fall off on their own. °· Check the surgery area daily for puffiness (swelling), redness, or leaking fluid. °· If surgery cuts were made in your legs: °? Avoid crossing your legs. °? Avoid sitting for long periods of time. Change positions every 30 minutes. °? Raise your legs when you are sitting. Place them on pillows. °· Wear stockings that help keep   blood clots from forming in your legs (compression stockings). °· Only take sponge baths until your doctor says it is okay to take showers. Pat the surgery area dry. Do not rub the surgery area with a washcloth or towel. Do not bathe, swim, or use a hot tub until your doctor says it is okay. °· Eat foods that are high in fiber. These include raw fruits and vegetables, whole grains, beans, and nuts. Choose lean meats. Avoid canned, processed, and fried foods. °· Drink enough fluids to keep your pee (urine) clear or pale yellow. °· Weigh yourself every day. °· Rest and limit activity as told by your doctor. You may be told to: °? Stop any activity if you have chest pain, shortness of breath, changes in heartbeat, or dizziness. Get help right away if this happens. °? Move around often for short amounts of time or take short walks as told by your doctor. Gradually become more active. You may need help to strengthen your muscles and build endurance. °? Avoid lifting, pushing, or pulling anything heavier than 10 pounds (4.5 kg) for at least 6 weeks after surgery. °· Do not drive until your doctor says it is okay. °· Ask your doctor when you can go back to work. °· Ask your doctor when you can begin sexual  activity again. °· Follow up with your doctor as told. °Contact a doctor if: °· You have puffiness, redness, more pain, or fluid draining from the incision site. °· You have a fever. °· You have puffiness in your ankles or legs. °· You have pain in your legs. °· You gain 2 or more pounds (0.9 kg) a day. °· You feel sick to your stomach (nauseous) or throw up (vomit). °· You have watery poop (diarrhea). °Get help right away if: °· You have chest pain that goes to your jaw or arms. °· You have shortness of breath. °· You have a fast or irregular heartbeat. °· You notice a "clicking" in your breastbone when you move. °· You have numbness or weakness in your arms or legs. °· You feel dizzy or light-headed. °This information is not intended to replace advice given to you by your health care provider. Make sure you discuss any questions you have with your health care provider. °Document Released: 03/20/2013 Document Revised: 08/21/2015 Document Reviewed: 08/22/2012 °Elsevier Interactive Patient Education © 2017 [employer] Inc. ° °

## 2017-08-19 NOTE — Progress Notes (Signed)
Entered room to find pt sitting on side of bed with foley bag on bed, pt pulling on cardiac monitor. Pt easily redirected and assisted back into bed and stated "I was confused." Pt received dose of clonazepam earlier. Will continue to closely monitor pt.  Herma Ard, RN

## 2017-08-19 NOTE — Discharge Summary (Signed)
Physician Discharge Summary  Patient ID: Jordan Hebert MRN: [id number] DOB/AGE: April 05, 1940 77 y.o.  Admit date: 08/15/2017 Discharge date: 08/27/2017  Admission Diagnoses: Non-ST segment elevation myocardial infarction, unstable angina. Aortic stenosis-moderate  Discharge Diagnoses:  Active Problems:   NSTEMI (non-ST elevated myocardial infarction) (HCC)   Aortic valve stenosis   S/P CABG x 4   S/P AVR   Patient Active Problem List   Diagnosis Date Noted  . S/P AVR   . 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  . Aortic valve stenosis   . NSTEMI (non-ST elevated myocardial infarction) (HCC) 08/15/2017  . Unspecified hereditary and idiopathic peripheral neuropathy 06/22/2013   History of present illness:  Patient is a 77 year old obese Caucasian male nondiabetic smoker who was admitted to the hospital with non-STEMI.  Symptoms included chest pain over several days consistent with unstable angina.  Patient has a previous history of coronary artery disease including a stent placement in the LAD and RCA 22 years ago.  He was found on EKG in the emergency department to have ST segment changes and elevated troponin of 0.8 was admitted for further evaluation to include cardiac catheterization.  He was placed on heparin admission and his symptoms resolved.   Discharged Condition: good  Hospital Course: The patient was admitted regimen to include cardiac catheterization.  This was performed and he was found to have severe left main stenosis with an ulcerated plaque, chronic occlusion of the circumflex which fills via collaterals from the distal RCA, and 70% stenosis of the proximal RCA.  An LV gram was not performed because the aortic valve could not be crossed due to possible stenosis.  An echocardiogram then revealed moderate aortic stenosis.  Cardiothoracic surgical consultation was obtained with Kathlee Nations Trigt MD who evaluated the patient and studies and recommended proceeding with CABG/aortic valve  replacement.  Postoperative hospital course:  The patient was extubated without difficulty using standard protocols.  He did become hypercarbic and was subsequently placed on BiPAP.  He is known to have some preoperative pulmonary dysfunction.  He did have some postoperative volume overload but is diuresing well.  Initially has required some low-dose dopamine and milrinone and these were slowly weaned off.  All routine lines, monitors and drainage devices have been discontinued in standard fashion.  Renal function has remained within normal limits.  He does have an expected acute blood loss anemia.  Values appear to be stabilized.  The patient has maintained sinus rhythm with stable blood pressure.  Blood sugars are under good control.  He did have some difficulty with constipation but this is improved with usual measures.  Incisions are healing well without evidence of infection.  He is tolerating routine cardiac rehab phase 1 modalities.  At time of discharge the patient is felt to be quite stable.  Consults: cardiology  Significant Diagnostic Studies: angiography: Via catheterization  Treatments: surgery:  PHYSICIAN:PETER VAN TRIGT III, MD  OPERATIVE REPORT  DATE OF PROCEDURE:  08/17/2017  PROCEDURE PERFORMED: 1.  Coronary artery bypass grafting x4 (left internal mammary artery to diagonal, saphenous vein graft to ramus intermedius, saphenous vein graft to circumflex marginal, saphenous vein graft to posterior descending). 2.  Aortic valve replacement with a 23 mm pericardial Edwards tissue valve (Inspiris, serial number [id number]). 3.  Endoscopic harvest of right leg greater saphenous vein.  SURGEON:  Kerin Perna III, MD  ASSISTANT:  Jari Favre, PA-C   PREOPERATIVE DIAGNOSES:  Non-ST elevation myocardial infarction, unstable angina, 95% ulcerated left main stenosis, moderate  left ventricular dysfunction, moderate aortic stenosis, chronic obstructive pulmonary disease with  active smoking.  POSTOPERATIVE DIAGNOSES:  Non-ST elevation myocardial infarction, unstable angina, 95% ulcerated left main stenosis, moderate left ventricular dysfunction, moderate aortic stenosis, chronic obstructive pulmonary disease with active smoking.  ANESTHESIA:  General by Kipp Brood, MD   Discharge Exam: Blood pressure 113/66, pulse 85, temperature 97.6 F (36.4 C), temperature source Oral, resp. rate (!) 29, height  (1.778 m), weight 102.8 kg (226 lb 10.1 oz), SpO2 93 %.  General appearance: alert, cooperative and no distress Heart: regular rate and rhythm, S1, S2 normal, no murmur, click, rub or gallop Lungs: clear to auscultation bilaterally Abdomen: soft, non-tender; bowel sounds normal; no masses,  no organomegaly Extremities: extremities normal, atraumatic, no cyanosis or edema Wound: clean and dry   Disposition:   Discharge Instructions    Amb Referral to Cardiac Rehabilitation   Complete by:  As directed    Diagnosis:   CABG Valve Replacement     Valve:  Aortic   CABG X ___:  4     Allergies as of 08/24/2017   No Known Allergies     Medication List    STOP taking these medications   naproxen sodium 220 MG tablet Commonly known as:  ALEVE     TAKE these medications   acetaminophen 325 MG tablet Commonly known as:  TYLENOL Take 2 tablets (650 mg total) by mouth every 6 (six) hours as needed for mild pain.   aspirin 325 MG EC tablet Take 1 tablet (325 mg total) by mouth daily. What changed:    medication strength  how much to take   atorvastatin 80 MG tablet Commonly known as:  LIPITOR Take 1 tablet (80 mg total) by mouth daily at 6 PM. What changed:    medication strength  how much to take  when to take this   clopidogrel 75 MG tablet Commonly known as:  PLAVIX Take 1 tablet (75 mg total) by mouth daily.   cyclobenzaprine 10 MG tablet Commonly known as:  FLEXERIL Take 10 mg by mouth 2 (two) times daily as needed for  muscle spasms.   levothyroxine 175 MCG tablet Commonly known as:  SYNTHROID, LEVOTHROID Take 175 mcg by mouth daily before breakfast.   metoprolol tartrate 25 MG tablet Commonly known as:  LOPRESSOR Take 0.5 tablets (12.5 mg total) by mouth 2 (two) times daily.   oxyCODONE 5 MG immediate release tablet Commonly known as:  Oxy IR/ROXICODONE Take 1 tablet (5 mg total) by mouth every 6 (six) hours as needed for severe pain.   vitamin B-12 1000 MCG tablet Commonly known as:  CYANOCOBALAMIN Take 1,000 mcg by mouth daily.      Follow-up Information    Kerin Perna, MD Follow up.   Specialty:  Cardiothoracic Surgery Why:  Appointment with the surgeon on 09/21/2017 at 4:30 PM.  Please obtain a chest x-ray at [facility] imaging at 4 PM.  [facility] imaging is located in the same office complex on the first floor. Contact information: [address] [place] [address] [phone]        Runell Gess, MD Follow up.   Specialties:  Cardiology, Radiology Why:  Cardiology hospital follow up on June 7th at 8:00. Please arrive 15 minutes early for check in.  Contact information: [address] [place] [address] [phone]        Kirby Funk, MD. Call in 1 day(s).   Specialty:  Internal Medicine Contact information: [address], [address] [place] [address] [phone]        [facility] - Dme Follow up.   Why:  oxygen to be delivered to room prior to DC Contact information: [address] [place] [address] [phone]        [facility] .   Specialty:  Home Health Services Contact information: [address] [place] [address] [phone]          The patient has been discharged on:   1.Beta Blocker:  Yes [ y  ]                              No   [   ]                              If No, reason:  2.Ace Inhibitor/ARB: Yes [   ]                                      No  [ n   ]                                     If No, reason:BP well controlled  3.Statin:   Yes [ y  ]                  No  [   ]                  If No, reason:  4.Ecasa:  Yes  [ y  ]                  No   [   ]                  If No, reason:  Signed: Rowe Clack 08/27/2017, 10:19 AM

## 2017-08-20 ENCOUNTER — Inpatient Hospital Stay (HOSPITAL_COMMUNITY): Payer: PPO

## 2017-08-20 LAB — COMPREHENSIVE METABOLIC PANEL WITH GFR
ALT: 22 U/L (ref 17–63)
AST: 25 U/L (ref 15–41)
Albumin: 2.8 g/dL — ABNORMAL LOW (ref 3.5–5.0)
Alkaline Phosphatase: 31 U/L — ABNORMAL LOW (ref 38–126)
Anion gap: 5 (ref 5–15)
BUN: 17 mg/dL (ref 6–20)
CO2: 31 mmol/L (ref 22–32)
Calcium: 8.3 mg/dL — ABNORMAL LOW (ref 8.9–10.3)
Chloride: 104 mmol/L (ref 101–111)
Creatinine, Ser: 0.77 mg/dL (ref 0.61–1.24)
GFR calc Af Amer: >60 mL/min
GFR calc non Af Amer: >60 mL/min
Glucose, Bld: 127 mg/dL — ABNORMAL HIGH (ref 65–99)
Potassium: 4.4 mmol/L (ref 3.5–5.1)
Sodium: 140 mmol/L (ref 135–145)
Total Bilirubin: 0.6 mg/dL (ref 0.3–1.2)
Total Protein: 5.2 g/dL — ABNORMAL LOW (ref 6.5–8.1)

## 2017-08-20 LAB — POCT I-STAT, CHEM 8
BUN: 18 mg/dL (ref 6–20)
CHLORIDE: 99 mmol/L — AB (ref 101–111)
Calcium, Ion: 1.27 mmol/L (ref 1.15–1.40)
Creatinine, Ser: 0.7 mg/dL (ref 0.61–1.24)
GLUCOSE: 125 mg/dL — AB (ref 65–99)
HEMATOCRIT: 24 % — AB (ref 39.0–52.0)
Hemoglobin: 8.2 g/dL — ABNORMAL LOW (ref 13.0–17.0)
POTASSIUM: 4.4 mmol/L (ref 3.5–5.1)
SODIUM: 140 mmol/L (ref 135–145)
TCO2: 30 mmol/L (ref 22–32)

## 2017-08-20 LAB — BPAM RBC
Blood Product Expiration Date: 201906062359
Blood Product Expiration Date: 201906062359
Blood Product Expiration Date: 201906062359
Blood Product Expiration Date: 201906062359
Unit Type and Rh: 6200
Unit Type and Rh: 6200
Unit Type and Rh: 6200
Unit Type and Rh: 6200

## 2017-08-20 LAB — COOXEMETRY PANEL
Carboxyhemoglobin: 0.7 % (ref 0.5–1.5)
Methemoglobin: 1.7 % — ABNORMAL HIGH (ref 0.0–1.5)
O2 Saturation: 74.6 %
Total hemoglobin: 9.1 g/dL — ABNORMAL LOW (ref 12.0–16.0)

## 2017-08-20 LAB — TYPE AND SCREEN
ABO/RH(D): A POS
Antibody Screen: NEGATIVE
Unit division: 0
Unit division: 0
Unit division: 0
Unit division: 0

## 2017-08-20 LAB — GLUCOSE, CAPILLARY
GLUCOSE-CAPILLARY: 115 mg/dL — AB (ref 65–99)
GLUCOSE-CAPILLARY: 70 mg/dL (ref 65–99)
GLUCOSE-CAPILLARY: 131 mg/dL — AB (ref 65–99)
Glucose-Capillary: 111 mg/dL — ABNORMAL HIGH (ref 65–99)
Glucose-Capillary: 135 mg/dL — ABNORMAL HIGH (ref 65–99)

## 2017-08-20 LAB — CBC
HCT: 27.4 % — ABNORMAL LOW (ref 39.0–52.0)
Hemoglobin: 8.9 g/dL — ABNORMAL LOW (ref 13.0–17.0)
MCH: 33.1 pg (ref 26.0–34.0)
MCHC: 32.5 g/dL (ref 30.0–36.0)
MCV: 101.9 fL — ABNORMAL HIGH (ref 78.0–100.0)
Platelets: 68 10*3/uL — ABNORMAL LOW (ref 150–400)
RBC: 2.69 MIL/uL — ABNORMAL LOW (ref 4.22–5.81)
RDW: 14.1 % (ref 11.5–15.5)
WBC: 13.6 10*3/uL — ABNORMAL HIGH (ref 4.0–10.5)

## 2017-08-20 MED ORDER — LEVALBUTEROL HCL 1.25 MG/0.5ML IN NEBU
1.2500 mg | INHALATION_SOLUTION | Freq: Two times a day (BID) | RESPIRATORY_TRACT | Status: DC
  Administered 2017-08-20: 1.25 mg via RESPIRATORY_TRACT
  Filled 2017-08-20: qty 0.5

## 2017-08-20 MED ORDER — LEVOTHYROXINE SODIUM 75 MCG PO TABS
175.0000 ug | ORAL_TABLET | Freq: Every day | ORAL | Status: DC
  Administered 2017-08-21 – 2017-08-24 (×4): 175 ug via ORAL
  Filled 2017-08-20 (×4): qty 1

## 2017-08-20 MED ORDER — INSULIN ASPART 100 UNIT/ML ~~LOC~~ SOLN: 0.0000 [IU] | Freq: Three times a day (TID) | SUBCUTANEOUS | Status: DC

## 2017-08-20 NOTE — Progress Notes (Signed)
CBG before lunch was 70. One orange juice given and then patient drank a milk. He ended up eating about 50% of his lunch tray. Will continue to monitor closely.

## 2017-08-20 NOTE — Progress Notes (Signed)
3 Days Post-Op Procedure(s) (LRB): CORONARY ARTERY BYPASS GRAFTING (CABG) x 4 , USING LEFT INTERNAL MAMMARY ARTERY AND GREATER SAPHENOUS VEIN HARVESTED ENDOSCOPICALLY (N/A) AORTIC VALVE REPLACEMENT (AVR) WITH INSPIRIS AORTIC VALVE (N/A) TRANSESOPHAGEAL ECHOCARDIOGRAM (TEE) (N/A) Subjective: No complaints  Objective: Vital signs in last 24 hours: Temp:  [97.6 F (36.4 C)-98.3 F (36.8 C)] 98.3 F (36.8 C) (05/25 0821) Pulse Rate:  [81-113] 101 (05/25 0841) Cardiac Rhythm: Normal sinus rhythm (05/25 0800) Resp:  [11-23] 18 (05/25 0841) BP: (100-130)/(59-84) 100/79 (05/25 0800) SpO2:  [94 %-100 %] 96 % (05/25 0841) Weight:  [105.9 kg (233 lb 7.5 oz)] 105.9 kg (233 lb 7.5 oz) (05/25 0500)  Hemodynamic parameters for last 24 hours:    Intake/Output from previous day: 05/24 0701 - 05/25 0700 In: 1495 [P.O.:1080; I.V.:215; IV Piggyback:200] Out: 2185 [Urine:2185] Intake/Output this shift: Total I/O In: 1228 [P.O.:330; I.V.:898] Out: 325 [Urine:325]  General appearance: alert and cooperative Neurologic: intact Heart: regular rate and rhythm, S1, S2 normal, no murmur, click, rub or gallop Lungs: clear to auscultation bilaterally Extremities: extremities normal, atraumatic, no cyanosis or edema Wound: dressing dry  Lab Results: Recent Labs    08/19/17 0404  08/20/17 0404 08/20/17 0406  WBC 13.7*  --  13.6*  --   HGB 9.5*   < > 8.9* 8.2*  HCT 28.8*   < > 27.4* 24.0*  PLT 59*  --  68*  --    < > = values in this interval not displayed.   BMET:  Recent Labs    08/19/17 0404  08/20/17 0404 08/20/17 0406  NA 139   < > 140 140  K 4.1   < > 4.4 4.4  CL 104   < > 104 99*  CO2 29  --  31  --   GLUCOSE 167*   < > 127* 125*  BUN 8   < > 17 18  CREATININE 0.75   < > 0.77 0.70  CALCIUM 8.3*  --  8.3*  --    < > = values in this interval not displayed.    PT/INR: No results for input(s): LABPROT, INR in the last 72 hours. ABG    Component Value Date/Time   PHART  7.338 (L) 08/19/2017 0410   HCO3 29.1 (H) 08/19/2017 0410   TCO2 30 08/20/2017 0406   ACIDBASEDEF 3.0 (H) 08/18/2017 1641   O2SAT 74.6 08/20/2017 0400   CBG (last 3)  Recent Labs    08/19/17 2355 08/20/17 0431 08/20/17 0746  GLUCAP 135* 115* 111*   CXR: mild bibasilar atelectasis  Assessment/Plan: S/P Procedure(s) (LRB): CORONARY ARTERY BYPASS GRAFTING (CABG) x 4 , USING LEFT INTERNAL MAMMARY ARTERY AND GREATER SAPHENOUS VEIN HARVESTED ENDOSCOPICALLY (N/A) AORTIC VALVE REPLACEMENT (AVR) WITH INSPIRIS AORTIC VALVE (N/A) TRANSESOPHAGEAL ECHOCARDIOGRAM (TEE) (N/A)  POD 3 Hemodynamically stable in sinus rhythm. Co-ox is 75. DC milrinone and then wean off dopamine. Preop EF 40%.  Severe COPD: oxygen requirement improving. Continue Xopenex, IS, ambulation.   DC foley  Weight is at baseline. Will dc lasix.   LOS: 5 days    Alleen Borne 08/20/2017

## 2017-08-20 NOTE — Evaluation (Signed)
Physical Therapy Evaluation Patient Details Name: Jordan Hebert MRN: [id number] DOB: [birth year] Today's Date: 08/20/2017   History of Present Illness  77 y.o. s/p CABG 08/17/17. PMH includes: CAD, Lumbar stenosis  Clinical Impression  Patient is s/p above surgery resulting in functional limitations due to the deficits listed below (see PT Problem List). PTA, pt living with wife, independent with all mobility. Upon eval pt presents with minimal pain, min A for bed mobility/transfers and ambulating the unit with eva walker and min guard. SpO2 > 95% on RA this visit. Wife and patient would like to go home when medically ready, feel he will be able to progress to HHPT.   Patient will benefit from skilled PT to increase their independence and safety with mobility to allow discharge to the venue listed below.       Follow Up Recommendations Home health PT;Supervision for mobility/OOB    Equipment Recommendations  (TBD)    Recommendations for Other Services OT consult     Precautions / Restrictions Precautions Precautions: Fall;Sternal Precaution Comments: reviewed sternal precautions with pt and wife Restrictions Weight Bearing Restrictions: Yes      Mobility  Bed Mobility Overal bed mobility: Needs Assistance Bed Mobility: Sit to Supine;Supine to Sit     Supine to sit: Min assist Sit to supine: Min assist   General bed mobility comments: Min A to support legs over EOB at this time  Transfers Overall transfer level: Needs assistance Equipment used: None(Eva-Walker) Transfers: Sit to/from Stand Sit to Stand: Min assist         General transfer comment: cues for sternal precautions. pt able to stand with min A  Ambulation/Gait Ambulation/Gait assistance: Min guard Ambulation Distance (Feet): 500 Feet Assistive device: Rolling walker (2 wheeled) Gait Pattern/deviations: Step-to pattern;Step-through pattern Gait velocity: decreased   General Gait Details: patient  ambulated unit with Fara Boros with no rest breaks, SpO2 96% on 1L. min guard and chair follow for safety  Stairs            Wheelchair Mobility    Modified Rankin (Stroke Patients Only)       Balance Overall balance assessment: Mild deficits observed, not formally tested                                           Pertinent Vitals/Pain Pain Assessment: No/denies pain    Home Living Family/patient expects to be discharged to:: Private residence Living Arrangements: Spouse/significant other;Children Available Help at Discharge: Available 24 hours/day;Family Type of Home: House Home Access: Stairs to enter Entrance Stairs-Rails: Can reach both Entrance Stairs-Number of Steps: 3 Home Layout: One level;Two level;Able to live on main level with bedroom/bathroom Home Equipment: Dan Humphreys - 2 wheels;Bedside commode;Grab bars - tub/shower;Grab bars - toilet;Tub bench;Shower seat      Prior Function Level of Independence: Independent         Comments: driving      Hand Dominance        Extremity/Trunk Assessment                Communication   Communication: No difficulties  Cognition Arousal/Alertness: Awake/alert Behavior During Therapy: WFL for tasks assessed/performed Overall Cognitive Status: Within Functional Limits for tasks assessed  General Comments      Exercises     Assessment/Plan    PT Assessment    PT Problem List         PT Treatment Interventions      PT Goals (Current goals can be found in the Care Plan section)       Frequency Min 3X/week   Barriers to discharge        Co-evaluation               AM-PAC PT "6 Clicks" Daily Activity  Outcome Measure Difficulty turning over in bed (including adjusting bedclothes, sheets and blankets)?: Unable Difficulty moving from lying on back to sitting on the side of the bed? : Unable Difficulty sitting  down on and standing up from a chair with arms (e.g., wheelchair, bedside commode, etc,.)?: Unable Help needed moving to and from a bed to chair (including a wheelchair)?: A Little Help needed walking in hospital room?: A Little Help needed climbing 3-5 steps with a railing? : A Lot 6 Click Score: 11    End of Session Equipment Utilized During Treatment: Gait belt;Oxygen Activity Tolerance: Patient tolerated treatment well Patient left: in bed;with call bell/phone within reach;with family/visitor present Nurse Communication: Mobility status PT Visit Diagnosis: Unsteadiness on feet (R26.81)    Time: 1510-1540 PT Time Calculation (min) (ACUTE ONLY): 30 min   Charges:   PT Evaluation $PT Eval Low Complexity: 1 Low PT Treatments $Gait Training: 8-22 mins   PT G Codes:        Etta Grandchild, PT, DPT Acute Rehab Services Pager: [phone]   Etta Grandchild 08/20/2017, 4:54 PM

## 2017-08-20 NOTE — Progress Notes (Signed)
Patient ID: Jordan Hebert, male   DOB: 05/31/1940, 77 y.o.   MRN: [id number] TCTS Evening Rounds:  Hemodynamically stable in sinus rhythm Dopamine and Milrinone weaned off sats 98% on 2L  Urine output ok

## 2017-08-21 ENCOUNTER — Other Ambulatory Visit: Payer: Self-pay

## 2017-08-21 LAB — POCT I-STAT, CHEM 8
BUN: 22 mg/dL — ABNORMAL HIGH (ref 6–20)
Calcium, Ion: 1.24 mmol/L (ref 1.15–1.40)
Chloride: 99 mmol/L — ABNORMAL LOW (ref 101–111)
Creatinine, Ser: 0.7 mg/dL (ref 0.61–1.24)
Glucose, Bld: 125 mg/dL — ABNORMAL HIGH (ref 65–99)
HEMATOCRIT: 25 % — AB (ref 39.0–52.0)
HEMOGLOBIN: 8.5 g/dL — AB (ref 13.0–17.0)
POTASSIUM: 3.8 mmol/L (ref 3.5–5.1)
Sodium: 141 mmol/L (ref 135–145)
TCO2: 30 mmol/L (ref 22–32)

## 2017-08-21 LAB — CBC
HCT: 27.2 % — ABNORMAL LOW (ref 39.0–52.0)
Hemoglobin: 8.6 g/dL — ABNORMAL LOW (ref 13.0–17.0)
MCH: 32.3 pg (ref 26.0–34.0)
MCHC: 31.6 g/dL (ref 30.0–36.0)
MCV: 102.3 fL — ABNORMAL HIGH (ref 78.0–100.0)
Platelets: 81 10*3/uL — ABNORMAL LOW (ref 150–400)
RBC: 2.66 MIL/uL — ABNORMAL LOW (ref 4.22–5.81)
RDW: 14.1 % (ref 11.5–15.5)
WBC: 9.4 10*3/uL (ref 4.0–10.5)

## 2017-08-21 LAB — GLUCOSE, CAPILLARY
GLUCOSE-CAPILLARY: 116 mg/dL — AB (ref 65–99)
GLUCOSE-CAPILLARY: 121 mg/dL — AB (ref 65–99)
Glucose-Capillary: 99 mg/dL (ref 65–99)

## 2017-08-21 LAB — COMPREHENSIVE METABOLIC PANEL
ALT: 23 U/L (ref 17–63)
AST: 21 U/L (ref 15–41)
Albumin: 2.8 g/dL — ABNORMAL LOW (ref 3.5–5.0)
Alkaline Phosphatase: 36 U/L — ABNORMAL LOW (ref 38–126)
Anion gap: 4 — ABNORMAL LOW (ref 5–15)
BUN: 20 mg/dL (ref 6–20)
CO2: 32 mmol/L (ref 22–32)
Calcium: 8.3 mg/dL — ABNORMAL LOW (ref 8.9–10.3)
Chloride: 105 mmol/L (ref 101–111)
Creatinine, Ser: 0.83 mg/dL (ref 0.61–1.24)
GFR calc Af Amer: >60 mL/min (ref >60)
GFR calc non Af Amer: >60 mL/min (ref >60)
Glucose, Bld: 135 mg/dL — ABNORMAL HIGH (ref 65–99)
Potassium: 3.8 mmol/L (ref 3.5–5.1)
Sodium: 141 mmol/L (ref 135–145)
Total Bilirubin: 0.7 mg/dL (ref 0.3–1.2)
Total Protein: 5.5 g/dL — ABNORMAL LOW (ref 6.5–8.1)

## 2017-08-21 LAB — COOXEMETRY PANEL
Carboxyhemoglobin: 1.1 % (ref 0.5–1.5)
Methemoglobin: 1.7 % — ABNORMAL HIGH (ref 0.0–1.5)
O2 Saturation: 58.9 %
Total hemoglobin: 8.6 g/dL — ABNORMAL LOW (ref 12.0–16.0)

## 2017-08-21 MED ORDER — ASPIRIN EC 325 MG PO TBEC: 325.0000 mg | DELAYED_RELEASE_TABLET | Freq: Every day | ORAL | Status: DC

## 2017-08-21 MED ORDER — TRAMADOL HCL 50 MG PO TABS: 50.0000 mg | ORAL_TABLET | Freq: Four times a day (QID) | ORAL | Status: DC | PRN

## 2017-08-21 MED ORDER — SODIUM CHLORIDE 0.9% FLUSH: 3.0000 mL | INTRAVENOUS | Status: DC | PRN

## 2017-08-21 MED ORDER — SODIUM CHLORIDE 0.9% FLUSH
3.0000 mL | Freq: Two times a day (BID) | INTRAVENOUS | Status: DC
  Administered 2017-08-21 – 2017-08-22 (×2): 3 mL via INTRAVENOUS
  Administered 2017-08-23: 6 mL via INTRAVENOUS

## 2017-08-21 MED ORDER — OXYCODONE HCL 5 MG PO TABS
5.0000 mg | ORAL_TABLET | ORAL | Status: DC | PRN
  Administered 2017-08-21: 5 mg via ORAL
  Administered 2017-08-22 – 2017-08-23 (×2): 10 mg via ORAL
  Filled 2017-08-21: qty 1
  Filled 2017-08-21 (×2): qty 2

## 2017-08-21 MED ORDER — ACETAMINOPHEN 325 MG PO TABS
650.0000 mg | ORAL_TABLET | Freq: Four times a day (QID) | ORAL | Status: DC | PRN
  Administered 2017-08-21: 650 mg via ORAL
  Filled 2017-08-21: qty 2

## 2017-08-21 MED ORDER — ONDANSETRON HCL 4 MG/2ML IJ SOLN: 4.0000 mg | Freq: Four times a day (QID) | INTRAMUSCULAR | Status: DC | PRN

## 2017-08-21 MED ORDER — ASPIRIN EC 325 MG PO TBEC
325.0000 mg | DELAYED_RELEASE_TABLET | Freq: Every day | ORAL | Status: DC
  Administered 2017-08-22 – 2017-08-24 (×3): 325 mg via ORAL
  Filled 2017-08-21 (×3): qty 1

## 2017-08-21 MED ORDER — PANTOPRAZOLE SODIUM 40 MG PO TBEC
40.0000 mg | DELAYED_RELEASE_TABLET | Freq: Every day | ORAL | Status: DC
  Administered 2017-08-22 – 2017-08-24 (×3): 40 mg via ORAL
  Filled 2017-08-21 (×3): qty 1

## 2017-08-21 MED ORDER — METOPROLOL TARTRATE 12.5 MG HALF TABLET
12.5000 mg | ORAL_TABLET | Freq: Two times a day (BID) | ORAL | Status: DC
  Administered 2017-08-21 – 2017-08-24 (×6): 12.5 mg via ORAL
  Filled 2017-08-21 (×6): qty 1

## 2017-08-21 MED ORDER — MOVING RIGHT ALONG BOOK
Freq: Once | Status: AC
  Administered 2017-08-21: 13:00:00
  Filled 2017-08-21: qty 1

## 2017-08-21 MED ORDER — SODIUM CHLORIDE 0.9 % IV SOLN: 250.0000 mL | INTRAVENOUS | Status: DC | PRN

## 2017-08-21 MED ORDER — ONDANSETRON HCL 4 MG PO TABS: 4.0000 mg | ORAL_TABLET | Freq: Four times a day (QID) | ORAL | Status: DC | PRN

## 2017-08-21 NOTE — Progress Notes (Signed)
4 Days Post-Op Procedure(s) (LRB): CORONARY ARTERY BYPASS GRAFTING (CABG) x 4 , USING LEFT INTERNAL MAMMARY ARTERY AND GREATER SAPHENOUS VEIN HARVESTED ENDOSCOPICALLY (N/A) AORTIC VALVE REPLACEMENT (AVR) WITH INSPIRIS AORTIC VALVE (N/A) TRANSESOPHAGEAL ECHOCARDIOGRAM (TEE) (N/A) Subjective: No complaints Bowels working  Objective: Vital signs in last 24 hours: Temp:  [97.8 F (36.6 C)-98.7 F (37.1 C)] 98.2 F (36.8 C) (05/26 0800) Pulse Rate:  [76-103] 103 (05/26 0918) Cardiac Rhythm: Normal sinus rhythm (05/26 0800) Resp:  [14-26] 15 (05/26 0918) BP: (94-133)/(45-94) 129/81 (05/26 0918) SpO2:  [87 %-100 %] 96 % (05/26 0918) Weight:  [101.5 kg (223 lb 12.3 oz)] 101.5 kg (223 lb 12.3 oz) (05/26 0800)  Hemodynamic parameters for last 24 hours:    Intake/Output from previous day: 05/25 0701 - 05/26 0700 In: 2374.4 [P.O.:810; I.V.:1364.4; IV Piggyback:200] Out: 1300 [Urine:1300] Intake/Output this shift: Total I/O In: -  Out: 115 [Urine:115]  General appearance: alert and cooperative Heart: regular rate and rhythm, S1, S2 normal, no murmur, click, rub or gallop Lungs: clear to auscultation bilaterally Extremities: extremities normal, atraumatic, no cyanosis or edema Wound: incisions ok  Lab Results: Recent Labs    08/20/17 0404  08/21/17 0456 08/21/17 0505  WBC 13.6*  --  9.4  --   HGB 8.9*   < > 8.6* 8.5*  HCT 27.4*   < > 27.2* 25.0*  PLT 68*  --  81*  --    < > = values in this interval not displayed.   BMET:  Recent Labs    08/20/17 0404  08/21/17 0456 08/21/17 0505  NA 140   < > 141 141  K 4.4   < > 3.8 3.8  CL 104   < > 105 99*  CO2 31  --  32  --   GLUCOSE 127*   < > 135* 125*  BUN 17   < > 20 22*  CREATININE 0.77   < > 0.83 0.70  CALCIUM 8.3*  --  8.3*  --    < > = values in this interval not displayed.    PT/INR: No results for input(s): LABPROT, INR in the last 72 hours. ABG    Component Value Date/Time   PHART 7.338 (L) 08/19/2017  0410   HCO3 29.1 (H) 08/19/2017 0410   TCO2 30 08/21/2017 0505   ACIDBASEDEF 3.0 (H) 08/18/2017 1641   O2SAT 58.9 08/21/2017 0512   CBG (last 3)  Recent Labs    08/20/17 1248 08/20/17 1637 08/21/17 0637  GLUCAP 70 131* 116*    Assessment/Plan: S/P Procedure(s) (LRB): CORONARY ARTERY BYPASS GRAFTING (CABG) x 4 , USING LEFT INTERNAL MAMMARY ARTERY AND GREATER SAPHENOUS VEIN HARVESTED ENDOSCOPICALLY (N/A) AORTIC VALVE REPLACEMENT (AVR) WITH INSPIRIS AORTIC VALVE (N/A) TRANSESOPHAGEAL ECHOCARDIOGRAM (TEE) (N/A)  POD 4 Hemodynamically stable in sinus rhythm. Co-ox is 59% off inotropes. Sats 95%. Preop EF 40.  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   CBG's ok and preop Hgb A1c was 5.5 so will stop Levemir and SSI.  Transfer to 4E and continue mobilization and IS.   LOS: 6 days    Jordan Hebert 08/21/2017

## 2017-08-22 LAB — BASIC METABOLIC PANEL
Anion gap: 8 (ref 5–15)
BUN: 15 mg/dL (ref 6–20)
CO2: 30 mmol/L (ref 22–32)
Calcium: 8.8 mg/dL — ABNORMAL LOW (ref 8.9–10.3)
Chloride: 103 mmol/L (ref 101–111)
Creatinine, Ser: 0.66 mg/dL (ref 0.61–1.24)
GFR calc Af Amer: >60 mL/min (ref >60)
GFR calc non Af Amer: >60 mL/min (ref >60)
Glucose, Bld: 117 mg/dL — ABNORMAL HIGH (ref 65–99)
Potassium: 4.2 mmol/L (ref 3.5–5.1)
Sodium: 141 mmol/L (ref 135–145)

## 2017-08-22 LAB — CBC
HCT: 31.1 % — ABNORMAL LOW (ref 39.0–52.0)
Hemoglobin: 9.9 g/dL — ABNORMAL LOW (ref 13.0–17.0)
MCH: 32.4 pg (ref 26.0–34.0)
MCHC: 31.8 g/dL (ref 30.0–36.0)
MCV: 101.6 fL — ABNORMAL HIGH (ref 78.0–100.0)
Platelets: 114 10*3/uL — ABNORMAL LOW (ref 150–400)
RBC: 3.06 MIL/uL — ABNORMAL LOW (ref 4.22–5.81)
RDW: 14 % (ref 11.5–15.5)
WBC: 10 10*3/uL (ref 4.0–10.5)

## 2017-08-22 MED ORDER — HYDROCOD POLST-CPM POLST ER 10-8 MG/5ML PO SUER
5.0000 mL | Freq: Two times a day (BID) | ORAL | Status: DC | PRN
  Administered 2017-08-22 – 2017-08-24 (×3): 5 mL via ORAL
  Filled 2017-08-22 (×3): qty 5

## 2017-08-22 MED ORDER — CLOPIDOGREL BISULFATE 75 MG PO TABS
75.0000 mg | ORAL_TABLET | Freq: Every day | ORAL | Status: DC
  Administered 2017-08-22 – 2017-08-24 (×3): 75 mg via ORAL
  Filled 2017-08-22 (×3): qty 1

## 2017-08-22 NOTE — Progress Notes (Signed)
Physical Therapy Treatment Patient Details Name: Jordan Hebert MRN: [id number] DOB: [birth year] Today's Date: 08/22/2017    History of Present Illness 77 y.o. admitted with NSTEMI s/p CABGx 4 & AVR 08/17/17. PMH includes: CAD, Lumbar stenosis, HLD, hypothyroidism, and depression.    PT Comments    Pt progressing with all mobility able to perform gait and stairs today but needed reinforcement of precautions with handout provided. Pt with assist for bed mobility as greatest limiting factor at this point. Encouraged continued mobility with nursing and pt reports fatigue after session with return to bed.   HR 99-122 BP 130/74 pre, 131/71 post 95-100% spO2    Follow Up Recommendations  Home health PT;Supervision for mobility/OOB     Equipment Recommendations  Rolling walker with 5" wheels;3in1 (PT)    Recommendations for Other Services       Precautions / Restrictions Precautions Precautions: Fall;Sternal Precaution Booklet Issued: Yes (comment) Precaution Comments: reviewed sternal precautions and provided handout. Pt able to recall move in tube and no pulling    Mobility  Bed Mobility   Bed Mobility: Sit to Supine           General bed mobility comments: in chair on arrival. returning to bed mod assist to elevate legs and control trunk to surface as well as achieve midline in bed  Transfers       Sit to Stand: Min guard         General transfer comment: pt able to stand without use of hands, cues for sequence to sit  Ambulation/Gait Ambulation/Gait assistance: Min guard Ambulation Distance (Feet): 550 Feet Assistive device: Rolling walker (2 wheeled) Gait Pattern/deviations: Step-through pattern;Decreased stride length   Gait velocity interpretation: 1.31 - 2.62 ft/sec, indicative of limited community ambulator General Gait Details: cues for posture with SpO2 95% on RA   Stairs Stairs: Yes Stairs assistance: Supervision Stair Management: Step to  pattern;Sideways Number of Stairs: 10 General stair comments: pt ascended with left rail and descended with right rail with cues for keeping elbows in.    Wheelchair Mobility    Modified Rankin (Stroke Patients Only)       Balance Overall balance assessment: Mild deficits observed, not formally tested                                          Cognition Arousal/Alertness: Awake/alert Behavior During Therapy: WFL for tasks assessed/performed Overall Cognitive Status: Impaired/Different from baseline Area of Impairment: Memory                     Memory: Decreased recall of precautions                Exercises      General Comments        Pertinent Vitals/Pain Pain Assessment: No/denies pain    Home Living                      Prior Function            PT Goals (current goals can now be found in the care plan section) Progress towards PT goals: Progressing toward goals    Frequency           PT Plan Current plan remains appropriate    Co-evaluation              AM-PAC PT "  6 Clicks" Daily Activity  Outcome Measure  Difficulty turning over in bed (including adjusting bedclothes, sheets and blankets)?: Unable Difficulty moving from lying on back to sitting on the side of the bed? : Unable Difficulty sitting down on and standing up from a chair with arms (e.g., wheelchair, bedside commode, etc,.)?: A Little Help needed moving to and from a bed to chair (including a wheelchair)?: A Little Help needed walking in hospital room?: A Little Help needed climbing 3-5 steps with a railing? : A Little 6 Click Score: 14    End of Session Equipment Utilized During Treatment: Gait belt Activity Tolerance: Patient tolerated treatment well Patient left: in bed;with call bell/phone within reach Nurse Communication: Mobility status PT Visit Diagnosis: Muscle weakness (generalized) (M62.81);Other abnormalities of gait and  mobility (R26.89)     Time: 0454-0981 PT Time Calculation (min) (ACUTE ONLY): 33 min  Charges:  $Gait Training: 8-22 mins $Therapeutic Activity: 8-22 mins                    G Codes:       Jordan Hebert, PT [phone]    Jordan Hebert B Diego Ulbricht 08/22/2017, 8:46 AM

## 2017-08-22 NOTE — Progress Notes (Signed)
Enjoyed talking with Jordan Hebert about his life and the Advanced Directive.  He states that he and his wife will fill I tout this evening and we will work towards getting it notarized in the morning.  So grateful for the conversation with him.  Very pleasant man.    08/22/17 1158  Clinical Encounter Type  Visited With Patient  Visit Type Initial;Spiritual support  Spiritual Encounters  Spiritual Needs Literature

## 2017-08-22 NOTE — Progress Notes (Addendum)
Progress Note  Patient Name: Jordan Hebert Date of Encounter: 08/22/2017  Primary Cardiologist: Nanetta Batty, MD   Subjective   Mild chest wall soreness at sternotomy.  Otherwise no chest pain or shortness of breath.  Ambulating with assistance.  Inpatient Medications    Scheduled Meds: . aspirin EC  325 mg Oral Daily  . atorvastatin  80 mg Oral q1800  . Chlorhexidine Gluconate Cloth  6 each Topical Daily  . clonazePAM  1 mg Oral QHS  . guaiFENesin  600 mg Oral BID  . levothyroxine  175 mcg Oral QAC breakfast  . metoprolol tartrate  12.5 mg Oral BID  . mometasone-formoterol  2 puff Inhalation BID  . pantoprazole  40 mg Oral QAC breakfast  . sodium chloride flush  10-40 mL Intracatheter Q12H  . sodium chloride flush  3 mL Intravenous Q12H   Continuous Infusions: . sodium chloride     PRN Meds: sodium chloride, acetaminophen, levalbuterol, ondansetron **OR** ondansetron (ZOFRAN) IV, oxyCODONE, sodium chloride flush, sodium chloride flush, traMADol   Vital Signs    Vitals:   08/22/17 0200 08/22/17 0300 08/22/17 0400 08/22/17 0500  BP: 128/85 (!) 130/115 139/83 135/90  Pulse: 86 89 95 90  Resp: (!) 23 16 (!) 23 (!) 32  Temp:      TempSrc:   Oral   SpO2: 100% 99% 97% 97%  Weight:    229 lb 15 oz (104.3 kg)  Height:        Intake/Output Summary (Last 24 hours) at 08/22/2017 1610 Last data filed at 08/22/2017 0600 Gross per 24 hour  Intake 630 ml  Output 1115 ml  Net -485 ml   Filed Weights   08/20/17 0500 08/21/17 0800 08/22/17 0500  Weight: 233 lb 7.5 oz (105.9 kg) 223 lb 12.3 oz (101.5 kg) 229 lb 15 oz (104.3 kg)    Telemetry    Sinus tachycardia and artifact - Personally Reviewed  ECG    No new tracing.  Physical Exam   GEN: No acute distress.   Neck: Right sided central line in place. Cardiac: RRR, no murmurs, rubs, or gallops.  Respiratory: Diminished breath sounds at both bases. GI: Soft, nontender, non-distended  MS: 1+ pretibial edema  bilaterally; no deformity. Neuro:  Nonfocal  Psych: Normal affect   Labs    Chemistry Recent Labs  Lab 08/18/17 0436  08/20/17 0404  08/21/17 0456 08/21/17 0505 08/22/17 0545  NA 140   < > 140   < > 141 141 141  K 4.1   < > 4.4   < > 3.8 3.8 4.2  CL 109   < > 104   < > 105 99* 103  CO2 25   < > 31  --  32  --  30  GLUCOSE 131*   < > 127*   < > 135* 125* 117*  BUN 10   < > 17   < > 20 22* 15  CREATININE 0.91   < > 0.77   < > 0.83 0.70 0.66  CALCIUM 7.6*   < > 8.3*  --  8.3*  --  8.8*  PROT 5.0*  --  5.2*  --  5.5*  --   --   ALBUMIN 3.1*  --  2.8*  --  2.8*  --   --   AST 47*  --  25  --  21  --   --   ALT 17  --  22  --  23  --   --  ALKPHOS 31*  --  31*  --  36*  --   --   BILITOT 0.9  --  0.6  --  0.7  --   --   GFRNONAA >60   < > >60  --  >60  --  >60  GFRAA >60   < > >60  --  >60  --  >60  ANIONGAP 6   < > 5  --  4*  --  8   < > = values in this interval not displayed.     Hematology Recent Labs  Lab 08/20/17 0404  08/21/17 0456 08/21/17 0505 08/22/17 0545  WBC 13.6*  --  9.4  --  10.0  RBC 2.69*  --  2.66*  --  3.06*  HGB 8.9*   < > 8.6* 8.5* 9.9*  HCT 27.4*   < > 27.2* 25.0* 31.1*  MCV 101.9*  --  102.3*  --  101.6*  MCH 33.1  --  32.3  --  32.4  MCHC 32.5  --  31.6  --  31.8  RDW 14.1  --  14.1  --  14.0  PLT 68*  --  81*  --  114*   < > = values in this interval not displayed.    Cardiac Enzymes Recent Labs  Lab 08/16/17 0113 08/16/17 0642 08/16/17 2347  TROPONINI 0.27* 0.25* 0.24*    Recent Labs  Lab 08/15/17 1853  TROPIPOC 0.33*     BNPNo results for input(s): BNP, PROBNP in the last 168 hours.   DDimer No results for input(s): DDIMER in the last 168 hours.   Radiology    No results found.  Cardiac Studies   TTE (08/16/17): - Left ventricle: The cavity size was normal. Systolic function was   moderately reduced. The estimated ejection fraction was in the   range of 35% to 40%. Diffuse hypokinesis. Doppler parameters are    consistent with abnormal left ventricular relaxation (grade 1   diastolic dysfunction). - Aortic valve: Valve mobility was restricted. There was moderate   stenosis. Peak velocity (S): 360 cm/s. Mean gradient (S): 27 mm   Hg. Valve area (VTI): 0.34 cm^2. Valve area (Vmax): 0.39 cm^2.   Valve area (Vmean): 0.26 cm^2. - Mitral valve: Moderately calcified annulus. Mildly thickened   leaflets.  Coronary angiography (08/16/17):  Severe coronary obstructive disease with critical 95% left main stenosis; 30% stenosis of the LAD after a large septal perforating branch; total occlusion of the ostium of the left circumflex coronary artery; and large dominant RCA with calcification in 65 to 70% proximal stenosis.  There is extensive collateralization of the entire left circumflex vessel via the posterior lateral coronary artery.  Supravalvular aortography demonstrating markedly reduced aortic valve excursion and mild aortic root dilation suggestion of a difficult aortic valve stenosis.  Despite an arduous attempt at trying to cross the valve, the valve was never able to be crossed to document the aortic stenosis severity and LV function.  Distal aortography revealing somewhat tortuous aorta without definitive aneurysmal dilatation.  Patient Profile     77 y.o. male man with CAD and h/o remote PCI, HLD, rheumatic fever, hypothyroidism, lumbar spinal stenosis and depression, admitted with NTSEMI found to have 3-vessel CAD, ischemic cardiomyopathy (LVEF 35-40%), and at least moderate AS s/p CABG and bioprosthetic AVR (08/17/17).   Assessment & Plan    CAD/NSTEMI: No recurrent angina now s/p CABG on 5/22 (LIMA-diagonal, SVG-ramus, SVG-OM, and SVG-PDA).  Continue ASA and high-intensity statin.  Consider DAPT up to 12 months (if additional anticoagulation is not added prior to discharge), when felt to do so from a post-op standpoint given NSTEMI at time of presentation.  Outpatient cardiac rehab when  cleared by cardiac surgery.  Ischemic cardiomyopathy: Edema and diminished breath sounds noted on exam.  Pt weaned off milrinone on 5/25.  Continue metoprolol tartrate 12.5 mg BID; consider transitioning to carvedilol or metoprolol succinate prior to discharge to optimize evidence-based HF therapy.  BP somewhat labile over last 24 hours with nadir of 86/58; consider adding ACEI/ARB prior to discharge, as BP allows.  Consider gentle diuresis as needed to maintain slightly net negative fluid balance.  Aortic stenosis s/p bioprosthetic AVR:  Continue ASA; defer addition of warfarin to cardiac surgery.  Hyperlipidemia:  Continue atorvastatin 80 mg daily; goal LDL < 70 (60 on admission).  For questions or updates, please contact [facility] Please consult [email or web address] for contact info under Cardiology/STEMI.      Signed, Yvonne Kendall, MD  08/22/2017, 7:12 AM

## 2017-08-22 NOTE — Progress Notes (Addendum)
Patient arrived to 4E room 06 at this time. V/S done and assessment complete. Patient connected to telemetry. CCMD notified. Patient oriented to room and how to call the nurse with any needs. Will continue to monitor.   Ernestina Columbia, RN

## 2017-08-22 NOTE — Progress Notes (Signed)
5 Days Post-Op Procedure(s) (LRB): CORONARY ARTERY BYPASS GRAFTING (CABG) x 4 , USING LEFT INTERNAL MAMMARY ARTERY AND GREATER SAPHENOUS VEIN HARVESTED ENDOSCOPICALLY (N/A) AORTIC VALVE REPLACEMENT (AVR) WITH INSPIRIS AORTIC VALVE (N/A) TRANSESOPHAGEAL ECHOCARDIOGRAM (TEE) (N/A) Subjective:  Complains of chest wall pain from coughing and sneezing overnight. Coughing up some clear mucous.  Objective: Vital signs in last 24 hours: Temp:  [97.7 F (36.5 C)-98.2 F (36.8 C)] 97.9 F (36.6 C) (05/27 0000) Pulse Rate:  [81-102] 101 (05/27 1000) Cardiac Rhythm: Normal sinus rhythm (05/27 0800) Resp:  [14-32] 26 (05/27 1000) BP: (86-139)/(58-115) 126/80 (05/27 1000) SpO2:  [87 %-100 %] 99 % (05/27 1000) Weight:  [104.3 kg (229 lb 15 oz)] 104.3 kg (229 lb 15 oz) (05/27 0500)  Hemodynamic parameters for last 24 hours:    Intake/Output from previous day: 05/26 0701 - 05/27 0700 In: 630 [P.O.:630] Out: 1115 [Urine:1115] Intake/Output this shift: Total I/O In: -  Out: 150 [Urine:150]  General appearance: alert and cooperative Heart: regular rate and rhythm, S1, S2 normal, no murmur, click, rub or gallop Lungs: clear to auscultation bilaterally Extremities: extremities normal, atraumatic, no cyanosis or edema Wound: incisions ok  Lab Results: Recent Labs    08/21/17 0456 08/21/17 0505 08/22/17 0545  WBC 9.4  --  10.0  HGB 8.6* 8.5* 9.9*  HCT 27.2* 25.0* 31.1*  PLT 81*  --  114*   BMET:  Recent Labs    08/21/17 0456 08/21/17 0505 08/22/17 0545  NA 141 141 141  K 3.8 3.8 4.2  CL 105 99* 103  CO2 32  --  30  GLUCOSE 135* 125* 117*  BUN 20 22* 15  CREATININE 0.83 0.70 0.66  CALCIUM 8.3*  --  8.8*    PT/INR: No results for input(s): LABPROT, INR in the last 72 hours. ABG    Component Value Date/Time   PHART 7.338 (L) 08/19/2017 0410   HCO3 29.1 (H) 08/19/2017 0410   TCO2 30 08/21/2017 0505   ACIDBASEDEF 3.0 (H) 08/18/2017 1641   O2SAT 58.9 08/21/2017 0512    CBG (last 3)  Recent Labs    08/21/17 0637 08/21/17 1142 08/21/17 1641  GLUCAP 116* 121* 99    Assessment/Plan: S/P Procedure(s) (LRB): CORONARY ARTERY BYPASS GRAFTING (CABG) x 4 , USING LEFT INTERNAL MAMMARY ARTERY AND GREATER SAPHENOUS VEIN HARVESTED ENDOSCOPICALLY (N/A) AORTIC VALVE REPLACEMENT (AVR) WITH INSPIRIS AORTIC VALVE (N/A) TRANSESOPHAGEAL ECHOCARDIOGRAM (TEE) (N/A)  POD 5 Hemodynamically stable in sinus rhythm. sats 98% on 1L [place] Weight is below preop. Continue IS, ambulation Follow up 2V CXR in am. Awaiting bed on 4E.     LOS: 7 days    Alleen Borne 08/22/2017

## 2017-08-23 ENCOUNTER — Inpatient Hospital Stay (HOSPITAL_COMMUNITY): Payer: PPO

## 2017-08-23 LAB — GLUCOSE, CAPILLARY: GLUCOSE-CAPILLARY: 112 mg/dL — AB (ref 65–99)

## 2017-08-23 NOTE — Progress Notes (Signed)
Pacing wires removed at this time. Small amount of bleeding noted to right side. Pressure applied for a few minutes and bleeding stopped. Patient tolerated well. Patient will be monitored per protocol for an hour and patient aware of bedrest during that time.   Ernestina Columbia, RN

## 2017-08-23 NOTE — Progress Notes (Signed)
PT Cancellation Note  Patient Details Name: Jordan Hebert MRN: [id number] DOB: [birth year]   Cancelled Treatment:    Reason Eval/Treat Not Completed: Other (comment). Pt amb earlier with cardiac rehab. Currently pt reports he needs a nap and has had a very busy few hours.    Angelina Ok Maycok 08/23/2017, 2:06 PM [employer] PT [phone]

## 2017-08-23 NOTE — Progress Notes (Addendum)
      [address]       [place] [address]             [phone]      6 Days Post-Op Procedure(s) (LRB): CORONARY ARTERY BYPASS GRAFTING (CABG) x 4 , USING LEFT INTERNAL MAMMARY ARTERY AND GREATER SAPHENOUS VEIN HARVESTED ENDOSCOPICALLY (N/A) AORTIC VALVE REPLACEMENT (AVR) WITH INSPIRIS AORTIC VALVE (N/A) TRANSESOPHAGEAL ECHOCARDIOGRAM (TEE) (N/A) Subjective: Feels okay today. Sleepy.   Objective: Vital signs in last 24 hours: Temp:  [98.1 F (36.7 C)-98.5 F (36.9 C)] 98.1 F (36.7 C) (05/28 0358) Pulse Rate:  [87-103] 87 (05/28 0358) Cardiac Rhythm: Normal sinus rhythm (05/28 0700) Resp:  [15-28] 20 (05/28 0358) BP: (108-139)/(61-87) 134/87 (05/28 0358) SpO2:  [88 %-100 %] 95 % (05/28 0358) Weight:  [227 lb 15.3 oz (103.4 kg)] 227 lb 15.3 oz (103.4 kg) (05/28 0358)     Intake/Output from previous day: 05/27 0701 - 05/28 0700 In: 480 [P.O.:480] Out: 2825 [Urine:2825] Intake/Output this shift: No intake/output data recorded.  General appearance: alert, cooperative and no distress Heart: regular rate and rhythm, S1, S2 normal, no murmur, click, rub or gallop Lungs: clear to auscultation bilaterally Abdomen: soft, non-tender; bowel sounds normal; no masses,  no organomegaly Extremities: right lower leg 1+ pitting edema  Wound: clean and dry  Lab Results: Recent Labs    08/21/17 0456 08/21/17 0505 08/22/17 0545  WBC 9.4  --  10.0  HGB 8.6* 8.5* 9.9*  HCT 27.2* 25.0* 31.1*  PLT 81*  --  114*   BMET:  Recent Labs    08/21/17 0456 08/21/17 0505 08/22/17 0545  NA 141 141 141  K 3.8 3.8 4.2  CL 105 99* 103  CO2 32  --  30  GLUCOSE 135* 125* 117*  BUN 20 22* 15  CREATININE 0.83 0.70 0.66  CALCIUM 8.3*  --  8.8*    PT/INR: No results for input(s): LABPROT, INR in the last 72 hours. ABG    Component Value Date/Time   PHART 7.338 (L) 08/19/2017 0410   HCO3 29.1 (H) 08/19/2017 0410   TCO2 30 08/21/2017 0505   ACIDBASEDEF 3.0 (H)  08/18/2017 1641   O2SAT 58.9 08/21/2017 0512   CBG (last 3)  Recent Labs    08/21/17 0637 08/21/17 1142 08/21/17 1641  GLUCAP 116* 121* 99    Assessment/Plan: S/P Procedure(s) (LRB): CORONARY ARTERY BYPASS GRAFTING (CABG) x 4 , USING LEFT INTERNAL MAMMARY ARTERY AND GREATER SAPHENOUS VEIN HARVESTED ENDOSCOPICALLY (N/A) AORTIC VALVE REPLACEMENT (AVR) WITH INSPIRIS AORTIC VALVE (N/A) TRANSESOPHAGEAL ECHOCARDIOGRAM (TEE) (N/A)  1. CV-NSR in the 90s, BP well controlled. Tolerating ASA, statin, Plavix, and Lopressor 2. Pulm-tolerating 3L [place] with good oxygen saturation. Encouraged use of incentive spirometer 3. Renal-creatinine 0.66, electrolytes okay 4. H and H stable, platelets are 114 5. Endo-blood glucose level is well controlled  Plan: Await 2V chest xray. Discontinue central line. Discontinue epicardial pacing wires. Ambulate in the halls today. Work on weaning oxygen support.    LOS: 8 days    Sharlene Dory 08/23/2017  Continue to wean O2 as tolerated May need short term home O2 Plan HHN for restorative care, monitoring  patient examined and medical record reviewed,agree with above note. Kathlee Nations Trigt III 08/23/2017

## 2017-08-23 NOTE — Progress Notes (Signed)
CARDIAC REHAB PHASE I   PRE:  Rate/Rhythm: 99 SR    BP: sitting 141/83    SaO2: 91 RA, (87 RA asleep)  MODE:  Ambulation: 470 ft   POST:  Rate/Rhythm: 124 ST    BP: sitting 119/74     SaO2: 95 RA (but difficult to register)  Pt on RA on arrival, sleeping, SaO2 low. Increased to 91 RA while awake. Pt able to move himself to EOB with min assist. Stood independently and used RW. Steady in hall. HR increased and pt with moderate SOB, esp at end of walk. SaO2 difficult to register due to cold fingers but ultimately in mid 90s. To recliner, left on RA and encouraged to walk x2 more and use IS. Pt might need O2 while sleeping.  [id number]   Harriet Masson CES, ACSM 08/23/2017 9:55 AM

## 2017-08-23 NOTE — Care Management Note (Addendum)
Case Management Note  Patient Details  Name: Jordan Hebert MRN: [id number] Date of Birth: 12-12-1940  Subjective/Objective:                 S/p cabg from hoe w wife. Patient states he has no preference for [facility] agency. Verified w [facility] there is no copay. Referral accepted by [facility].    Action/Plan:  Will need HH PT orders at DC.  5/29 DC to home w PT RN through [facility]. Requested home oxygen, portable tank will be delivered to room prior to DC.    Expected Discharge Date:                  Expected Discharge Plan:  Home Health  In-House Referral:     Discharge planning Services  CM Consult  Post Acute Care Choice:    Choice offered to:     DME Arranged:   oxygen DME Agency:   [facility]  HH Arranged:   PT RN  HH Agency:   [facility]  Status of Service:  Complete  If discussed at [employer] of Stay Meetings, dates discussed:    Additional Comments:  Lawerance Sabal, RN 08/23/2017, 11:22 AM

## 2017-08-23 NOTE — Care Management Important Message (Signed)
Important Message  Patient Details  Name: Jordan Hebert MRN: [id number] Date of Birth: 12-29-1940   Medicare Important Message Given:  Yes    Keevin Panebianco P Nakyra Bourn 08/23/2017, 3:04 PM

## 2017-08-23 NOTE — Progress Notes (Signed)
Patient off the floor for xray.

## 2017-08-24 LAB — CBC
HCT: 28.9 % — ABNORMAL LOW (ref 39.0–52.0)
Hemoglobin: 9.4 g/dL — ABNORMAL LOW (ref 13.0–17.0)
MCH: 32 pg (ref 26.0–34.0)
MCHC: 32.5 g/dL (ref 30.0–36.0)
MCV: 98.3 fL (ref 78.0–100.0)
Platelets: 187 10*3/uL (ref 150–400)
RBC: 2.94 MIL/uL — ABNORMAL LOW (ref 4.22–5.81)
RDW: 13.6 % (ref 11.5–15.5)
WBC: 9.2 10*3/uL (ref 4.0–10.5)

## 2017-08-24 LAB — BASIC METABOLIC PANEL
Anion gap: 8 (ref 5–15)
BUN: 17 mg/dL (ref 6–20)
CO2: 29 mmol/L (ref 22–32)
Calcium: 8.7 mg/dL — ABNORMAL LOW (ref 8.9–10.3)
Chloride: 103 mmol/L (ref 101–111)
Creatinine, Ser: 0.68 mg/dL (ref 0.61–1.24)
GFR calc Af Amer: >60 mL/min (ref >60)
GFR calc non Af Amer: >60 mL/min (ref >60)
Glucose, Bld: 115 mg/dL — ABNORMAL HIGH (ref 65–99)
Potassium: 4.5 mmol/L (ref 3.5–5.1)
Sodium: 140 mmol/L (ref 135–145)

## 2017-08-24 LAB — SEROTONIN RELEASE ASSAY (SRA)
SRA .2 IU/mL UFH Ser-aCnc: 1 % (ref 0–20)
SRA 100IU/mL UFH Ser-aCnc: 1 % (ref 0–20)

## 2017-08-24 MED ORDER — CLOPIDOGREL BISULFATE 75 MG PO TABS: 75.0000 mg | ORAL_TABLET | Freq: Every day | ORAL | 1 refills | Status: DC

## 2017-08-24 MED ORDER — BISACODYL 5 MG PO TBEC
5.0000 mg | DELAYED_RELEASE_TABLET | Freq: Every day | ORAL | Status: DC | PRN
  Administered 2017-08-24: 5 mg via ORAL
  Filled 2017-08-24: qty 1

## 2017-08-24 MED ORDER — ACETAMINOPHEN 325 MG PO TABS: 650.0000 mg | ORAL_TABLET | Freq: Four times a day (QID) | ORAL | Status: DC | PRN

## 2017-08-24 MED ORDER — OXYCODONE HCL 5 MG PO TABS: 5.0000 mg | ORAL_TABLET | Freq: Four times a day (QID) | ORAL | 0 refills | Status: DC | PRN

## 2017-08-24 MED ORDER — ASPIRIN 325 MG PO TBEC: 325.0000 mg | DELAYED_RELEASE_TABLET | Freq: Every day | ORAL | 0 refills | Status: DC

## 2017-08-24 MED ORDER — METOPROLOL TARTRATE 25 MG PO TABS: 12.5000 mg | ORAL_TABLET | Freq: Two times a day (BID) | ORAL | 1 refills | Status: DC

## 2017-08-24 MED ORDER — ATORVASTATIN CALCIUM 80 MG PO TABS
80.0000 mg | ORAL_TABLET | Freq: Every day | ORAL | 1 refills | Status: DC
Start: 2017-08-24 — End: 2021-10-21

## 2017-08-24 MED ORDER — POLYETHYLENE GLYCOL 3350 17 G PO PACK
17.0000 g | PACK | Freq: Every day | ORAL | Status: DC
  Administered 2017-08-24: 17 g via ORAL
  Filled 2017-08-24: qty 1

## 2017-08-24 NOTE — Consult Note (Signed)
            [name] CM Primary Care Navigator  08/24/2017  Jordan Hebert 1940/05/04 [id number]   Seenpatientat the bedsideto identify possible discharge needs. Patientreportshaving "shortness of breath, chest pain" and diaphoresisthatresultedto this admission/ surgery. (angina, abnormal EKG, non-ST elevated myocardial infarction status post CABG- coronary artery bypass graft x 4)  Patientendorses Dr. Kirby Hebert with [facility] care provider.   Patient shared [facility] toobtain medications without difficulty.  Patient has beenmanaginghisown medications at home straight out of the containers, but verbalizedplan to use "pill box" when discharged.  Patientreports that he was driving prior to admission/ surgery but his wife Jordan Hebert) will be providingtransportation to hisdoctors'appointments after discharge.   Patientliveswith wife who will serve as his primary caregiver at home.   Anticipateddischarge planishomewithhome health servicesperpatient.  Patientvoiced understanding to call primary care provider's officewhen hereturnshomefor a post discharge follow-upvisitwithin1- 2weeksor sooner if needs arise.Patient letter (with PCP's contact number) was provided asareminder.   Discussed withpatient about [facility] CM services available for health management at home but he denies any needs or concerns at this point. Patientverbalized understandingto seekreferral from primary care provider to [facility] care management if deemednecessaryand appropriate for anyservices in the  future.  [facility] care management information was provided for future needs thatpatientmay have.  Patient however,verbally agreedforEMMIcalls tofollowup hisrecoveryat home.   Referral made for [facility] General calls after discharge.       For additional questions please contact:  Jordan Hebert A. Jordan Hebert, BSN, RN-BC [facility] PRIMARY CARE Navigator Cell: [phone]

## 2017-08-24 NOTE — Progress Notes (Signed)
CARDIAC REHAB PHASE I   Assisted pt out of BR. SaO2 96 RA. Ed completed with pt and family. Pt voiced understanding but in a joking mood. Will refer to [place] [facility].  [id number]  Harriet Masson CES, ACSM 08/24/2017 2:18 PM

## 2017-08-24 NOTE — Progress Notes (Signed)
Discharge order received. 2 peripheral IV sites discontinued from left arm with catheter tip intact. Patient tolerated well. Mobile telemetry monitor removed and CCMD notified. Patient discharge instructions reviewed to include medication regimen, follow up appointments, wound care, and activity following discharge. Written prescriptions (4 total) to include Oxycodone given to wife per patient request. Patient and family assisted with transfer to holding area upon discharge to unit while awaiting DME Oxygen therapy to be delivered. Patient and family voiced complaint of transfer to holding area following discharge from unit. Staff member assured patient and family that equipment would be delivered to holding area.

## 2017-08-24 NOTE — Progress Notes (Signed)
SATURATION QUALIFICATIONS:  Patient Saturations on Room Air at Rest = 92%  Patient Saturations on Room Air while Ambulating = 74%  Patient Saturations on 2 Liters of oxygen while Ambulating = 97%  Please briefly explain why patient needs home oxygen: Patient became short of breath on exertion. Patient rested during ambulation multiple times and was given instructions of proper deep breathing techniques. Oxygen saturation stayed between 74-82% during rest/ambulation until assisted to bed and supplemental oxygen therapy applied to regain proper oxygen saturation and tissue perfusion.

## 2017-08-24 NOTE — Progress Notes (Signed)
Order received to discontinue chest tube sutures. Patient informed of procedure. Patient lying in bed. Area cleansed with mild soap and saline wash to remove old dry drainage. 2 sutures removed from sub-xyphoid area. Steri-strips in place. Patient informed of site care following discharge home. Patient stated understanding. No concerns voiced. No drainage noted post suture removal. Patient tolerated well.

## 2017-08-24 NOTE — Progress Notes (Signed)
O2 sats. rm. air while asleep 84%. Applied O2 at 2 liters at O2 sats 94%

## 2017-08-24 NOTE — Progress Notes (Addendum)
      [address]       [place] [address]             [phone]      7 Days Post-Op Procedure(s) (LRB): CORONARY ARTERY BYPASS GRAFTING (CABG) x 4 , USING LEFT INTERNAL MAMMARY ARTERY AND GREATER SAPHENOUS VEIN HARVESTED ENDOSCOPICALLY (N/A) AORTIC VALVE REPLACEMENT (AVR) WITH INSPIRIS AORTIC VALVE (N/A) TRANSESOPHAGEAL ECHOCARDIOGRAM (TEE) (N/A) Subjective: No issues overnight.   Objective: Vital signs in last 24 hours: Temp:  [98.3 F (36.8 C)-98.7 F (37.1 C)] 98.3 F (36.8 C) (05/29 0437) Pulse Rate:  [36-105] 92 (05/28 2000) Cardiac Rhythm: Normal sinus rhythm (05/28 1900) Resp:  [17-23] 23 (05/28 2000) BP: (113-132)/(66-109) 113/66 (05/29 0437) SpO2:  [75 %-94 %] 92 % (05/28 2208) FiO2 (%):  [32 %] 32 % (05/28 0836) Weight:  [226 lb 10.1 oz (102.8 kg)] 226 lb 10.1 oz (102.8 kg) (05/29 0437)     Intake/Output from previous day: 05/28 0701 - 05/29 0700 In: 600 [P.O.:600] Out: 700 [Urine:700] Intake/Output this shift: No intake/output data recorded.  General appearance: alert, cooperative and no distress Heart: regular rate and rhythm, S1, S2 normal, no murmur, click, rub or gallop Lungs: clear to auscultation bilaterally Abdomen: soft, non-tender; bowel sounds normal; no masses,  no organomegaly Extremities: extremities normal, atraumatic, no cyanosis or edema Wound: clean and dry  Lab Results: Recent Labs    08/22/17 0545 08/24/17 0523  WBC 10.0 9.2  HGB 9.9* 9.4*  HCT 31.1* 28.9*  PLT 114* 187   BMET:  Recent Labs    08/22/17 0545 08/24/17 0523  NA 141 140  K 4.2 4.5  CL 103 103  CO2 30 29  GLUCOSE 117* 115*  BUN 15 17  CREATININE 0.66 0.68  CALCIUM 8.8* 8.7*    PT/INR: No results for input(s): LABPROT, INR in the last 72 hours. ABG    Component Value Date/Time   PHART 7.338 (L) 08/19/2017 0410   HCO3 29.1 (H) 08/19/2017 0410   TCO2 30 08/21/2017 0505   ACIDBASEDEF 3.0 (H) 08/18/2017 1641   O2SAT 58.9  08/21/2017 0512   CBG (last 3)  Recent Labs    08/21/17 1142 08/21/17 1641  GLUCAP 121* 99    Assessment/Plan: S/P Procedure(s) (LRB): CORONARY ARTERY BYPASS GRAFTING (CABG) x 4 , USING LEFT INTERNAL MAMMARY ARTERY AND GREATER SAPHENOUS VEIN HARVESTED ENDOSCOPICALLY (N/A) AORTIC VALVE REPLACEMENT (AVR) WITH INSPIRIS AORTIC VALVE (N/A) TRANSESOPHAGEAL ECHOCARDIOGRAM (TEE) (N/A)  1. CV-NSR in the 90s, BP well controlled 2. Pulm-tolerating room air with okay oxygen saturation. Encouraged incentive spirometer.  3. Renal-creatinine 0.68, electrolytes okay 4. H and H-stable 9.4/28.9, platelets 187k 5. Endo-blood glucose level well controlled 6. Constipation- added miralax and dulcolax  Plan: Discharge today   LOS: 9 days    Sharlene Dory 08/24/2017

## 2017-08-25 DIAGNOSIS — J449 Chronic obstructive pulmonary disease, unspecified: Secondary | ICD-10-CM | POA: Diagnosis not present

## 2017-08-25 DIAGNOSIS — I350 Nonrheumatic aortic (valve) stenosis: Secondary | ICD-10-CM | POA: Diagnosis not present

## 2017-08-28 DIAGNOSIS — Z7902 Long term (current) use of antithrombotics/antiplatelets: Secondary | ICD-10-CM | POA: Diagnosis not present

## 2017-08-28 DIAGNOSIS — I214 Non-ST elevation (NSTEMI) myocardial infarction: Secondary | ICD-10-CM | POA: Diagnosis not present

## 2017-08-28 DIAGNOSIS — E7800 Pure hypercholesterolemia, unspecified: Secondary | ICD-10-CM | POA: Diagnosis not present

## 2017-08-28 DIAGNOSIS — Z951 Presence of aortocoronary bypass graft: Secondary | ICD-10-CM | POA: Diagnosis not present

## 2017-08-28 DIAGNOSIS — E669 Obesity, unspecified: Secondary | ICD-10-CM | POA: Diagnosis not present

## 2017-08-28 DIAGNOSIS — Z48812 Encounter for surgical aftercare following surgery on the circulatory system: Secondary | ICD-10-CM | POA: Diagnosis not present

## 2017-08-28 DIAGNOSIS — Z7982 Long term (current) use of aspirin: Secondary | ICD-10-CM | POA: Diagnosis not present

## 2017-08-28 DIAGNOSIS — E039 Hypothyroidism, unspecified: Secondary | ICD-10-CM | POA: Diagnosis not present

## 2017-08-28 DIAGNOSIS — Z952 Presence of prosthetic heart valve: Secondary | ICD-10-CM | POA: Diagnosis not present

## 2017-08-28 DIAGNOSIS — I25110 Atherosclerotic heart disease of native coronary artery with unstable angina pectoris: Secondary | ICD-10-CM | POA: Diagnosis not present

## 2017-08-28 DIAGNOSIS — M48061 Spinal stenosis, lumbar region without neurogenic claudication: Secondary | ICD-10-CM | POA: Diagnosis not present

## 2017-08-28 DIAGNOSIS — F17210 Nicotine dependence, cigarettes, uncomplicated: Secondary | ICD-10-CM | POA: Diagnosis not present

## 2017-08-28 DIAGNOSIS — J42 Unspecified chronic bronchitis: Secondary | ICD-10-CM | POA: Diagnosis not present

## 2017-08-28 DIAGNOSIS — F329 Major depressive disorder, single episode, unspecified: Secondary | ICD-10-CM | POA: Diagnosis not present

## 2017-08-29 ENCOUNTER — Telehealth: Payer: Self-pay

## 2017-08-29 NOTE — Telephone Encounter (Signed)
Threasa AlphaJim Hoffman, Physical Therapist with [facility] called for verbal orders for Physical Therapy 2 times a week for 1 week.  Verbal orders given.  He also advised that the patient has a 0.25 inch redness to his right lower extremity [facility] with no swelling, drainage, temperature.  I advised PT to advise patient to keep watch on the incision site and to give the office a call if any changes in the incision site are noticed or if he starts to run a temperature.  Also, patient has plans to go to the beach next week.  Dr. Donata ClayVan Trigt will determine whether this is a good idea since patient was just discharged from the hospital.

## 2017-08-29 NOTE — Telephone Encounter (Signed)
Margie, nursing with [facility] called over the weekend about Mr. Jordan Hebert's low heart rate of 49.  She stated that she spoke with Dr. Cornelius Hebert on call and stated that he was not going to change anything.  All questions are answered.

## 2017-09-02 ENCOUNTER — Encounter: Payer: Self-pay | Admitting: Cardiothoracic Surgery

## 2017-09-02 ENCOUNTER — Ambulatory Visit: Payer: PPO | Admitting: Cardiovascular Disease

## 2017-09-02 ENCOUNTER — Other Ambulatory Visit: Payer: Self-pay

## 2017-09-02 DIAGNOSIS — T8149XA Infection following a procedure, other surgical site, initial encounter: Secondary | ICD-10-CM

## 2017-09-02 MED ORDER — CEPHALEXIN 500 MG PO CAPS: 500.0000 mg | ORAL_CAPSULE | Freq: Three times a day (TID) | ORAL | 0 refills | Status: AC

## 2017-09-02 MED ORDER — DOXYCYCLINE HYCLATE 100 MG PO TABS: 100.0000 mg | ORAL_TABLET | Freq: Every day | ORAL | 0 refills | Status: AC

## 2017-09-05 ENCOUNTER — Telehealth (HOSPITAL_COMMUNITY): Payer: Self-pay

## 2017-09-06 ENCOUNTER — Telehealth (HOSPITAL_COMMUNITY): Payer: Self-pay

## 2017-09-06 NOTE — Telephone Encounter (Signed)
Patients insurance is active and benefits verified through [facility] - $15.00 co-pay, no deductible, out of pocket amount of $3,400/$0.00 has been met, no co-insurance, and no pre-authorization is required. Reference [id number]  Will contact patient to see if he is interested in the Cardiac Rehab Program. If interested, patient will need to complete follow up appt. Once completed, patient will be contacted for scheduling upon review by the RN Navigator.

## 2017-09-06 NOTE — Telephone Encounter (Signed)
Called and spoke with wife patient in regards to Cardiac Rehab - Wife stated he is interested in the program. He is currently receiving HHPT. Explained scheduling process to wife and went over insurance, wife verbalized understanding. Will contact patient once follow up and HHPT has been completed.

## 2017-09-13 ENCOUNTER — Ambulatory Visit (INDEPENDENT_AMBULATORY_CARE_PROVIDER_SITE_OTHER): Payer: PPO | Admitting: Cardiovascular Disease

## 2017-09-13 ENCOUNTER — Encounter: Payer: Self-pay | Admitting: Cardiovascular Disease

## 2017-09-13 ENCOUNTER — Encounter

## 2017-09-13 VITALS — BP 103/67 | HR 78 | Ht 70.0 in | Wt 205.6 lb

## 2017-09-13 DIAGNOSIS — Z951 Presence of aortocoronary bypass graft: Secondary | ICD-10-CM

## 2017-09-13 DIAGNOSIS — E785 Hyperlipidemia, unspecified: Secondary | ICD-10-CM | POA: Insufficient documentation

## 2017-09-13 DIAGNOSIS — I350 Nonrheumatic aortic (valve) stenosis: Secondary | ICD-10-CM

## 2017-09-13 DIAGNOSIS — Z952 Presence of prosthetic heart valve: Secondary | ICD-10-CM

## 2017-09-13 DIAGNOSIS — Z720 Tobacco use: Secondary | ICD-10-CM | POA: Diagnosis not present

## 2017-09-13 NOTE — Assessment & Plan Note (Signed)
60 -pack-year tobacco abuse having quit at the time of bypass surgery.

## 2017-09-13 NOTE — Assessment & Plan Note (Signed)
Moderate aortic stenosis on 2D echocardiogram status post AVR with a 23 mm pericardial Edwards tissue valve performed at the time of coronary artery bypass grafting.  We will get a 2D echo in 3 months as his initial post AVR evaluation.

## 2017-09-13 NOTE — Assessment & Plan Note (Signed)
History of CAD status post admission for unstable angina/non-STEMI 08/16/2016 catheterization performed by Dr. Tresa EndoKelly revealing a 95% left main, total circumflex and 70% RCA stenosis normal LV function.  He did have a murmur on exam and echo revealed moderate aortic stenosis.  The following day he underwent coronary artery bypass grafting x4 by Dr. Maren BeachVantrigt with a LIMA to the LAD, vein to the ramus branch, diagonal branch and circumflex obtuse marginal branch/PDA.  He has done well since and feels clinically improved.

## 2017-09-13 NOTE — Patient Instructions (Signed)
Medication Instructions: Your physician recommends that you continue on your current medications as directed. Please refer to the Current Medication list given to you today.   Testing/Procedures: Your physician has requested that you have an echocardiogram in 3months. Echocardiography is a painless test that uses sound waves to create images of your heart. It provides your doctor with information about the size and shape of your heart and how well your heart's chambers and valves are working. This procedure takes approximately one hour. There are no restrictions for this procedure.  Follow-Up: You have been referred to Cardiac Rehab.  Your physician wants you to follow-up in: 6 months with Dr. Allyson SabalBerry. You will receive a reminder letter in the mail two months in advance. If you don't receive a letter, please call our office to schedule the follow-up appointment.  If you need a refill on your cardiac medications before your next appointment, please call your pharmacy.

## 2017-09-13 NOTE — Progress Notes (Signed)
09/13/2017 [name]   03-Jul-1940  [id number]  Primary Physician Kirby Funk, MD Primary Cardiologist: Runell Gess MD Nicholes Calamity, [place]  HPI:  Jordan Hebert is a 77 y.o. moderately overweight married Caucasian male father of 2, grandfather of 1 grandchild accompanied by his wife today.  He is here for his first post hospital office visit.  His primary care provider is Dr. Kirby Funk.  He has a history of tobacco abuse and hyperlipidemia.  He had stenting 22 years ago.  He was seen in the hospital 08/16/2017 with unstable angina and non-STEMI.  He underwent cardiac catheterization the same day by Dr. Tresa Endo revealing severe left main disease with an occluded circumflex and high-grade RCA stenosis with normal LV function.  Murmur was auscultated.  2D echo revealed moderate aortic stenosis.  The following day he underwent coronary artery bypass grafting by Dr. Maren Beach with LIMA to his ankle branch, vein to the ramus branch, marginal branch and the PDA.  He also had a 23 mm Edwards pericardial tissue valve implanted by Dr. Maren Beach as well.  His postop course uneventful.,   Current Meds  Medication Sig  . acetaminophen (TYLENOL) 325 MG tablet Take 2 tablets (650 mg total) by mouth every 6 (six) hours as needed for mild pain.  Marland Kitchen aspirin 325 MG EC tablet Take 1 tablet (325 mg total) by mouth daily.  Marland Kitchen atorvastatin (LIPITOR) 80 MG tablet Take 1 tablet (80 mg total) by mouth daily at 6 PM.  . clopidogrel (PLAVIX) 75 MG tablet Take 1 tablet (75 mg total) by mouth daily.  . cyclobenzaprine (FLEXERIL) 10 MG tablet Take 10 mg by mouth 2 (two) times daily as needed for muscle spasms.   Marland Kitchen levothyroxine (SYNTHROID, LEVOTHROID) 175 MCG tablet Take 175 mcg by mouth daily before breakfast.  . metoprolol tartrate (LOPRESSOR) 25 MG tablet Take 0.5 tablets (12.5 mg total) by mouth 2 (two) times daily.  Marland Kitchen oxyCODONE (OXY IR/ROXICODONE) 5 MG immediate release tablet Take 1 tablet (5 mg total)  by mouth every 6 (six) hours as needed for severe pain.  . vitamin B-12 (CYANOCOBALAMIN) 1000 MCG tablet Take 1,000 mcg by mouth daily.     No Known Allergies  Social History   Socioeconomic History  . Marital status: Married    Spouse name: Not on file  . Number of children: 2  . Years of education: Not on file  . Highest education level: Not on file  Occupational History  . Occupation: retired  Engineer, production  . Financial resource strain: Not on file  . Food insecurity:    Worry: Not on file    Inability: Not on file  . Transportation needs:    Medical: Not on file    Non-medical: Not on file  Tobacco Use  . Smoking status: Current Every Day Smoker  . Smokeless tobacco: Never Used  Substance and Sexual Activity  . Alcohol use: Yes  . Drug use: Not on file  . Sexual activity: Not on file  Lifestyle  . Physical activity:    Days per week: Not on file    Minutes per session: Not on file  . Stress: Not on file  Relationships  . Social connections:    Talks on phone: Not on file    Gets together: Not on file    Attends religious service: Not on file    Active member of club or organization: Not on file    Attends meetings  of clubs or organizations: Not on file    Relationship status: Not on file  . Intimate partner violence:    Fear of current or ex partner: Not on file    Emotionally abused: Not on file    Physically abused: Not on file    Forced sexual activity: Not on file  Other Topics Concern  . Not on file  Social History Narrative   He lives with wife.    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  Cardiovascular: negative for chest pain, dyspnea on exertion, edema, orthopnea, palpitations, paroxysmal nocturnal dyspnea or shortness of breath Dermatological: negative for rash Respiratory: negative for cough or wheezing Urologic: negative for  hematuria Abdominal: negative for nausea, vomiting, diarrhea, bright red blood per rectum, melena, or hematemesis Neurologic: negative for visual changes, syncope, or dizziness All other systems reviewed and are otherwise negative except as noted above.    Blood pressure 103/67, pulse 78, height 5\' 10"  (1.778 m), weight 205 lb 9.6 oz (93.3 kg).  General appearance: alert and no distress Neck: no adenopathy, no carotid bruit, no JVD, supple, symmetrical, trachea midline and thyroid not enlarged, symmetric, no tenderness/mass/nodules Lungs: clear to auscultation bilaterally Heart: regular rate and rhythm, S1, S2 normal, no murmur, click, rub or gallop Extremities: extremities normal, atraumatic, no cyanosis or edema Pulses: 2+ and symmetric Skin: Skin color, texture, turgor normal. No rashes or lesions Neurologic: Alert and oriented X 3, normal strength and tone. Normal symmetric reflexes. Normal coordination and gait  EKG performed today  ASSESSMENT AND PLAN:   Hyperlipidemia History of hyperlipidemia on statin therapy with lipid profile performed 08/16/2017 revealing total cholesterol 127, LDL 60 and HDL of 43.  S/P CABG x 4 History of CAD status post admission for unstable angina/non-STEMI 08/16/2016 catheterization performed by Dr. Tresa EndoKelly revealing a 95% left main, total circumflex and 70% RCA stenosis normal LV function.  He did have a murmur on exam and echo revealed moderate aortic stenosis.  The following day he underwent coronary artery bypass grafting x4 by Dr. Maren BeachVantrigt with a LIMA to the LAD, vein to the ramus branch, diagonal branch and circumflex obtuse marginal branch/PDA.  He has done well since and feels clinically improved.  S/P AVR Moderate aortic stenosis on 2D echocardiogram status post AVR with a 23 mm pericardial Edwards tissue valve performed at the time of coronary artery bypass grafting.  We will get a 2D echo in 3 months as his initial post AVR  evaluation.      Runell GessJonathan J. Quayshaun Hubbert MD FACP,FACC,FAHA, [facility] 09/13/2017 2:54 PM

## 2017-09-13 NOTE — Assessment & Plan Note (Signed)
History of hyperlipidemia on statin therapy with lipid profile performed 08/16/2017 revealing total cholesterol 127, LDL 60 and HDL of 43.

## 2017-09-16 ENCOUNTER — Telehealth: Payer: Self-pay

## 2017-09-16 ENCOUNTER — Telehealth (HOSPITAL_COMMUNITY): Payer: Self-pay

## 2017-09-16 DIAGNOSIS — I350 Nonrheumatic aortic (valve) stenosis: Secondary | ICD-10-CM | POA: Diagnosis not present

## 2017-09-16 DIAGNOSIS — I2510 Atherosclerotic heart disease of native coronary artery without angina pectoris: Secondary | ICD-10-CM | POA: Diagnosis not present

## 2017-09-16 NOTE — Telephone Encounter (Signed)
[name], PT called from [facility] at [phone] requesting verbal orders for PT since patient missed 2 appointments for this week.  He requested for 2 days next week to see him.  Verbal orders given, and acknowledged receipt.

## 2017-09-16 NOTE — Telephone Encounter (Signed)
Attempted to call patient in regards to Cardiac Rehab - LM on VM 

## 2017-09-20 ENCOUNTER — Telehealth: Payer: Self-pay

## 2017-09-20 ENCOUNTER — Other Ambulatory Visit: Payer: Self-pay | Admitting: Cardiothoracic Surgery

## 2017-09-20 DIAGNOSIS — Z951 Presence of aortocoronary bypass graft: Secondary | ICD-10-CM

## 2017-09-20 NOTE — Telephone Encounter (Signed)
[name], PT with [facility] called and stated that Mr. Daphine DeutscherMartin upon seeing him for PT today [facility].  He is s/p CABG x4 08/17/2017 with Dr. Donata ClayVan Trigt.  He stated that the patient said it was ripped off from a blanket he had on top of him.  [name] went on to say that nursing has been stopped d/t insurance reasons.  The site looked to be in good condition per [name].  No signs or symptoms of infection were noted (swelling, redness, red streaks, puss formation, fowl odor or smell, or temperature).  I reiterated what he told the patient to make sure he keeps the site clean and dry and could keep it covered with a clean dry bandage and remove it for showers and let soap and water clean the site.  He acknowledged receipt and also made aware to give us another call back if the site changes in anyway.

## 2017-09-21 ENCOUNTER — Other Ambulatory Visit: Payer: Self-pay | Admitting: Internal Medicine

## 2017-09-21 ENCOUNTER — Encounter: Payer: Self-pay | Admitting: Cardiothoracic Surgery

## 2017-09-21 ENCOUNTER — Other Ambulatory Visit: Payer: Self-pay

## 2017-09-21 ENCOUNTER — Ambulatory Visit
Admission: RE | Admit: 2017-09-21 | Discharge: 2017-09-21 | Disposition: A | Discharge status: Home / Self Care | Payer: PPO | Source: Ambulatory Visit | Attending: Cardiothoracic Surgery | Admitting: Cardiothoracic Surgery

## 2017-09-21 ENCOUNTER — Ambulatory Visit (INDEPENDENT_AMBULATORY_CARE_PROVIDER_SITE_OTHER): Payer: PPO | Admitting: Cardiothoracic Surgery

## 2017-09-21 VITALS — BP 109/75 | HR 75 | Resp 16 | Ht 70.0 in | Wt 205.0 lb

## 2017-09-21 DIAGNOSIS — Z951 Presence of aortocoronary bypass graft: Secondary | ICD-10-CM

## 2017-09-21 DIAGNOSIS — I350 Nonrheumatic aortic (valve) stenosis: Secondary | ICD-10-CM

## 2017-09-21 DIAGNOSIS — I2510 Atherosclerotic heart disease of native coronary artery without angina pectoris: Secondary | ICD-10-CM

## 2017-09-21 DIAGNOSIS — Z952 Presence of prosthetic heart valve: Secondary | ICD-10-CM

## 2017-09-21 DIAGNOSIS — J90 Pleural effusion, not elsewhere classified: Secondary | ICD-10-CM | POA: Diagnosis not present

## 2017-09-21 NOTE — Progress Notes (Signed)
PCP is Kirby FunkGriffin, John, MD Referring Provider is Lennette BihariKelly, Thomas A, MD  Chief Complaint  Patient presents with  . Routine Post Op    f/u from surgery with CXR s/p CABG x4, AVR 08/17/2017    HPI: 1 month follow-up after urgent CABG x4 with combined AVR for unstable angina, non-STEMI, moderate aortic stenosis.  Patient has  done well.  Strength and energy significantly improved.  No recurrent angina.  No symptoms of CHF.  Surgical incision is healing well.  Scheduled to start outpatient cardiac rehab in the next 3 to 4 weeks.  He is walking at home daily.  Chest x-ray today is clear.   Past Medical History:  Diagnosis Date  . Back pain   . Colon polyps   . Degenerative lumbar spinal stenosis   . Depression   . Erectile dysfunction   . Gynecomastia   . High cholesterol   . Hyperthyroidism   . Rheumatic fever   . Seborrhea   . Umbilical hernia     Past Surgical History:  Procedure Laterality Date  . ABDOMINAL AORTOGRAM N/A 08/16/2017   Procedure: ABDOMINAL AORTOGRAM;  Surgeon: Lennette BihariKelly, Thomas A, MD;  [facility];  Service: Cardiovascular;  Laterality: N/A;  . AORTIC VALVE REPLACEMENT N/A 08/17/2017   Procedure: AORTIC VALVE REPLACEMENT (AVR) WITH 23MM INSPIRIS AORTIC VALVE;  Surgeon: Kerin PernaVan Trigt, Peter, MD;  [facility];  Service: Open Heart Surgery;  Laterality: N/A;  . CORONARY ANGIOGRAPHY N/A 08/16/2017   Procedure: CORONARY ANGIOGRAPHY;  Surgeon: Lennette BihariKelly, Thomas A, MD;  Location: [facility] INVASIVE CV LAB;  Service: Cardiovascular;  Laterality: N/A;  . CORONARY ARTERY BYPASS GRAFT N/A 08/17/2017   Procedure: CORONARY ARTERY BYPASS GRAFTING (CABG) x 4 , USING LEFT INTERNAL MAMMARY ARTERY AND GREATER SAPHENOUS VEIN HARVESTED ENDOSCOPICALLY;  Surgeon: Kerin PernaVan Trigt, Peter, MD;  [facility];  Service: Open Heart Surgery;  Laterality: N/A;  . CORONARY STENT PLACEMENT    . TEE WITHOUT CARDIOVERSION N/A 08/17/2017   Procedure: TRANSESOPHAGEAL ECHOCARDIOGRAM (TEE);  Surgeon: Donata ClayVan Trigt,  Theron AristaPeter, MD;  [facility];  Service: Open Heart Surgery;  Laterality: N/A;    Family History  Problem Relation Age of Onset  . Stroke Mother   . Cancer Brother   . Aneurysm Sister        Cerebral   . Other Son        Guillain-Barre Syndrome    Social History Social History   Tobacco Use  . Smoking status: Former Smoker    Types: Cigarettes    Last attempt to quit: 08/15/2017    Years since quitting: 0.1  . Smokeless tobacco: Never Used  Substance Use Topics  . Alcohol use: Yes  . Drug use: Not on file    Current Outpatient Medications  Medication Sig Dispense Refill  . acetaminophen (TYLENOL) 325 MG tablet Take 2 tablets (650 mg total) by mouth every 6 (six) hours as needed for mild pain.    Marland Kitchen. aspirin 325 MG EC tablet Take 1 tablet (325 mg total) by mouth daily. 30 tablet 0  . atorvastatin (LIPITOR) 80 MG tablet Take 1 tablet (80 mg total) by mouth daily at 6 PM. 30 tablet 1  . clopidogrel (PLAVIX) 75 MG tablet Take 1 tablet (75 mg total) by mouth daily. 30 tablet 1  . cyclobenzaprine (FLEXERIL) 10 MG tablet Take 10 mg by mouth 2 (two) times daily as needed for muscle spasms.     Marland Kitchen. levothyroxine (SYNTHROID, LEVOTHROID) 175 MCG tablet  Take 175 mcg by mouth daily before breakfast.    . metoprolol tartrate (LOPRESSOR) 25 MG tablet Take 0.5 tablets (12.5 mg total) by mouth 2 (two) times daily. 60 tablet 1  . vitamin B-12 (CYANOCOBALAMIN) 1000 MCG tablet Take 1,000 mcg by mouth daily.     No current facility-administered medications for this visit.     No Known Allergies  Review of Systems  Non-smoking 2 months No edema No fever Appetite improved  BP 109/75 (BP Location: Right Arm, Patient Position: Sitting, Cuff Size: Normal)   Pulse 75   Resp 16   Ht 5\' 10"  (1.778 m)   Wt 205 lb (93 kg)   SpO2 95% Comment: RA  BMI 29.41 kg/m  Physical Exam      Exam    General- alert and comfortable.  Sternal incision stable well-healed.    Neck- no JVD, no cervical  adenopathy palpable, no carotid bruit   Lungs- clear without rales, wheezes   Cor- regular rate and rhythm, no murmur , gallop   Abdomen- soft, non-tender   Extremities - warm, non-tender, minimal edema.  Vein site with eschar and serosanguineous drainage treated with silver nitrate cauterization and Neosporin Band-Aid dressing   Neuro- oriented, appropriate, no focal weakness   Diagnostic Tests: Chest x-ray clear  Impression: Excellent early recovery after urgent multivessel CABG with combined tissue AVR for moderate aortic stenosis.  We will stop the Plavix.  He will continue his other medications.  We discussed increasing activity levels to include driving, lifting up to 09-81 pounds maximum.  He will return for follow-up exam in 8 weeks to assess progress.  Plan: Stop Plavix but continue other medications.  Importance of heart healthy diet stressed to the patient.  Patient recommended to walk 20 minutes minimum every day.   Mikey Bussing, MD [facility] [phone]

## 2017-09-25 DIAGNOSIS — I350 Nonrheumatic aortic (valve) stenosis: Secondary | ICD-10-CM | POA: Diagnosis not present

## 2017-09-25 DIAGNOSIS — J449 Chronic obstructive pulmonary disease, unspecified: Secondary | ICD-10-CM | POA: Diagnosis not present

## 2017-09-26 ENCOUNTER — Telehealth (HOSPITAL_COMMUNITY): Payer: Self-pay

## 2017-09-26 DIAGNOSIS — M10072 Idiopathic gout, left ankle and foot: Secondary | ICD-10-CM | POA: Diagnosis not present

## 2017-09-26 DIAGNOSIS — H6123 Impacted cerumen, bilateral: Secondary | ICD-10-CM | POA: Diagnosis not present

## 2017-09-26 NOTE — Telephone Encounter (Signed)
2nd attempt to contact patient in regards to Cardiac Rehab - lm on vm. Sending letter. °

## 2017-09-28 ENCOUNTER — Telehealth: Payer: Self-pay

## 2017-09-28 ENCOUNTER — Telehealth: Payer: Self-pay | Admitting: Cardiovascular Disease

## 2017-09-28 NOTE — Telephone Encounter (Signed)
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 Valentina LucksGriffin, pt is walking minimally to allow it to heel, [name] is currently at end of physical therapy visits and is requesting orders to continue to see him until the end of thid month.

## 2017-09-28 NOTE — Telephone Encounter (Signed)
Threasa AlphaJim Hoffman, PT with [facility] called to notify patient was diagnosed with gout by his PCP and was told to avoid walking to let it heal.  Also, wanted to get VO to extend Therapy until he starts Cardiac Rehab in August.  Verbal orders given and receipt acknowledged.

## 2017-09-28 NOTE — Telephone Encounter (Signed)
[name]. Advised that patient has been getting home health PT orders from [facility]. He is aware of this but didn't know if it was time to change these orders over to cardiology. Suggested that he contact Dr. Zenaida NieceVan Trigt's office for additional orders.

## 2017-09-28 NOTE — Telephone Encounter (Signed)
LMTCB  Unable to locate in Epic where Dr. Allyson SabalBerry has ordered home health PT

## 2017-09-30 ENCOUNTER — Other Ambulatory Visit: Payer: Self-pay | Admitting: Physician Assistant

## 2017-10-22 ENCOUNTER — Other Ambulatory Visit: Payer: Self-pay | Admitting: Physician Assistant

## 2017-10-24 ENCOUNTER — Telehealth: Payer: Self-pay

## 2017-10-24 NOTE — Telephone Encounter (Signed)
[name], PT with [facility] [phone] called and left a voicemail message stating Jordan Hebert cancelled his Tuesday visit this week, but still seems interested in working with home health.  He stated that he would visit again next week and then patient would be going to outpatient rehab after.  Will continue to monitor.

## 2017-11-02 ENCOUNTER — Telehealth (HOSPITAL_COMMUNITY): Payer: Self-pay | Admitting: Pharmacist

## 2017-11-02 NOTE — Telephone Encounter (Signed)
Cardiac Rehab Medication Review by a Pharmacist  Does the patient  feel that his/her medications are working for him/her?  yes  Has the patient been experiencing any side effects to the medications prescribed?  no  Does the patient measure his/her own blood pressure or blood glucose at home?  No, he states he used to have home health to check BP and "it was always under control"  Does the patient have any problems obtaining medications due to transportation or finances?   no  Understanding of regimen: good Understanding of indications: good Potential of compliance: good  Pharmacist comments: Patient has good understanding his medication regimen. I added sertraline to his medication list today.   Wendelyn Breslowylan Hanna, PharmD PGY1 Pharmacy Resident Phone: [phone] 11/02/2017 4:31 PM

## 2017-11-10 ENCOUNTER — Encounter (HOSPITAL_COMMUNITY)
Admission: RE | Admit: 2017-11-10 | Discharge: 2017-11-10 | Disposition: A | Discharge status: Home / Self Care | Payer: PPO | Source: Ambulatory Visit | Attending: Cardiovascular Disease | Admitting: Cardiovascular Disease

## 2017-11-10 ENCOUNTER — Encounter (HOSPITAL_COMMUNITY): Payer: Self-pay

## 2017-11-10 VITALS — BP 106/70 | HR 72 | Ht 68.75 in | Wt 210.8 lb

## 2017-11-10 DIAGNOSIS — Z951 Presence of aortocoronary bypass graft: Secondary | ICD-10-CM | POA: Insufficient documentation

## 2017-11-10 DIAGNOSIS — E7800 Pure hypercholesterolemia, unspecified: Secondary | ICD-10-CM | POA: Insufficient documentation

## 2017-11-10 DIAGNOSIS — I214 Non-ST elevation (NSTEMI) myocardial infarction: Secondary | ICD-10-CM | POA: Insufficient documentation

## 2017-11-10 DIAGNOSIS — Z87891 Personal history of nicotine dependence: Secondary | ICD-10-CM | POA: Insufficient documentation

## 2017-11-10 DIAGNOSIS — E0590 Thyrotoxicosis, unspecified without thyrotoxic crisis or storm: Secondary | ICD-10-CM | POA: Insufficient documentation

## 2017-11-10 DIAGNOSIS — Z79899 Other long term (current) drug therapy: Secondary | ICD-10-CM | POA: Insufficient documentation

## 2017-11-10 DIAGNOSIS — Z79890 Hormone replacement therapy (postmenopausal): Secondary | ICD-10-CM | POA: Insufficient documentation

## 2017-11-10 DIAGNOSIS — Z952 Presence of prosthetic heart valve: Secondary | ICD-10-CM

## 2017-11-10 HISTORY — DX: Atherosclerotic heart disease of native coronary artery without angina pectoris: I25.10

## 2017-11-10 NOTE — Progress Notes (Signed)
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  acetaminophen (TYLENOL) 325 MG tablet, Take 2 tablets (650 mg total) by mouth every 6 (six) hours as needed for mild pain., Disp: , Rfl:  .  aspirin 325 MG EC tablet, Take 1 tablet (325 mg total) by mouth daily., Disp: 30 tablet, Rfl: 0 .  atorvastatin (LIPITOR) 80 MG tablet, Take 1 tablet (80 mg total) by mouth daily at 6 PM., Disp: 30 tablet, Rfl: 1 .  clopidogrel (PLAVIX) 75 MG tablet, Take 1 tablet (75 mg total) by mouth daily., Disp: 30 tablet, Rfl: 1 .  cyclobenzaprine (FLEXERIL) 10 MG tablet, Take 10 mg by mouth 2 (two) times daily as needed for muscle spasms. , Disp: , Rfl:  .  levothyroxine (SYNTHROID, LEVOTHROID) 175 MCG tablet, Take 175 mcg by mouth daily before breakfast., Disp: , Rfl:  .  metoprolol tartrate (LOPRESSOR) 25 MG tablet, Take 0.5 tablets (12.5 mg total) by mouth 2 (two) times daily., Disp: 60 tablet, Rfl: 1 .  sertraline (ZOLOFT) 50 MG tablet, Take 50 mg by mouth daily., Disp: , Rfl:  .  vitamin B-12 (CYANOCOBALAMIN) 1000 MCG tablet, Take 1,000 mcg by mouth daily., Disp: , Rfl:   Past Medical History: Past Medical History:  Diagnosis Date  . Back pain   . Colon polyps   . Coronary artery disease   . Degenerative lumbar spinal stenosis   . Depression   . Erectile dysfunction   . Gynecomastia   . High cholesterol   . Hyperthyroidism   . Rheumatic fever   . Seborrhea   . Umbilical hernia     Tobacco Use: Social History   Tobacco Use  Smoking Status Former Smoker  .  Types: Cigarettes  . Last attempt to quit: 08/15/2017  . Years since quitting: 0.2  Smokeless Tobacco Never Used    Labs: Recent Review Advice worker    Labs for ITP Cardiac and Pulmonary Rehab Latest Ref Rng & Units 08/19/2017 08/19/2017 08/19/2017 08/20/2017 08/21/2017   Cholestrol 0 - 200 mg/dL - - - - -   LDLCALC 0 - 99 mg/dL - - - - -   HDL >40 mg/dL - - - - -   Trlycerides <150 mg/dL - - - - -   Hemoglobin A1c 4.8 - 5.6 % - - - - -   PHART 7.350 - 7.450 7.338(L) - - - -   PCO2ART 32.0 - 48.0 mmHg 54.1(H) - - - -   HCO3 20.0 - 28.0 mmol/L 29.1(H) - - - -   TCO2 22 - 32 mmol/L 31 28 - 30 30   ACIDBASEDEF 0.0 - 2.0 mmol/L - - - - -   O2SAT % 94.0 - 66.2 74.6 58.9      Capillary Blood Glucose: Lab Results  Component Value Date   GLUCAP 99 08/21/2017   GLUCAP 121 (H) 08/21/2017   GLUCAP 116 (H) 08/21/2017   GLUCAP 112 (H) 08/20/2017   GLUCAP 131 (H) 08/20/2017     Exercise Target Goals: Exercise Program Goal: Individual exercise prescription set  using results from initial 6 min walk test and THRR while considering  patient's activity barriers and safety.   Exercise Prescription Goal: Initial exercise prescription builds to 30-45 minutes a day of aerobic activity, 2-3 days per week.  Home exercise guidelines will be given to patient during program as part of exercise prescription that the participant will acknowledge.  Activity Barriers & Risk Stratification: Activity Barriers & Cardiac Risk Stratification - 11/10/17 1439      Activity Barriers & Cardiac Risk Stratification   Activity Barriers  Arthritis;Back Problems;Joint Problems;Deconditioning;Muscular Weakness    Cardiac Risk Stratification  High       6 Minute Walk: 6 Minute Walk    Row Name 11/10/17 1427         6 Minute Walk   Phase  Initial     Distance  800 feet     Walk Time  4.12 minutes     # of Rest Breaks  0     MPH  2.2     METS  1.4     RPE  11     VO2 Peak  4.9     Symptoms  Yes (comment)      Comments  stopped walk test 4.12 due to back pain 7/10. Pt subsided with rest     Resting HR  72 bpm     Resting BP  106/70     Resting Oxygen Saturation   95 %     Exercise Oxygen Saturation  during 6 min walk  97 %     Max Ex. HR  101 bpm     Max Ex. BP  116/80        Oxygen Initial Assessment:   Oxygen Re-Evaluation:   Oxygen Discharge (Final Oxygen Re-Evaluation):   Initial Exercise Prescription: Initial Exercise Prescription - 11/10/17 1400      Date of Initial Exercise RX and Referring Provider   Date  11/10/17      Recumbant Bike   Level  1.5    Minutes  10    METs  1.5      NuStep   Level  2    SPM  70    Minutes  10    METs  1.5      Arm Ergometer   Level  2    Minutes  10    METs  1.5      Prescription Details   Frequency (times per week)  3    Duration  Progress to 30 minutes of continuous aerobic without signs/symptoms of physical distress      Intensity   THRR 40-80% of Max Heartrate  58-115    Ratings of Perceived Exertion  11-13    Perceived Dyspnea  0-4      Progression   Progression  Continue to progress workloads to maintain intensity without signs/symptoms of physical distress.      Resistance Training   Training Prescription  Yes    Weight  2lbs    Reps  10-15       Perform Capillary Blood Glucose checks as needed.  Exercise Prescription Changes:   Exercise Comments:   Exercise Goals and Review: Exercise Goals    Row Name 11/10/17 1354 11/10/17 1420 11/10/17 1439         Exercise Goals   Increase Physical Activity  Yes  -  -     Intervention  Provide advice, education, support and counseling about physical activity/exercise needs.;Develop an individualized  exercise prescription for aerobic and resistive training based on initial evaluation findings, risk stratification, comorbidities and participant's personal goals.  -  -     Expected Outcomes  Short Term: Attend rehab on a regular basis to increase amount of  physical activity.;Long Term: Add in home exercise to make exercise part of routine and to increase amount of physical activity.;Long Term: Exercising regularly at least 3-5 days a week.  -  -     Increase Strength and Stamina  Yes  - increase muscle tone, functional mobility, balance and overall endurance  Yes     Intervention  Provide advice, education, support and counseling about physical activity/exercise needs.;Develop an individualized exercise prescription for aerobic and resistive training based on initial evaluation findings, risk stratification, comorbidities and participant's personal goals.  -  -     Expected Outcomes  Short Term: Increase workloads from initial exercise prescription for resistance, speed, and METs.;Short Term: Perform resistance training exercises routinely during rehab and add in resistance training at home;Long Term: Improve cardiorespiratory fitness, muscular endurance and strength as measured by increased METs and functional capacity ( )  -  -     Able to understand and use rate of perceived exertion (RPE) scale  Yes  -  -     Intervention  Provide education and explanation on how to use RPE scale  -  -     Expected Outcomes  Short Term: Able to use RPE daily in rehab to express subjective intensity level;Long Term:  Able to use RPE to guide intensity level when exercising independently  -  -     Knowledge and understanding of Target Heart Rate Range (THRR)  Yes  -  -     Intervention  Provide education and explanation of THRR including how the numbers were predicted and where they are located for reference  -  -     Expected Outcomes  Short Term: Able to state/look up THRR;Long Term: Able to use THRR to govern intensity when exercising independently;Short Term: Able to use daily as guideline for intensity in rehab  -  -     Able to check pulse independently  Yes  -  -     Intervention  Provide education and demonstration on how to check pulse in carotid and radial  arteries.;Review the importance of being able to check your own pulse for safety during independent exercise  -  -     Expected Outcomes  Short Term: Able to explain why pulse checking is important during independent exercise;Long Term: Able to check pulse independently and accurately  -  -     Understanding of Exercise Prescription  Yes  -  -     Intervention  Provide education, explanation, and written materials on patient's individual exercise prescription  -  -     Expected Outcomes  Short Term: Able to explain program exercise prescription;Long Term: Able to explain home exercise prescription to exercise independently  -  -        Exercise Goals Re-Evaluation :   Discharge Exercise Prescription (Final Exercise Prescription Changes):   Nutrition:  Target Goals: Understanding of nutrition guidelines, daily intake of sodium 1500mg , cholesterol 200mg , calories 30% from fat and 7% or less from saturated fats, daily to have 5 or more servings of fruits and vegetables.  Biometrics: Pre Biometrics - 11/10/17 1440      Pre Biometrics   Height  5' 8.75" (1.746 m)    Weight  95.6 kg    Waist Circumference  44.5 inches    Hip Circumference  43.5 inches    Waist to Hip Ratio  1.02 %    BMI (Calculated)  31.36    Triceps Skinfold  18 mm    % Body Fat  32 %    Grip Strength  33 kg    Flexibility  --   LBP!   Single Leg Stand  0 seconds        Nutrition Therapy Plan and Nutrition Goals:   Nutrition Assessments:   Nutrition Goals Re-Evaluation:   Nutrition Goals Re-Evaluation:   Nutrition Goals Discharge (Final Nutrition Goals Re-Evaluation):   Psychosocial: Target Goals: Acknowledge presence or absence of significant depression and/or stress, maximize coping skills, provide positive support system. Participant is able to verbalize types and ability to use techniques and skills needed for reducing stress and depression.  Initial Review & Psychosocial Screening: Initial  Psych Review & Screening - 11/10/17 1650      Initial Review   Current issues with  None Identified      Family Dynamics   Good Support System?  Yes   Jordan CountsBob has his wife daughter's and granddaughter's for support     Barriers   Psychosocial barriers to participate in program  There are no identifiable barriers or psychosocial needs.      Screening Interventions   Interventions  Encouraged to exercise       Quality of Life Scores: Quality of Life - 11/10/17 1453      Quality of Life   Select  Quality of Life      Quality of Life Scores   Health/Function Pre  25.37 %    Socioeconomic Pre  28.33 %    Psych/Spiritual Pre  27.43 %    Family Pre  21.5 %    GLOBAL Pre  25.89 %      Scores of 19 and below usually indicate a poorer quality of life in these areas.  A difference of  2-3 points is a clinically meaningful difference.  A difference of 2-3 points in the total score of the Quality of Life Index has been associated with significant improvement in overall quality of life, self-image, physical symptoms, and general health in studies assessing change in quality of life.  PHQ-9: Recent Review Flowsheet Data    There is no flowsheet data to display.     Interpretation of Total Score  Total Score Depression Severity:  1-4 = Minimal depression, 5-9 = Mild depression, 10-14 = Moderate depression, 15-19 = Moderately severe depression, 20-27 = Severe depression   Psychosocial Evaluation and Intervention:   Psychosocial Re-Evaluation:   Psychosocial Discharge (Final Psychosocial Re-Evaluation):   Vocational Rehabilitation: Provide vocational rehab assistance to qualifying candidates.   Vocational Rehab Evaluation & Intervention: Vocational Rehab - 11/10/17 1652      Initial Vocational Rehab Evaluation & Intervention   Assessment shows need for Vocational Rehabilitation  No   Jordan CountsBob is retired and does not need vocational rehab at this time      Education: Education  Goals: Education classes will be provided on a weekly basis, covering required topics. Participant will state understanding/return demonstration of topics presented.  Learning Barriers/Preferences: Learning Barriers/Preferences - 11/10/17 1356      Learning Barriers/Preferences   Learning Barriers  Sight;Hearing    Learning Preferences  Written Material;Video       Education Topics: Count Your Pulse:  -Group instruction provided by verbal instruction, demonstration, patient  participation and written materials to support subject.  Instructors address importance of being able to find your pulse and how to count your pulse when at home without a heart monitor.  Patients get hands on experience counting their pulse with staff help and individually.   Heart Attack, Angina, and Risk Factor Modification:  -Group instruction provided by verbal instruction, video, and written materials to support subject.  Instructors address signs and symptoms of angina and heart attacks.    Also discuss risk factors for heart disease and how to make changes to improve heart health risk factors.   Functional Fitness:  -Group instruction provided by verbal instruction, demonstration, patient participation, and written materials to support subject.  Instructors address safety measures for doing things around the house.  Discuss how to get up and down off the floor, how to pick things up properly, how to safely get out of a chair without assistance, and balance training.   Meditation and Mindfulness:  -Group instruction provided by verbal instruction, patient participation, and written materials to support subject.  Instructor addresses importance of mindfulness and meditation practice to help reduce stress and improve awareness.  Instructor also leads participants through a meditation exercise.    Stretching for Flexibility and Mobility:  -Group instruction provided by verbal instruction, patient participation,  and written materials to support subject.  Instructors lead participants through series of stretches that are designed to increase flexibility thus improving mobility.  These stretches are additional exercise for major muscle groups that are typically performed during regular warm up and cool down.   Hands Only CPR:  -Group verbal, video, and participation provides a basic overview of AHA guidelines for community CPR. Role-play of emergencies allow participants the opportunity to practice calling for help and chest compression technique with discussion of AED use.   Hypertension: -Group verbal and written instruction that provides a basic overview of hypertension including the most recent diagnostic guidelines, risk factor reduction with self-care instructions and medication management.    Nutrition I class: Heart Healthy Eating:  -Group instruction provided by PowerPoint slides, verbal discussion, and written materials to support subject matter. The instructor gives an explanation and review of the Therapeutic Lifestyle Changes diet recommendations, which includes a discussion on lipid goals, dietary fat, sodium, fiber, plant stanol/sterol esters, sugar, and the components of a well-balanced, healthy diet.   Nutrition II class: Lifestyle Skills:  -Group instruction provided by PowerPoint slides, verbal discussion, and written materials to support subject matter. The instructor gives an explanation and review of label reading, grocery shopping for heart health, heart healthy recipe modifications, and ways to make healthier choices when eating out.   Diabetes Question & Answer:  -Group instruction provided by PowerPoint slides, verbal discussion, and written materials to support subject matter. The instructor gives an explanation and review of diabetes co-morbidities, pre- and post-prandial blood glucose goals, pre-exercise blood glucose goals, signs, symptoms, and treatment of hypoglycemia and  hyperglycemia, and foot care basics.   Diabetes Blitz:  -Group instruction provided by PowerPoint slides, verbal discussion, and written materials to support subject matter. The instructor gives an explanation and review of the physiology behind type 1 and type 2 diabetes, diabetes medications and rational behind using different medications, pre- and post-prandial blood glucose recommendations and Hemoglobin A1c goals, diabetes diet, and exercise including blood glucose guidelines for exercising safely.    Portion Distortion:  -Group instruction provided by PowerPoint slides, verbal discussion, written materials, and food models to support subject matter. The instructor gives  an explanation of serving size versus portion size, changes in portions sizes over the last 20 years, and what consists of a serving from each food group.   Stress Management:  -Group instruction provided by verbal instruction, video, and written materials to support subject matter.  Instructors review role of stress in heart disease and how to cope with stress positively.     Exercising on Your Own:  -Group instruction provided by verbal instruction, power point, and written materials to support subject.  Instructors discuss benefits of exercise, components of exercise, frequency and intensity of exercise, and end points for exercise.  Also discuss use of nitroglycerin and activating EMS.  Review options of places to exercise outside of rehab.  Review guidelines for sex with heart disease.   Cardiac Drugs I:  -Group instruction provided by verbal instruction and written materials to support subject.  Instructor reviews cardiac drug classes: antiplatelets, anticoagulants, beta blockers, and statins.  Instructor discusses reasons, side effects, and lifestyle considerations for each drug class.   Cardiac Drugs II:  -Group instruction provided by verbal instruction and written materials to support subject.  Instructor  reviews cardiac drug classes: angiotensin converting enzyme inhibitors (ACE-I), angiotensin II receptor blockers (ARBs), nitrates, and calcium channel blockers.  Instructor discusses reasons, side effects, and lifestyle considerations for each drug class.   Anatomy and Physiology of the Circulatory System:  Group verbal and written instruction and models provide basic cardiac anatomy and physiology, with the coronary electrical and arterial systems. Review of: AMI, Angina, Valve disease, Heart Failure, Peripheral Artery Disease, Cardiac Arrhythmia, Pacemakers, and the ICD.   Other Education:  -Group or individual verbal, written, or video instructions that support the educational goals of the cardiac rehab program.   Holiday Eating Survival Tips:  -Group instruction provided by PowerPoint slides, verbal discussion, and written materials to support subject matter. The instructor gives patients tips, tricks, and techniques to help them not only survive but enjoy the holidays despite the onslaught of food that accompanies the holidays.   Knowledge Questionnaire Score: Knowledge Questionnaire Score - 11/10/17 1442      Knowledge Questionnaire Score   Pre Score  20/24       Core Components/Risk Factors/Patient Goals at Admission: Personal Goals and Risk Factors at Admission - 11/10/17 1652      Core Components/Risk Factors/Patient Goals on Admission    Weight Management  Yes;Obesity    Intervention  Weight Management: Develop a combined nutrition and exercise program designed to reach desired caloric intake, while maintaining appropriate intake of nutrient and fiber, sodium and fats, and appropriate energy expenditure required for the weight goal.;Weight Management: Provide education and appropriate resources to help participant work on and attain dietary goals.;Weight Management/Obesity: Establish reasonable short term and long term weight goals.;Obesity: Provide education and appropriate  resources to help participant work on and attain dietary goals.    Admit Weight  210 lb 12.2 oz (95.6 kg)    Goal Weight: Short Term  200 lb (90.7 kg)    Goal Weight: Long Term  190 lb (86.2 kg)    Expected Outcomes  Short Term: Continue to assess and modify interventions until short term weight is achieved;Long Term: Adherence to nutrition and physical activity/exercise program aimed toward attainment of established weight goal;Weight Maintenance: Understanding of the daily nutrition guidelines, which includes 25-35% calories from fat, 7% or less cal from saturated fats, less than 200mg  cholesterol, less than 1.5gm of sodium, & 5 or more servings of fruits and vegetables  daily;Weight Loss: Understanding of general recommendations for a balanced deficit meal plan, which promotes 1-2 lb weight loss per week and includes a negative energy balance of [phone] kcal/d;Understanding recommendations for meals to include 15-35% energy as protein, 25-35% energy from fat, 35-60% energy from carbohydrates, less than 200mg  of dietary cholesterol, 20-35 gm of total fiber daily;Understanding of distribution of calorie intake throughout the day with the consumption of 4-5 meals/snacks    Tobacco Cessation  Yes    Number of packs per day  Jordan Hebert quit 08/15/17    Intervention  Assist the participant in steps to quit. Provide individualized education and counseling about committing to Tobacco Cessation, relapse prevention, and pharmacological support that can be provided by physician.;Education officer, environmental, assist with locating and accessing local/national Quit Smoking programs, and support quit date choice.    Expected Outcomes  Short Term: Will demonstrate readiness to quit, by selecting a quit date.;Short Term: Will quit all tobacco product use, adhering to prevention of relapse plan.;Long Term: Complete abstinence from all tobacco products for at least 12 months from quit date.    Lipids  Yes    Intervention   Provide education and support for participant on nutrition & aerobic/resistive exercise along with prescribed medications to achieve LDL 70mg , HDL >40mg .    Expected Outcomes  Short Term: Participant states understanding of desired cholesterol values and is compliant with medications prescribed. Participant is following exercise prescription and nutrition guidelines.;Long Term: Cholesterol controlled with medications as prescribed, with individualized exercise RX and with personalized nutrition plan. Value goals: LDL < 70mg , HDL > 40 mg.    Stress  Yes    Intervention  Offer individual and/or small group education and counseling on adjustment to heart disease, stress management and health-related lifestyle change. Teach and support self-help strategies.;Refer participants experiencing significant psychosocial distress to appropriate mental health specialists for further evaluation and treatment. When possible, include family members and significant others in education/counseling sessions.    Expected Outcomes  Short Term: Participant demonstrates changes in health-related behavior, relaxation and other stress management skills, ability to obtain effective social support, and compliance with psychotropic medications if prescribed.;Long Term: Emotional wellbeing is indicated by absence of clinically significant psychosocial distress or social isolation.       Core Components/Risk Factors/Patient Goals Review:    Core Components/Risk Factors/Patient Goals at Discharge (Final Review):    ITP Comments: ITP Comments    Row Name 11/10/17 1354           ITP Comments  Dr. Armanda Magic, Medical Director          Comments: Jordan Hebert attended orientation from 1340 to 1446 to review rules and guidelines for program. Completed 6 minute walk test, Intitial ITP, and exercise prescription.  VSS. Telemetry-Sinus Rhythm with intermittent PVC's. Jordan Hebert stopped his walk test at 4 minutes 12 seconds due to chronic   Lower back pain. Jordan Hebert rated his back pain a 7/10 on a on a 1-10 scale. Bob's pain decreased with rest. Will fax today's ECG's tracings with PVC's to Dr Hazle Coca office for review.Gladstone Lighter, RN,BSN 11/10/2017 5:01 PM

## 2017-11-15 ENCOUNTER — Telehealth: Payer: Self-pay

## 2017-11-15 NOTE — Telephone Encounter (Signed)
Mr. Jordan Hebert's wife, Dorinda HillMelessa called concerned that her husband was told he had "skipped beats" at his Cardiac Rehab 6 minute walk test.  She stated that the employee stated that she would contact his doctor but was unsure of who she would contact.  I advised her that the Cardiologist would be contacted about the matter.  She acknowledged receipt and was thankful for the return call.  I also asked if he had any symptoms, she stated that he felt fine, even during the test.

## 2017-11-16 ENCOUNTER — Ambulatory Visit: Payer: PPO | Admitting: Cardiovascular Disease

## 2017-11-16 ENCOUNTER — Encounter (HOSPITAL_COMMUNITY): Payer: PPO

## 2017-11-16 ENCOUNTER — Encounter: Payer: Self-pay | Admitting: Cardiovascular Disease

## 2017-11-16 ENCOUNTER — Telehealth (HOSPITAL_COMMUNITY): Payer: Self-pay | Admitting: Internal Medicine

## 2017-11-16 DIAGNOSIS — I459 Conduction disorder, unspecified: Secondary | ICD-10-CM | POA: Insufficient documentation

## 2017-11-16 NOTE — Progress Notes (Signed)
Mr. 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 Maren BeachVantrigt.  He feels clinically improved.  He is scheduled to start cardiac rehab.  Apparently on a 6-minute walk test he was noted to have "skipped beats" although the strips are not available to me.  I do not think this should delay his beginning cardiac rehab.  He will be on telemetry at that time.  If there are any arrhythmias that require further attention we will deal with it at that time.  I will see him back in 6 months.  We will recheck a 2D echocardiogram.  Runell GessJonathan J. Chrystine Frogge, M.D., Roseanne RenoFACP, FACC, FAHA, FSCAI [facility] [address] [place], [place]  [id number]  [phone] 11/16/2017 11:14 AM

## 2017-11-16 NOTE — Patient Instructions (Signed)
Your physician wants you to follow-up in: 6 MONTHS WITH DR BERRY You will receive a reminder letter in the mail two months in advance. If you don't receive a letter, please call our office to schedule the follow-up appointment.   If you need a refill on your cardiac medications before your next appointment, please call your pharmacy.  

## 2017-11-16 NOTE — Assessment & Plan Note (Signed)
Mr. 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 Maren BeachVantrigt.  He feels clinically improved.  He is scheduled to start cardiac rehab.  Apparently on a 6-minute walk test he was noted to have "skipped beats" although the strips are not available to me.  I do not think this should delay his beginning cardiac rehab.  He will be on telemetry at that time.  If there are any arrhythmias that require further attention we will deal with it at that time.  I will see him back in 6 months.  We will recheck a 2D echocardiogram.

## 2017-11-18 ENCOUNTER — Encounter (HOSPITAL_COMMUNITY): Payer: PPO

## 2017-11-18 ENCOUNTER — Encounter (HOSPITAL_COMMUNITY): Payer: Self-pay

## 2017-11-18 ENCOUNTER — Encounter (HOSPITAL_COMMUNITY)
Admission: RE | Admit: 2017-11-18 | Discharge: 2017-11-18 | Disposition: A | Discharge status: Home / Self Care | Payer: PPO | Source: Ambulatory Visit | Attending: Cardiovascular Disease | Admitting: Cardiovascular Disease

## 2017-11-18 DIAGNOSIS — I214 Non-ST elevation (NSTEMI) myocardial infarction: Secondary | ICD-10-CM | POA: Diagnosis not present

## 2017-11-18 DIAGNOSIS — Z79899 Other long term (current) drug therapy: Secondary | ICD-10-CM | POA: Diagnosis not present

## 2017-11-18 DIAGNOSIS — Z79890 Hormone replacement therapy (postmenopausal): Secondary | ICD-10-CM | POA: Diagnosis not present

## 2017-11-18 DIAGNOSIS — Z952 Presence of prosthetic heart valve: Secondary | ICD-10-CM

## 2017-11-18 DIAGNOSIS — E0590 Thyrotoxicosis, unspecified without thyrotoxic crisis or storm: Secondary | ICD-10-CM | POA: Diagnosis not present

## 2017-11-18 DIAGNOSIS — E7800 Pure hypercholesterolemia, unspecified: Secondary | ICD-10-CM | POA: Diagnosis not present

## 2017-11-18 DIAGNOSIS — Z951 Presence of aortocoronary bypass graft: Secondary | ICD-10-CM

## 2017-11-18 DIAGNOSIS — Z87891 Personal history of nicotine dependence: Secondary | ICD-10-CM | POA: Diagnosis not present

## 2017-11-18 NOTE — Progress Notes (Signed)
Daily Session Note  Patient Details  Name: Jordan Hebert MRN: [id number] Date of Birth: Feb 14, 1941 Referring Provider:    Encounter Date: 11/18/2017  Check In: Session Check In - 11/18/17 [place]    Check-In          Supervising physician immediately available to respond to emergencies  [facility] immediately available    Physician(s)  Dr. Algis Liming    Location  [facility]    Staff Present  Andi Hence, RN, Toma Deiters, RN, Deland Pretty, MS, ACSM CEP, Exercise Physiologist;Amber Fair, MS, ACSM RCEP, Exercise Physiologist    Medication changes reported      No    Fall or balance concerns reported     No    Tobacco Cessation  No Change    Warm-up and Cool-down  Performed as group-led instruction    Resistance Training Performed  Yes    VAD Patient?  No    PAD/SET Patient?  No        Pain Assessment          Currently in Pain?  No/denies           Capillary Blood Glucose: No results found for this or any previous visit (from the past 24 hour(s)).    Social History   Tobacco Use  Smoking Status Former Smoker  . Types: Cigarettes  . Last attempt to quit: 08/15/2017  . Years since quitting: 0.2  Smokeless Tobacco Never Used    Goals Met:  Personal goals reviewed  Goals Unmet:  Not Applicable  Comments: Pt started cardiac rehab today.  Pt tolerated light exercise without difficulty. VSS, telemetry-sinus rhythm, asymptomatic.  Medication list reconciled. Pt denies barriers to medicaiton compliance.  PSYCHOSOCIAL ASSESSMENT:  PHQ-0. Pt exhibits positive coping skills, hopeful outlook with supportive family. No psychosocial needs identified at this time, no psychosocial interventions necessary.   Pt oriented to exercise equipment and routine.    Understanding verbalized.  Andi Hence, RN, BSN Cardiac Pulmonary Rehab     Dr. Fransico Him is Medical Director for Cardiac Rehab at [facility].

## 2017-11-21 ENCOUNTER — Encounter (HOSPITAL_COMMUNITY): Payer: PPO

## 2017-11-21 ENCOUNTER — Encounter (HOSPITAL_COMMUNITY)
Admission: RE | Admit: 2017-11-21 | Discharge: 2017-11-21 | Disposition: A | Discharge status: Home / Self Care | Payer: PPO | Source: Ambulatory Visit | Attending: Cardiovascular Disease | Admitting: Cardiovascular Disease

## 2017-11-21 DIAGNOSIS — I214 Non-ST elevation (NSTEMI) myocardial infarction: Secondary | ICD-10-CM

## 2017-11-21 DIAGNOSIS — Z951 Presence of aortocoronary bypass graft: Secondary | ICD-10-CM

## 2017-11-21 DIAGNOSIS — Z952 Presence of prosthetic heart valve: Secondary | ICD-10-CM

## 2017-11-23 ENCOUNTER — Other Ambulatory Visit: Payer: Self-pay

## 2017-11-23 ENCOUNTER — Encounter: Payer: Self-pay | Admitting: Cardiothoracic Surgery

## 2017-11-23 ENCOUNTER — Ambulatory Visit: Payer: PPO | Admitting: Cardiothoracic Surgery

## 2017-11-23 ENCOUNTER — Encounter (HOSPITAL_COMMUNITY): Payer: PPO

## 2017-11-23 VITALS — BP 100/74 | HR 104 | Resp 16 | Ht 69.5 in | Wt 207.6 lb

## 2017-11-23 DIAGNOSIS — Z951 Presence of aortocoronary bypass graft: Secondary | ICD-10-CM

## 2017-11-23 DIAGNOSIS — Z952 Presence of prosthetic heart valve: Secondary | ICD-10-CM

## 2017-11-23 NOTE — Progress Notes (Signed)
PCP is Kirby Funk, MD Referring Provider is Lennette Bihari, MD  Chief Complaint  Patient presents with  . Routine Post Op    2 month f/u s/p CABG X 4/AVR...08/17/17...in CARDIAC REHAB    HPI: Final postop visit almost 3 months after combined AVR CABG x4. Patient has done well, maintained sinus rhythm, has noticed improved exercise tolerance after AVR CABG.  He is scheduled to start cardiac rehab at [facility] soon. We discussed the importance of heart healthy diet and heart healthy lifestyle.  We discussed his long-term medication needs for his coronary disease and valve disease.  We discussed the importance of antibiotic prophylaxis for any type of invasive dental procedure and prescription for 2 g of amoxicillin provided.  Patient understands the lifting precautions are no longer needed after September 1. Past Medical History:  Diagnosis Date  . Back pain   . Colon polyps   . Coronary artery disease   . Degenerative lumbar spinal stenosis   . Depression   . Erectile dysfunction   . Gynecomastia   . High cholesterol   . Hyperthyroidism   . Rheumatic fever   . Seborrhea   . Umbilical hernia     Past Surgical History:  Procedure Laterality Date  . ABDOMINAL AORTOGRAM N/A 08/16/2017   Procedure: ABDOMINAL AORTOGRAM;  Surgeon: Lennette Bihari, MD;  Location: [facility] INVASIVE CV LAB;  Service: Cardiovascular;  Laterality: N/A;  . AORTIC VALVE REPLACEMENT N/A 08/17/2017   Procedure: AORTIC VALVE REPLACEMENT (AVR) WITH INSPIRIS AORTIC VALVE;  Surgeon: Kerin Perna, MD;  Location: [facility] OR;  Service: Open Heart Surgery;  Laterality: N/A;  . CARDIAC CATHETERIZATION    . CORONARY ANGIOGRAPHY N/A 08/16/2017   Procedure: CORONARY ANGIOGRAPHY;  Surgeon: Lennette Bihari, MD;  Location: [facility] INVASIVE CV LAB;  Service: Cardiovascular;  Laterality: N/A;  . CORONARY ARTERY BYPASS GRAFT N/A 08/17/2017   Procedure: CORONARY ARTERY BYPASS GRAFTING (CABG) x 4 , USING LEFT INTERNAL MAMMARY ARTERY  AND GREATER SAPHENOUS VEIN HARVESTED ENDOSCOPICALLY;  Surgeon: Kerin Perna, MD;  Location[facility] OR;  Service: Open Heart Surgery;  Laterality: N/A;  . CORONARY STENT PLACEMENT    . TEE WITHOUT CARDIOVERSION N/A 08/17/2017   Procedure: TRANSESOPHAGEAL ECHOCARDIOGRAM (TEE);  Surgeon: Donata Clay, Theron Arista, MD;  Location: [facility] OR;  Service: Open Heart Surgery;  Laterality: N/A;    Family History  Problem Relation Age of Onset  . Stroke Mother   . Cancer Brother   . Aneurysm Sister        Cerebral   . Other Son        Guillain-Barre Syndrome    Social History Social History   Tobacco Use  . Smoking status: Former Smoker    Types: Cigarettes    Last attempt to quit: 08/15/2017    Years since quitting: 0.2  . Smokeless tobacco: Never Used  Substance Use Topics  . Alcohol use: Yes  . Drug use: Never    Current Outpatient Medications  Medication Sig Dispense Refill  . acetaminophen (TYLENOL) 325 MG tablet Take 2 tablets (650 mg total) by mouth every 6 (six) hours as needed for mild pain.    Marland Kitchen aspirin 325 MG EC tablet Take 1 tablet (325 mg total) by mouth daily. 30 tablet 0  . atorvastatin (LIPITOR) 80 MG tablet Take 1 tablet (80 mg total) by mouth daily at 6 PM. 30 tablet 1  . cyclobenzaprine (FLEXERIL) 10 MG tablet Take 10 mg by mouth 2 (two) times  daily as needed for muscle spasms.     Marland Kitchen. levothyroxine (SYNTHROID, LEVOTHROID) 175 MCG tablet Take 175 mcg by mouth daily before breakfast.    . metoprolol tartrate (LOPRESSOR) 25 MG tablet Take 0.5 tablets (12.5 mg total) by mouth 2 (two) times daily. 60 tablet 1  . sertraline (ZOLOFT) 50 MG tablet Take 50 mg by mouth daily.    . vitamin B-12 (CYANOCOBALAMIN) 1000 MCG tablet Take 1,000 mcg by mouth daily.     No current facility-administered medications for this visit.     No Known Allergies  Review of Systems   Weight down 10 to 15 pounds Is successfully stop smoking Incisions clean and dry No swelling  BP 100/74 (BP Location:  Left Arm, Patient Position: Sitting, Cuff Size: Large)   Pulse (!) 104   Resp 16   Ht 5' 9.5" (1.765 m)   Wt 207 lb 9.6 oz (94.2 kg)   SpO2 95% Comment: ON RA  BMI 30.22 kg/m  Physical Exam      Exam    General- alert and comfortable    Neck- no JVD, no cervical adenopathy palpable, no carotid bruit   Lungs- clear without rales, wheezes   Cor- regular rate and rhythm, no murmur , gallop   Abdomen- soft, non-tender   Extremities - warm, non-tender, minimal edema   Neuro- oriented, appropriate, no focal weakness   Diagnostic Tests: None  Impression: Excellent recovery after AVR CABG  Plan: Continue current medications and follow-up with cardiologist Return as needed  Jordan BussingPeter Van Trigt III, MD [facility] [phone]

## 2017-11-25 ENCOUNTER — Encounter (HOSPITAL_COMMUNITY)
Admission: RE | Admit: 2017-11-25 | Discharge: 2017-11-25 | Disposition: A | Discharge status: Home / Self Care | Payer: PPO | Source: Ambulatory Visit | Attending: Cardiovascular Disease | Admitting: Cardiovascular Disease

## 2017-11-25 ENCOUNTER — Encounter (HOSPITAL_COMMUNITY): Payer: PPO

## 2017-11-25 DIAGNOSIS — I214 Non-ST elevation (NSTEMI) myocardial infarction: Secondary | ICD-10-CM

## 2017-11-25 DIAGNOSIS — Z951 Presence of aortocoronary bypass graft: Secondary | ICD-10-CM

## 2017-11-25 DIAGNOSIS — Z952 Presence of prosthetic heart valve: Secondary | ICD-10-CM

## 2017-11-30 ENCOUNTER — Encounter (HOSPITAL_COMMUNITY): Payer: PPO

## 2017-11-30 DIAGNOSIS — M25572 Pain in left ankle and joints of left foot: Secondary | ICD-10-CM | POA: Diagnosis not present

## 2017-12-01 ENCOUNTER — Encounter (HOSPITAL_COMMUNITY): Payer: Self-pay | Admitting: *Deleted

## 2017-12-01 DIAGNOSIS — Z952 Presence of prosthetic heart valve: Secondary | ICD-10-CM

## 2017-12-01 DIAGNOSIS — Z951 Presence of aortocoronary bypass graft: Secondary | ICD-10-CM

## 2017-12-01 DIAGNOSIS — I214 Non-ST elevation (NSTEMI) myocardial infarction: Secondary | ICD-10-CM

## 2017-12-01 NOTE — Progress Notes (Signed)
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  acetaminophen (TYLENOL) 325 MG tablet, Take 2 tablets (650 mg total) by mouth every 6 (six) hours as needed for mild pain., Disp: , Rfl:  .  aspirin 325 MG EC tablet, Take 1 tablet (325 mg total) by mouth daily., Disp: 30 tablet, Rfl: 0 .  atorvastatin (LIPITOR) 80 MG tablet, Take 1 tablet (80 mg total) by mouth daily at 6 PM., Disp: 30 tablet, Rfl: 1 .  cyclobenzaprine (FLEXERIL) 10 MG tablet, Take 10 mg by mouth 2 (two) times daily as needed for muscle spasms. , Disp: , Rfl:  .  levothyroxine (SYNTHROID, LEVOTHROID) 175 MCG tablet, Take 175 mcg by mouth daily before breakfast., Disp: , Rfl:  .  metoprolol tartrate (LOPRESSOR) 25 MG tablet, Take 0.5 tablets (12.5 mg total) by mouth 2 (two) times daily., Disp: 60 tablet, Rfl: 1 .  sertraline (ZOLOFT) 50 MG tablet, Take 50 mg by mouth daily., Disp: , Rfl:  .  vitamin B-12 (CYANOCOBALAMIN) 1000 MCG tablet, Take 1,000 mcg by mouth daily., Disp: , Rfl:   Past Medical History: Past Medical History:  Diagnosis Date  . Back pain   . Colon polyps   . Coronary artery disease   . Degenerative lumbar spinal stenosis   . Depression   . Erectile dysfunction   . Gynecomastia   . High cholesterol   . Hyperthyroidism   . Rheumatic fever   . Seborrhea   . Umbilical hernia     Tobacco Use: Social History   Tobacco Use  Smoking Status Former Smoker  . Types: Cigarettes  . Last attempt to quit: 08/15/2017  . Years since quitting: 0.2  Smokeless Tobacco Never  Used    Labs: Recent Review Advice worker    Labs for ITP Cardiac and Pulmonary Rehab Latest Ref Rng & Units 08/19/2017 08/19/2017 08/19/2017 08/20/2017 08/21/2017   Cholestrol 0 - 200 mg/dL - - - - -   LDLCALC 0 - 99 mg/dL - - - - -   HDL >40 mg/dL - - - - -   Trlycerides <150 mg/dL - - - - -   Hemoglobin A1c 4.8 - 5.6 % - - - - -   PHART 7.350 - 7.450 7.338(L) - - - -   PCO2ART 32.0 - 48.0 mmHg 54.1(H) - - - -   HCO3 20.0 - 28.0 mmol/L 29.1(H) - - - -   TCO2 22 - 32 mmol/L 31 28 - 30 30   ACIDBASEDEF 0.0 - 2.0 mmol/L - - - - -   O2SAT % 94.0 - 66.2 74.6 58.9      Capillary Blood Glucose: Lab Results  Component Value Date   GLUCAP 99 08/21/2017   GLUCAP 121 (H) 08/21/2017   GLUCAP 116 (H) 08/21/2017   GLUCAP 112 (H) 08/20/2017   GLUCAP 131 (H) 08/20/2017     Exercise Target Goals: Exercise Program Goal: Individual exercise prescription set using results from initial 6 min walk test and THRR while considering  patient's activity barriers and safety.   Exercise  Prescription Goal: Initial exercise prescription builds to 30-45 minutes a day of aerobic activity, 2-3 days per week.  Home exercise guidelines will be given to patient during program as part of exercise prescription that the participant will acknowledge.  Activity Barriers & Risk Stratification: Activity Barriers & Cardiac Risk Stratification - 11/10/17 1439      Activity Barriers & Cardiac Risk Stratification   Activity Barriers  Arthritis;Back Problems;Joint Problems;Deconditioning;Muscular Weakness    Cardiac Risk Stratification  High       6 Minute Walk: 6 Minute Walk    Row Name 11/10/17 1427         6 Minute Walk   Phase  Initial     Distance  800 feet     Walk Time  4.12 minutes     # of Rest Breaks  0     MPH  2.2     METS  1.4     RPE  11     VO2 Peak  4.9     Symptoms  Yes (comment)     Comments  stopped walk test 4.12 due to back pain 7/10. Pt subsided with rest     Resting HR  72 bpm      Resting BP  106/70     Resting Oxygen Saturation   95 %     Exercise Oxygen Saturation  during 6 min walk  97 %     Max Ex. HR  101 bpm     Max Ex. BP  116/80        Oxygen Initial Assessment:   Oxygen Re-Evaluation:   Oxygen Discharge (Final Oxygen Re-Evaluation):   Initial Exercise Prescription: Initial Exercise Prescription - 11/10/17 1400      Date of Initial Exercise RX and Referring Provider   Date  11/10/17      Recumbant Bike   Level  1.5    Minutes  10    METs  1.5      NuStep   Level  2    SPM  70    Minutes  10    METs  1.5      Arm Ergometer   Level  2    Minutes  10    METs  1.5      Prescription Details   Frequency (times per week)  3    Duration  Progress to 30 minutes of continuous aerobic without signs/symptoms of physical distress      Intensity   THRR 40-80% of Max Heartrate  58-115    Ratings of Perceived Exertion  11-13    Perceived Dyspnea  0-4      Progression   Progression  Continue to progress workloads to maintain intensity without signs/symptoms of physical distress.      Resistance Training   Training Prescription  Yes    Weight  2lbs    Reps  10-15       Perform Capillary Blood Glucose checks as needed.  Exercise Prescription Changes: Exercise Prescription Changes    Row Name 11/18/17 1458 11/21/17 1454           Response to Exercise   Blood Pressure (Admit)  118/70  122/88      Blood Pressure (Exercise)  140/78  138/78      Blood Pressure (Exit)  112/78  108/70      Heart Rate (Admit)  97 bpm  95 bpm      Heart Rate (Exercise)  120 bpm  114 bpm      Heart Rate (Exit)  98 bpm  89 bpm      Rating of Perceived Exertion (Exercise)  12  12      Symptoms  none  none      Duration  Progress to 30 minutes of  aerobic without signs/symptoms of physical distress  Progress to 30 minutes of  aerobic without signs/symptoms of physical distress      Intensity  THRR unchanged  THRR unchanged        Progression    Progression  Continue to progress workloads to maintain intensity without signs/symptoms of physical distress.  Continue to progress workloads to maintain intensity without signs/symptoms of physical distress.      Average METs  2.1  2.1        Resistance Training   Training Prescription  Yes  Yes      Weight  2lbs  2lbs      Reps  10-15  10-15      Time  10 Minutes  10 Minutes        Interval Training   Interval Training  No  No        Recumbant Bike   Level  1.5  1.5      Minutes  10  10      METs  1.9  2        NuStep   Level  2  2      SPM  70  70      Minutes  10  10      METs  2.2  2.1        Arm Ergometer   Level  2  2      Minutes  10  10         Exercise Comments: Exercise Comments    Row Name 11/18/17 1557 11/21/17 1524         Exercise Comments  Patient tolerated exercise at low intensity without c/o.  Reviewed METs and goals with patient.         Exercise Goals and Review: Exercise Goals    Row Name 11/10/17 1354 11/10/17 1420 11/10/17 1439         Exercise Goals   Increase Physical Activity  Yes  -  -     Intervention  Provide advice, education, support and counseling about physical activity/exercise needs.;Develop an individualized exercise prescription for aerobic and resistive training based on initial evaluation findings, risk stratification, comorbidities and participant's personal goals.  -  -     Expected Outcomes  Short Term: Attend rehab on a regular basis to increase amount of physical activity.;Long Term: Add in home exercise to make exercise part of routine and to increase amount of physical activity.;Long Term: Exercising regularly at least 3-5 days a week.  -  -     Increase Strength and Stamina  Yes  - increase muscle tone, functional mobility, balance and overall endurance  Yes     Intervention  Provide advice, education, support and counseling about physical activity/exercise needs.;Develop an individualized exercise prescription for  aerobic and resistive training based on initial evaluation findings, risk stratification, comorbidities and participant's personal goals.  -  -     Expected Outcomes  Short Term: Increase workloads from initial exercise prescription for resistance, speed, and METs.;Short Term: Perform resistance training exercises routinely during rehab and add in resistance training at home;Long Term: Improve cardiorespiratory fitness, muscular endurance and strength  as measured by increased METs and functional capacity ( )  -  -     Able to understand and use rate of perceived exertion (RPE) scale  Yes  -  -     Intervention  Provide education and explanation on how to use RPE scale  -  -     Expected Outcomes  Short Term: Able to use RPE daily in rehab to express subjective intensity level;Long Term:  Able to use RPE to guide intensity level when exercising independently  -  -     Knowledge and understanding of Target Heart Rate Range (THRR)  Yes  -  -     Intervention  Provide education and explanation of THRR including how the numbers were predicted and where they are located for reference  -  -     Expected Outcomes  Short Term: Able to state/look up THRR;Long Term: Able to use THRR to govern intensity when exercising independently;Short Term: Able to use daily as guideline for intensity in rehab  -  -     Able to check pulse independently  Yes  -  -     Intervention  Provide education and demonstration on how to check pulse in carotid and radial arteries.;Review the importance of being able to check your own pulse for safety during independent exercise  -  -     Expected Outcomes  Short Term: Able to explain why pulse checking is important during independent exercise;Long Term: Able to check pulse independently and accurately  -  -     Understanding of Exercise Prescription  Yes  -  -     Intervention  Provide education, explanation, and written materials on patient's individual exercise prescription  -  -      Expected Outcomes  Short Term: Able to explain program exercise prescription;Long Term: Able to explain home exercise prescription to exercise independently  -  -        Exercise Goals Re-Evaluation : Exercise Goals Re-Evaluation    Row Name 11/18/17 1557 11/21/17 1524           Exercise Goal Re-Evaluation   Exercise Goals Review  Able to understand and use rate of perceived exertion (RPE) scale  Increase Physical Activity      Comments  Patient able to understand and use RPE scale appropriately.  Patient is off to a good start with exercise. Tolerated low-moderate intensity exercise at cardiac rehab. Pt states he's walking 15 minutes, 4-5 days/week.      Expected Outcomes  Increase workloads as tolerated to help achieve personal health and fitness goals.  Increase workloads as tolerated to help achieve personal health and fitness goals.         Discharge Exercise Prescription (Final Exercise Prescription Changes): Exercise Prescription Changes - 11/21/17 1454      Response to Exercise   Blood Pressure (Admit)  122/88    Blood Pressure (Exercise)  138/78    Blood Pressure (Exit)  108/70    Heart Rate (Admit)  95 bpm    Heart Rate (Exercise)  114 bpm    Heart Rate (Exit)  89 bpm    Rating of Perceived Exertion (Exercise)  12    Symptoms  none    Duration  Progress to 30 minutes of  aerobic without signs/symptoms of physical distress    Intensity  THRR unchanged      Progression   Progression  Continue to progress workloads to maintain intensity without signs/symptoms  of physical distress.    Average METs  2.1      Resistance Training   Training Prescription  Yes    Weight  2lbs    Reps  10-15    Time  10 Minutes      Interval Training   Interval Training  No      Recumbant Bike   Level  1.5    Minutes  10    METs  2      NuStep   Level  2    SPM  70    Minutes  10    METs  2.1      Arm Ergometer   Level  2    Minutes  10       Nutrition:  Target Goals:  Understanding of nutrition guidelines, daily intake of sodium 1500mg , cholesterol 200mg , calories 30% from fat and 7% or less from saturated fats, daily to have 5 or more servings of fruits and vegetables.  Biometrics: Pre Biometrics - 11/10/17 1440      Pre Biometrics   Height  5' 8.75" (1.746 m)    Weight  210 lb 12.2 oz (95.6 kg)    Waist Circumference  44.5 inches    Hip Circumference  43.5 inches    Waist to Hip Ratio  1.02 %    BMI (Calculated)  31.36    Triceps Skinfold  18 mm    % Body Fat  32 %    Grip Strength  33 kg    Flexibility  --   LBP!   Single Leg Stand  0 seconds        Nutrition Therapy Plan and Nutrition Goals:   Nutrition Assessments:   Nutrition Goals Re-Evaluation:   Nutrition Goals Re-Evaluation:   Nutrition Goals Discharge (Final Nutrition Goals Re-Evaluation):   Psychosocial: Target Goals: Acknowledge presence or absence of significant depression and/or stress, maximize coping skills, provide positive support system. Participant is able to verbalize types and ability to use techniques and skills needed for reducing stress and depression.  Initial Review & Psychosocial Screening: Initial Psych Review & Screening - 11/10/17 1650      Initial Review   Current issues with  None Identified      Family Dynamics   Good Support System?  Yes   Jordan Hebert has his wife daughter's and granddaughter's for support     Barriers   Psychosocial barriers to participate in program  There are no identifiable barriers or psychosocial needs.      Screening Interventions   Interventions  Encouraged to exercise       Quality of Life Scores: Quality of Life - 11/10/17 1453      Quality of Life   Select  Quality of Life      Quality of Life Scores   Health/Function Pre  25.37 %    Socioeconomic Pre  28.33 %    Psych/Spiritual Pre  27.43 %    Family Pre  21.5 %    GLOBAL Pre  25.89 %      Scores of 19 and below usually indicate a poorer quality of  life in these areas.  A difference of  2-3 points is a clinically meaningful difference.  A difference of 2-3 points in the total score of the Quality of Life Index has been associated with significant improvement in overall quality of life, self-image, physical symptoms, and general health in studies assessing change in quality of life.  PHQ-9: Recent  Review Flowsheet Data    There is no flowsheet data to display.     Interpretation of Total Score  Total Score Depression Severity:  1-4 = Minimal depression, 5-9 = Mild depression, 10-14 = Moderate depression, 15-19 = Moderately severe depression, 20-27 = Severe depression   Psychosocial Evaluation and Intervention: Psychosocial Evaluation - 11/18/17 1543      Psychosocial Evaluation & Interventions   Interventions  Encouraged to exercise with the program and follow exercise prescription    Comments  no psychosocial needs identified, no interventions necessary. pt enjoys "everything" admits he looks for ways to find pleasure in activities.    Expected Outcomes  pt will exhibit positive outlook with good outlook    Continue Psychosocial Services   No Follow up required       Psychosocial Re-Evaluation: Psychosocial Re-Evaluation    Row Name 12/01/17 1157             Psychosocial Re-Evaluation   Interventions  Encouraged to attend Cardiac Rehabilitation for the exercise       Continue Psychosocial Services   No Follow up required          Psychosocial Discharge (Final Psychosocial Re-Evaluation): Psychosocial Re-Evaluation - 12/01/17 1157      Psychosocial Re-Evaluation   Interventions  Encouraged to attend Cardiac Rehabilitation for the exercise    Continue Psychosocial Services   No Follow up required       Vocational Rehabilitation: Provide vocational rehab assistance to qualifying candidates.   Vocational Rehab Evaluation & Intervention: Vocational Rehab - 11/10/17 1652      Initial Vocational Rehab Evaluation &  Intervention   Assessment shows need for Vocational Rehabilitation  No   Jordan Hebert is retired and does not need vocational rehab at this time      Education: Education Goals: Education classes will be provided on a weekly basis, covering required topics. Participant will state understanding/return demonstration of topics presented.  Learning Barriers/Preferences: Learning Barriers/Preferences - 11/10/17 1356      Learning Barriers/Preferences   Learning Barriers  Sight;Hearing    Learning Preferences  Written Material;Video       Education Topics: Count Your Pulse:  -Group instruction provided by verbal instruction, demonstration, patient participation and written materials to support subject.  Instructors address importance of being able to find your pulse and how to count your pulse when at home without a heart monitor.  Patients get hands on experience counting their pulse with staff help and individually.   Heart Attack, Angina, and Risk Factor Modification:  -Group instruction provided by verbal instruction, video, and written materials to support subject.  Instructors address signs and symptoms of angina and heart attacks.    Also discuss risk factors for heart disease and how to make changes to improve heart health risk factors.   Functional Fitness:  -Group instruction provided by verbal instruction, demonstration, patient participation, and written materials to support subject.  Instructors address safety measures for doing things around the house.  Discuss how to get up and down off the floor, how to pick things up properly, how to safely get out of a chair without assistance, and balance training.   Meditation and Mindfulness:  -Group instruction provided by verbal instruction, patient participation, and written materials to support subject.  Instructor addresses importance of mindfulness and meditation practice to help reduce stress and improve awareness.  Instructor also leads  participants through a meditation exercise.    Stretching for Flexibility and Mobility:  -Group instruction provided  by verbal instruction, patient participation, and written materials to support subject.  Instructors lead participants through series of stretches that are designed to increase flexibility thus improving mobility.  These stretches are additional exercise for major muscle groups that are typically performed during regular warm up and cool down.   Hands Only CPR:  -Group verbal, video, and participation provides a basic overview of AHA guidelines for community CPR. Role-play of emergencies allow participants the opportunity to practice calling for help and chest compression technique with discussion of AED use.   Hypertension: -Group verbal and written instruction that provides a basic overview of hypertension including the most recent diagnostic guidelines, risk factor reduction with self-care instructions and medication management.    Nutrition I class: Heart Healthy Eating:  -Group instruction provided by PowerPoint slides, verbal discussion, and written materials to support subject matter. The instructor gives an explanation and review of the Therapeutic Lifestyle Changes diet recommendations, which includes a discussion on lipid goals, dietary fat, sodium, fiber, plant stanol/sterol esters, sugar, and the components of a well-balanced, healthy diet.   Nutrition II class: Lifestyle Skills:  -Group instruction provided by PowerPoint slides, verbal discussion, and written materials to support subject matter. The instructor gives an explanation and review of label reading, grocery shopping for heart health, heart healthy recipe modifications, and ways to make healthier choices when eating out.   Diabetes Question & Answer:  -Group instruction provided by PowerPoint slides, verbal discussion, and written materials to support subject matter. The instructor gives an explanation  and review of diabetes co-morbidities, pre- and post-prandial blood glucose goals, pre-exercise blood glucose goals, signs, symptoms, and treatment of hypoglycemia and hyperglycemia, and foot care basics.   Diabetes Blitz:  -Group instruction provided by PowerPoint slides, verbal discussion, and written materials to support subject matter. The instructor gives an explanation and review of the physiology behind type 1 and type 2 diabetes, diabetes medications and rational behind using different medications, pre- and post-prandial blood glucose recommendations and Hemoglobin A1c goals, diabetes diet, and exercise including blood glucose guidelines for exercising safely.    Portion Distortion:  -Group instruction provided by PowerPoint slides, verbal discussion, written materials, and food models to support subject matter. The instructor gives an explanation of serving size versus portion size, changes in portions sizes over the last 20 years, and what consists of a serving from each food group.   Stress Management:  -Group instruction provided by verbal instruction, video, and written materials to support subject matter.  Instructors review role of stress in heart disease and how to cope with stress positively.     Exercising on Your Own:  -Group instruction provided by verbal instruction, power point, and written materials to support subject.  Instructors discuss benefits of exercise, components of exercise, frequency and intensity of exercise, and end points for exercise.  Also discuss use of nitroglycerin and activating EMS.  Review options of places to exercise outside of rehab.  Review guidelines for sex with heart disease.   Cardiac Drugs I:  -Group instruction provided by verbal instruction and written materials to support subject.  Instructor reviews cardiac drug classes: antiplatelets, anticoagulants, beta blockers, and statins.  Instructor discusses reasons, side effects, and lifestyle  considerations for each drug class.   Cardiac Drugs II:  -Group instruction provided by verbal instruction and written materials to support subject.  Instructor reviews cardiac drug classes: angiotensin converting enzyme inhibitors (ACE-I), angiotensin II receptor blockers (ARBs), nitrates, and calcium channel blockers.  Instructor discusses reasons, side  effects, and lifestyle considerations for each drug class.   Anatomy and Physiology of the Circulatory System:  Group verbal and written instruction and models provide basic cardiac anatomy and physiology, with the coronary electrical and arterial systems. Review of: AMI, Angina, Valve disease, Heart Failure, Peripheral Artery Disease, Cardiac Arrhythmia, Pacemakers, and the ICD.   Other Education:  -Group or individual verbal, written, or video instructions that support the educational goals of the cardiac rehab program.   Holiday Eating Survival Tips:  -Group instruction provided by PowerPoint slides, verbal discussion, and written materials to support subject matter. The instructor gives patients tips, tricks, and techniques to help them not only survive but enjoy the holidays despite the onslaught of food that accompanies the holidays.   Knowledge Questionnaire Score: Knowledge Questionnaire Score - 11/10/17 1442      Knowledge Questionnaire Score   Pre Score  20/24       Core Components/Risk Factors/Patient Goals at Admission: Personal Goals and Risk Factors at Admission - 11/10/17 1652      Core Components/Risk Factors/Patient Goals on Admission    Weight Management  Yes;Obesity    Intervention  Weight Management: Develop a combined nutrition and exercise program designed to reach desired caloric intake, while maintaining appropriate intake of nutrient and fiber, sodium and fats, and appropriate energy expenditure required for the weight goal.;Weight Management: Provide education and appropriate resources to help participant  work on and attain dietary goals.;Weight Management/Obesity: Establish reasonable short term and long term weight goals.;Obesity: Provide education and appropriate resources to help participant work on and attain dietary goals.    Admit Weight  210 lb 12.2 oz (95.6 kg)    Goal Weight: Short Term  200 lb (90.7 kg)    Goal Weight: Long Term  190 lb (86.2 kg)    Expected Outcomes  Short Term: Continue to assess and modify interventions until short term weight is achieved;Long Term: Adherence to nutrition and physical activity/exercise program aimed toward attainment of established weight goal;Weight Maintenance: Understanding of the daily nutrition guidelines, which includes 25-35% calories from fat, 7% or less cal from saturated fats, less than 200mg  cholesterol, less than 1.5gm of sodium, & 5 or more servings of fruits and vegetables daily;Weight Loss: Understanding of general recommendations for a balanced deficit meal plan, which promotes 1-2 lb weight loss per week and includes a negative energy balance of [phone] kcal/d;Understanding recommendations for meals to include 15-35% energy as protein, 25-35% energy from fat, 35-60% energy from carbohydrates, less than 200mg  of dietary cholesterol, 20-35 gm of total fiber daily;Understanding of distribution of calorie intake throughout the day with the consumption of 4-5 meals/snacks    Tobacco Cessation  Yes    Number of packs per day  Jordan Hebert quit 08/15/17    Intervention  Assist the participant in steps to quit. Provide individualized education and counseling about committing to Tobacco Cessation, relapse prevention, and pharmacological support that can be provided by physician.;Education officer, environmental, assist with locating and accessing local/national Quit Smoking programs, and support quit date choice.    Expected Outcomes  Short Term: Will demonstrate readiness to quit, by selecting a quit date.;Short Term: Will quit all tobacco product use, adhering to  prevention of relapse plan.;Long Term: Complete abstinence from all tobacco products for at least 12 months from quit date.    Lipids  Yes    Intervention  Provide education and support for participant on nutrition & aerobic/resistive exercise along with prescribed medications to achieve LDL 70mg , HDL >40mg .  Expected Outcomes  Short Term: Participant states understanding of desired cholesterol values and is compliant with medications prescribed. Participant is following exercise prescription and nutrition guidelines.;Long Term: Cholesterol controlled with medications as prescribed, with individualized exercise RX and with personalized nutrition plan. Value goals: LDL < 70mg , HDL > 40 mg.    Stress  Yes    Intervention  Offer individual and/or small group education and counseling on adjustment to heart disease, stress management and health-related lifestyle change. Teach and support self-help strategies.;Refer participants experiencing significant psychosocial distress to appropriate mental health specialists for further evaluation and treatment. When possible, include family members and significant others in education/counseling sessions.    Expected Outcomes  Short Term: Participant demonstrates changes in health-related behavior, relaxation and other stress management skills, ability to obtain effective social support, and compliance with psychotropic medications if prescribed.;Long Term: Emotional wellbeing is indicated by absence of clinically significant psychosocial distress or social isolation.       Core Components/Risk Factors/Patient Goals Review:  Goals and Risk Factor Review    Row Name 11/18/17 1545 12/01/17 1158           Core Components/Risk Factors/Patient Goals Review   Personal Goals Review  Weight Management/Obesity;Tobacco Cessation;Stress;Lipids  Weight Management/Obesity;Tobacco Cessation;Stress;Lipids      Review  pt with multiple CAD RF demonstrates eagerness to  participate in CR activities.  pt encouraged to engage in all CR aspects for full program benefits. pt denies difficulty with tiobacco cessation and congratulated on his success.  pt personal goals are to increase strength, stamina and muscle tone.    pt with multiple CAD RF demonstrates eagerness to participate in CR activities.  pt encouraged to engage in all CR aspects for full program benefits. pt denies difficulty with tiobacco cessation and congratulated on his success.  pt personal goals are to increase strength, stamina and muscle tone.        Expected Outcomes  pt will participate in CR exercise, nutrition and lifestyle modification opportunities.   pt will participate in CR exercise, nutrition and lifestyle modification opportunities.          Core Components/Risk Factors/Patient Goals at Discharge (Final Review):  Goals and Risk Factor Review - 12/01/17 1158      Core Components/Risk Factors/Patient Goals Review   Personal Goals Review  Weight Management/Obesity;Tobacco Cessation;Stress;Lipids    Review  pt with multiple CAD RF demonstrates eagerness to participate in CR activities.  pt encouraged to engage in all CR aspects for full program benefits. pt denies difficulty with tiobacco cessation and congratulated on his success.  pt personal goals are to increase strength, stamina and muscle tone.      Expected Outcomes  pt will participate in CR exercise, nutrition and lifestyle modification opportunities.        ITP Comments: ITP Comments    Row Name 11/10/17 1354 11/18/17 1547 12/01/17 1155       ITP Comments  Dr. Armanda Magic, Medical Director  pt started group exercise program. pt oriented to exercise equipment and safety routine. pt demostrates eagerness to participate in CR program.    30 Day ITP Review. Jordan Hebert is with fair attendance and participation in phase 2 cardiac rehab        Comments: See ITP comments. Jordan Hebert is off to a good start to exercise.Gladstone Lighter,  RN,BSN 12/01/2017 12:00 PM

## 2017-12-02 ENCOUNTER — Encounter (HOSPITAL_COMMUNITY): Payer: PPO

## 2017-12-05 ENCOUNTER — Encounter (HOSPITAL_COMMUNITY): Payer: PPO

## 2017-12-07 ENCOUNTER — Telehealth (HOSPITAL_COMMUNITY): Payer: Self-pay | Admitting: *Deleted

## 2017-12-07 ENCOUNTER — Encounter (HOSPITAL_COMMUNITY): Payer: PPO

## 2017-12-07 ENCOUNTER — Encounter (HOSPITAL_COMMUNITY): Admission: RE | Admit: 2017-12-07 | Payer: PPO | Source: Ambulatory Visit

## 2017-12-09 ENCOUNTER — Encounter (HOSPITAL_COMMUNITY): Payer: PPO

## 2017-12-12 ENCOUNTER — Encounter (HOSPITAL_COMMUNITY): Payer: PPO

## 2017-12-12 ENCOUNTER — Encounter (HOSPITAL_COMMUNITY): Admission: RE | Admit: 2017-12-12 | Payer: PPO | Source: Ambulatory Visit

## 2017-12-13 ENCOUNTER — Telehealth (HOSPITAL_COMMUNITY): Payer: Self-pay | Admitting: *Deleted

## 2017-12-14 ENCOUNTER — Other Ambulatory Visit: Payer: Self-pay

## 2017-12-14 ENCOUNTER — Encounter (HOSPITAL_COMMUNITY): Payer: PPO

## 2017-12-14 ENCOUNTER — Ambulatory Visit (HOSPITAL_COMMUNITY): Payer: PPO | Attending: Cardiovascular Disease

## 2017-12-14 DIAGNOSIS — E785 Hyperlipidemia, unspecified: Secondary | ICD-10-CM | POA: Insufficient documentation

## 2017-12-14 DIAGNOSIS — Z951 Presence of aortocoronary bypass graft: Secondary | ICD-10-CM | POA: Diagnosis not present

## 2017-12-14 DIAGNOSIS — I517 Cardiomegaly: Secondary | ICD-10-CM | POA: Insufficient documentation

## 2017-12-14 DIAGNOSIS — I350 Nonrheumatic aortic (valve) stenosis: Secondary | ICD-10-CM | POA: Diagnosis not present

## 2017-12-14 DIAGNOSIS — Z87891 Personal history of nicotine dependence: Secondary | ICD-10-CM | POA: Insufficient documentation

## 2017-12-14 DIAGNOSIS — Z953 Presence of xenogenic heart valve: Secondary | ICD-10-CM | POA: Insufficient documentation

## 2017-12-14 DIAGNOSIS — I340 Nonrheumatic mitral (valve) insufficiency: Secondary | ICD-10-CM | POA: Insufficient documentation

## 2017-12-14 DIAGNOSIS — I252 Old myocardial infarction: Secondary | ICD-10-CM | POA: Diagnosis not present

## 2017-12-15 ENCOUNTER — Other Ambulatory Visit: Payer: Self-pay | Admitting: *Deleted

## 2017-12-15 DIAGNOSIS — Z952 Presence of prosthetic heart valve: Secondary | ICD-10-CM

## 2017-12-15 DIAGNOSIS — I350 Nonrheumatic aortic (valve) stenosis: Secondary | ICD-10-CM

## 2017-12-16 ENCOUNTER — Encounter (HOSPITAL_COMMUNITY): Payer: PPO

## 2017-12-19 ENCOUNTER — Encounter (HOSPITAL_COMMUNITY): Payer: PPO

## 2017-12-19 ENCOUNTER — Telehealth (HOSPITAL_COMMUNITY): Payer: Self-pay | Admitting: *Deleted

## 2017-12-20 DIAGNOSIS — S93492A Sprain of other ligament of left ankle, initial encounter: Secondary | ICD-10-CM | POA: Diagnosis not present

## 2017-12-21 ENCOUNTER — Encounter (HOSPITAL_COMMUNITY): Payer: PPO

## 2017-12-21 ENCOUNTER — Telehealth (HOSPITAL_COMMUNITY): Payer: Self-pay | Admitting: Internal Medicine

## 2017-12-23 ENCOUNTER — Encounter (HOSPITAL_COMMUNITY): Payer: PPO

## 2017-12-26 ENCOUNTER — Encounter (HOSPITAL_COMMUNITY): Payer: PPO

## 2017-12-26 ENCOUNTER — Encounter (HOSPITAL_COMMUNITY)
Admission: RE | Admit: 2017-12-26 | Discharge: 2017-12-26 | Disposition: A | Discharge status: Home / Self Care | Payer: PPO | Source: Ambulatory Visit | Attending: Cardiovascular Disease | Admitting: Cardiovascular Disease

## 2017-12-26 DIAGNOSIS — Z951 Presence of aortocoronary bypass graft: Secondary | ICD-10-CM | POA: Insufficient documentation

## 2017-12-26 DIAGNOSIS — Z952 Presence of prosthetic heart valve: Secondary | ICD-10-CM

## 2017-12-26 DIAGNOSIS — E0590 Thyrotoxicosis, unspecified without thyrotoxic crisis or storm: Secondary | ICD-10-CM | POA: Diagnosis not present

## 2017-12-26 DIAGNOSIS — Z87891 Personal history of nicotine dependence: Secondary | ICD-10-CM | POA: Insufficient documentation

## 2017-12-26 DIAGNOSIS — Z79899 Other long term (current) drug therapy: Secondary | ICD-10-CM | POA: Diagnosis not present

## 2017-12-26 DIAGNOSIS — I214 Non-ST elevation (NSTEMI) myocardial infarction: Secondary | ICD-10-CM | POA: Diagnosis not present

## 2017-12-26 DIAGNOSIS — E7800 Pure hypercholesterolemia, unspecified: Secondary | ICD-10-CM | POA: Insufficient documentation

## 2017-12-26 DIAGNOSIS — Z79890 Hormone replacement therapy (postmenopausal): Secondary | ICD-10-CM | POA: Insufficient documentation

## 2017-12-26 NOTE — Progress Notes (Signed)
Jordan Hebert returned to exercise per Dr Janeece Fitting office and exercised today without difficulty. VSS. Will continue to monitor the patient throughout  the program.Maria Harlon Flor, RN,BSN 12/26/2017 4:57 PM

## 2017-12-28 ENCOUNTER — Encounter (HOSPITAL_COMMUNITY)
Admission: RE | Admit: 2017-12-28 | Discharge: 2017-12-28 | Disposition: A | Discharge status: Home / Self Care | Payer: PPO | Source: Ambulatory Visit | Attending: Cardiovascular Disease | Admitting: Cardiovascular Disease

## 2017-12-28 ENCOUNTER — Encounter (HOSPITAL_COMMUNITY): Payer: PPO

## 2017-12-28 DIAGNOSIS — Z87891 Personal history of nicotine dependence: Secondary | ICD-10-CM | POA: Insufficient documentation

## 2017-12-28 DIAGNOSIS — E0590 Thyrotoxicosis, unspecified without thyrotoxic crisis or storm: Secondary | ICD-10-CM | POA: Insufficient documentation

## 2017-12-28 DIAGNOSIS — Z952 Presence of prosthetic heart valve: Secondary | ICD-10-CM

## 2017-12-28 DIAGNOSIS — I214 Non-ST elevation (NSTEMI) myocardial infarction: Secondary | ICD-10-CM

## 2017-12-28 DIAGNOSIS — Z951 Presence of aortocoronary bypass graft: Secondary | ICD-10-CM | POA: Diagnosis not present

## 2017-12-28 DIAGNOSIS — Z79890 Hormone replacement therapy (postmenopausal): Secondary | ICD-10-CM | POA: Diagnosis not present

## 2017-12-28 DIAGNOSIS — Z79899 Other long term (current) drug therapy: Secondary | ICD-10-CM | POA: Diagnosis not present

## 2017-12-28 DIAGNOSIS — E7800 Pure hypercholesterolemia, unspecified: Secondary | ICD-10-CM | POA: Insufficient documentation

## 2017-12-28 NOTE — Progress Notes (Signed)
Reviewed home exercise guidelines with patient including endpoints, temperature precautions, target heart rate and rate of perceived exertion. Pt is currently walking 13-14 minutes 3 days/week as his mode of home exercise. Discussed increasing walking duration at home to achieve 30 minutes, and pt is amenable to this. Pt will walk 15 minutes 2x's/day, 3 days/week. Pt voices understanding of instructions given. Artist Pais, MS, ACSM CEP

## 2017-12-29 NOTE — Progress Notes (Signed)
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  acetaminophen (TYLENOL) 325 MG tablet, Take 2 tablets (650 mg total) by mouth every 6 (six) hours as needed for mild pain., Disp: , Rfl:  .  aspirin 325 MG EC tablet, Take 1 tablet (325 mg total) by mouth daily., Disp: 30 tablet, Rfl: 0 .  atorvastatin (LIPITOR) 80 MG tablet, Take 1 tablet (80 mg total) by mouth daily at 6 PM., Disp: 30 tablet, Rfl: 1 .  cyclobenzaprine (FLEXERIL) 10 MG tablet, Take 10 mg by mouth 2 (two) times daily as needed for muscle spasms. , Disp: , Rfl:  .  levothyroxine (SYNTHROID, LEVOTHROID) 175 MCG tablet, Take 175 mcg by mouth daily before breakfast., Disp: , Rfl:  .  metoprolol tartrate (LOPRESSOR) 25 MG tablet, Take 0.5 tablets (12.5 mg total) by mouth 2 (two) times daily., Disp: 60 tablet, Rfl: 1 .  sertraline (ZOLOFT) 50 MG tablet, Take 50 mg by mouth daily., Disp: , Rfl:  .  vitamin B-12 (CYANOCOBALAMIN) 1000 MCG tablet, Take 1,000 mcg by mouth daily., Disp: , Rfl:   Past Medical History: Past Medical History:  Diagnosis Date  . Back pain   . Colon polyps   . Coronary artery disease   . Degenerative lumbar spinal stenosis   . Depression   . Erectile dysfunction   . Gynecomastia   . High cholesterol   . Hyperthyroidism   . Rheumatic fever   . Seborrhea   . Umbilical hernia     Tobacco Use: Social History   Tobacco Use  Smoking Status Former Smoker  . Types: Cigarettes  . Last attempt to quit: 08/15/2017  . Years since quitting: 0.3  Smokeless Tobacco Never  Used    Labs: Recent Review Advice worker    Labs for ITP Cardiac and Pulmonary Rehab Latest Ref Rng & Units 08/19/2017 08/19/2017 08/19/2017 08/20/2017 08/21/2017   Cholestrol 0 - 200 mg/dL - - - - -   LDLCALC 0 - 99 mg/dL - - - - -   HDL >40 mg/dL - - - - -   Trlycerides <150 mg/dL - - - - -   Hemoglobin A1c 4.8 - 5.6 % - - - - -   PHART 7.350 - 7.450 7.338(L) - - - -   PCO2ART 32.0 - 48.0 mmHg 54.1(H) - - - -   HCO3 20.0 - 28.0 mmol/L 29.1(H) - - - -   TCO2 22 - 32 mmol/L 31 28 - 30 30   ACIDBASEDEF 0.0 - 2.0 mmol/L - - - - -   O2SAT % 94.0 - 66.2 74.6 58.9      Capillary Blood Glucose: Lab Results  Component Value Date   GLUCAP 99 08/21/2017   GLUCAP 121 (H) 08/21/2017   GLUCAP 116 (H) 08/21/2017   GLUCAP 112 (H) 08/20/2017   GLUCAP 131 (H) 08/20/2017     Exercise Target Goals: Exercise Program Goal: Individual exercise prescription set using results from initial 6 min walk test and THRR while considering  patient's activity barriers and safety.   Exercise  Prescription Goal: Initial exercise prescription builds to 30-45 minutes a day of aerobic activity, 2-3 days per week.  Home exercise guidelines will be given to patient during program as part of exercise prescription that the participant will acknowledge.  Activity Barriers & Risk Stratification: Activity Barriers & Cardiac Risk Stratification - 11/10/17 1439      Activity Barriers & Cardiac Risk Stratification   Activity Barriers  Arthritis;Back Problems;Joint Problems;Deconditioning;Muscular Weakness    Cardiac Risk Stratification  High       6 Minute Walk: 6 Minute Walk    Row Name 11/10/17 1427         6 Minute Walk   Phase  Initial     Distance  800 feet     Walk Time  4.12 minutes     # of Rest Breaks  0     MPH  2.2     METS  1.4     RPE  11     VO2 Peak  4.9     Symptoms  Yes (comment)     Comments  stopped walk test 4.12 due to back pain 7/10. Pt subsided with rest     Resting HR  72 bpm      Resting BP  106/70     Resting Oxygen Saturation   95 %     Exercise Oxygen Saturation  during 6 min walk  97 %     Max Ex. HR  101 bpm     Max Ex. BP  116/80        Oxygen Initial Assessment:   Oxygen Re-Evaluation:   Oxygen Discharge (Final Oxygen Re-Evaluation):   Initial Exercise Prescription: Initial Exercise Prescription - 11/10/17 1400      Date of Initial Exercise RX and Referring Provider   Date  11/10/17      Recumbant Bike   Level  1.5    Minutes  10    METs  1.5      NuStep   Level  2    SPM  70    Minutes  10    METs  1.5      Arm Ergometer   Level  2    Minutes  10    METs  1.5      Prescription Details   Frequency (times per week)  3    Duration  Progress to 30 minutes of continuous aerobic without signs/symptoms of physical distress      Intensity   THRR 40-80% of Max Heartrate  58-115    Ratings of Perceived Exertion  11-13    Perceived Dyspnea  0-4      Progression   Progression  Continue to progress workloads to maintain intensity without signs/symptoms of physical distress.      Resistance Training   Training Prescription  Yes    Weight  2lbs    Reps  10-15       Perform Capillary Blood Glucose checks as needed.  Exercise Prescription Changes: Exercise Prescription Changes    Row Name 11/18/17 1458 11/21/17 1454 12/26/17 1455         Response to Exercise   Blood Pressure (Admit)  118/70  122/88  124/78     Blood Pressure (Exercise)  140/78  138/78  122/80     Blood Pressure (Exit)  112/78  108/70  122/70     Heart Rate (Admit)  97 bpm  95 bpm  84 bpm     Heart  Rate (Exercise)  120 bpm  114 bpm  116 bpm     Heart Rate (Exit)  98 bpm  89 bpm  82 bpm     Rating of Perceived Exertion (Exercise)  12  12  12      Symptoms  none  none  none     Duration  Progress to 30 minutes of  aerobic without signs/symptoms of physical distress  Progress to 30 minutes of  aerobic without signs/symptoms of physical distress  Progress to 30  minutes of  aerobic without signs/symptoms of physical distress     Intensity  THRR unchanged  THRR unchanged  THRR unchanged       Progression   Progression  Continue to progress workloads to maintain intensity without signs/symptoms of physical distress.  Continue to progress workloads to maintain intensity without signs/symptoms of physical distress.  Continue to progress workloads to maintain intensity without signs/symptoms of physical distress.     Average METs  2.1  2.1  2.5       Resistance Training   Training Prescription  Yes  Yes  Yes     Weight  2lbs  2lbs  3lbs     Reps  10-15  10-15  10-15     Time  10 Minutes  10 Minutes  10 Minutes       Interval Training   Interval Training  No  No  No       Recumbant Bike   Level  1.5  1.5  1.5     Minutes  10  10  10      METs  1.9  2  2.5       NuStep   Level  2  2  2      SPM  70  70  70     Minutes  10  10  10      METs  2.2  2.1  -       Arm Ergometer   Level  2  2  2      Minutes  10  10  10         Exercise Comments: Exercise Comments    Row Name 11/18/17 1557 11/21/17 1524 12/26/17 1505 12/28/17 1530     Exercise Comments  Patient tolerated exercise at low intensity without c/o.  Reviewed METs and goals with patient.  Reviewed METs and goals with patient.  Reviewed home exercise guidelines with patient.       Exercise Goals and Review: Exercise Goals    Row Name 11/10/17 1354 11/10/17 1420 11/10/17 1439         Exercise Goals   Increase Physical Activity  Yes  -  -     Intervention  Provide advice, education, support and counseling about physical activity/exercise needs.;Develop an individualized exercise prescription for aerobic and resistive training based on initial evaluation findings, risk stratification, comorbidities and participant's personal goals.  -  -     Expected Outcomes  Short Term: Attend rehab on a regular basis to increase amount of physical activity.;Long Term: Add in home exercise to make  exercise part of routine and to increase amount of physical activity.;Long Term: Exercising regularly at least 3-5 days a week.  -  -     Increase Strength and Stamina  Yes  - increase muscle tone, functional mobility, balance and overall endurance  Yes     Intervention  Provide advice, education, support and counseling about physical activity/exercise needs.;Develop an individualized  exercise prescription for aerobic and resistive training based on initial evaluation findings, risk stratification, comorbidities and participant's personal goals.  -  -     Expected Outcomes  Short Term: Increase workloads from initial exercise prescription for resistance, speed, and METs.;Short Term: Perform resistance training exercises routinely during rehab and add in resistance training at home;Long Term: Improve cardiorespiratory fitness, muscular endurance and strength as measured by increased METs and functional capacity ( )  -  -     Able to understand and use rate of perceived exertion (RPE) scale  Yes  -  -     Intervention  Provide education and explanation on how to use RPE scale  -  -     Expected Outcomes  Short Term: Able to use RPE daily in rehab to express subjective intensity level;Long Term:  Able to use RPE to guide intensity level when exercising independently  -  -     Knowledge and understanding of Target Heart Rate Range (THRR)  Yes  -  -     Intervention  Provide education and explanation of THRR including how the numbers were predicted and where they are located for reference  -  -     Expected Outcomes  Short Term: Able to state/look up THRR;Long Term: Able to use THRR to govern intensity when exercising independently;Short Term: Able to use daily as guideline for intensity in rehab  -  -     Able to check pulse independently  Yes  -  -     Intervention  Provide education and demonstration on how to check pulse in carotid and radial arteries.;Review the importance of being able to check your  own pulse for safety during independent exercise  -  -     Expected Outcomes  Short Term: Able to explain why pulse checking is important during independent exercise;Long Term: Able to check pulse independently and accurately  -  -     Understanding of Exercise Prescription  Yes  -  -     Intervention  Provide education, explanation, and written materials on patient's individual exercise prescription  -  -     Expected Outcomes  Short Term: Able to explain program exercise prescription;Long Term: Able to explain home exercise prescription to exercise independently  -  -        Exercise Goals Re-Evaluation : Exercise Goals Re-Evaluation    Row Name 11/18/17 1557 11/21/17 1524 12/26/17 1505 12/28/17 1530       Exercise Goal Re-Evaluation   Exercise Goals Review  Able to understand and use rate of perceived exertion (RPE) scale  Increase Physical Activity  Increase Physical Activity;Able to understand and use rate of perceived exertion (RPE) scale  Increase Physical Activity;Able to understand and use rate of perceived exertion (RPE) scale;Understanding of Exercise Prescription    Comments  Patient able to understand and use RPE scale appropriately.  Patient is off to a good start with exercise. Tolerated low-moderate intensity exercise at cardiac rehab. Pt states he's walking 15 minutes, 4-5 days/week.  Patient retruned to exercise today after being out x 1 month. Pt states he's been walking 10-15 minutes 3 days/week.  Reviewed METs and goals with patient including THRR, RPE scale, and endpoints for exercise. Pt is walking 13-14 minutes 3 days/week. Discussed increasing walking duration to achieve 30 minutes, and pt is amenable to this.    Expected Outcomes  Increase workloads as tolerated to help achieve personal health and fitness goals.  Increase workloads as tolerated to help achieve personal health and fitness goals.  Patient will increase home exercise duration and workloads at cardiac rehab to  help improve cardiorespiratory fitness.  Patient will walk 15 minutes, 2x's/day, 3 days/week to help improve energy and stamina       Discharge Exercise Prescription (Final Exercise Prescription Changes): Exercise Prescription Changes - 12/26/17 1455      Response to Exercise   Blood Pressure (Admit)  124/78    Blood Pressure (Exercise)  122/80    Blood Pressure (Exit)  122/70    Heart Rate (Admit)  84 bpm    Heart Rate (Exercise)  116 bpm    Heart Rate (Exit)  82 bpm    Rating of Perceived Exertion (Exercise)  12    Symptoms  none    Duration  Progress to 30 minutes of  aerobic without signs/symptoms of physical distress    Intensity  THRR unchanged      Progression   Progression  Continue to progress workloads to maintain intensity without signs/symptoms of physical distress.    Average METs  2.5      Resistance Training   Training Prescription  Yes    Weight  3lbs    Reps  10-15    Time  10 Minutes      Interval Training   Interval Training  No      Recumbant Bike   Level  1.5    Minutes  10    METs  2.5      NuStep   Level  2    SPM  70    Minutes  10      Arm Ergometer   Level  2    Minutes  10       Nutrition:  Target Goals: Understanding of nutrition guidelines, daily intake of sodium 1500mg , cholesterol 200mg , calories 30% from fat and 7% or less from saturated fats, daily to have 5 or more servings of fruits and vegetables.  Biometrics: Pre Biometrics - 11/10/17 1440      Pre Biometrics   Height  5' 8.75" (1.746 m)    Weight  95.6 kg    Waist Circumference  44.5 inches    Hip Circumference  43.5 inches    Waist to Hip Ratio  1.02 %    BMI (Calculated)  31.36    Triceps Skinfold  18 mm    % Body Fat  32 %    Grip Strength  33 kg    Flexibility  --   LBP!   Single Leg Stand  0 seconds        Nutrition Therapy Plan and Nutrition Goals:   Nutrition Assessments:   Nutrition Goals Re-Evaluation:   Nutrition Goals  Re-Evaluation:   Nutrition Goals Discharge (Final Nutrition Goals Re-Evaluation):   Psychosocial: Target Goals: Acknowledge presence or absence of significant depression and/or stress, maximize coping skills, provide positive support system. Participant is able to verbalize types and ability to use techniques and skills needed for reducing stress and depression.  Initial Review & Psychosocial Screening: Initial Psych Review & Screening - 11/10/17 1650      Initial Review   Current issues with  None Identified      Family Dynamics   Good Support System?  Yes   Nadine Counts has his wife daughter's and granddaughter's for support     Barriers   Psychosocial barriers to participate in program  There are no identifiable barriers or psychosocial  needs.      Screening Interventions   Interventions  Encouraged to exercise       Quality of Life Scores: Quality of Life - 11/10/17 1453      Quality of Life   Select  Quality of Life      Quality of Life Scores   Health/Function Pre  25.37 %    Socioeconomic Pre  28.33 %    Psych/Spiritual Pre  27.43 %    Family Pre  21.5 %    GLOBAL Pre  25.89 %      Scores of 19 and below usually indicate a poorer quality of life in these areas.  A difference of  2-3 points is a clinically meaningful difference.  A difference of 2-3 points in the total score of the Quality of Life Index has been associated with significant improvement in overall quality of life, self-image, physical symptoms, and general health in studies assessing change in quality of life.  PHQ-9: Recent Review Flowsheet Data    There is no flowsheet data to display.     Interpretation of Total Score  Total Score Depression Severity:  1-4 = Minimal depression, 5-9 = Mild depression, 10-14 = Moderate depression, 15-19 = Moderately severe depression, 20-27 = Severe depression   Psychosocial Evaluation and Intervention: Psychosocial Evaluation - 11/18/17 1543      Psychosocial  Evaluation & Interventions   Interventions  Encouraged to exercise with the program and follow exercise prescription    Comments  no psychosocial needs identified, no interventions necessary. pt enjoys "everything" admits he looks for ways to find pleasure in activities.    Expected Outcomes  pt will exhibit positive outlook with good outlook    Continue Psychosocial Services   No Follow up required       Psychosocial Re-Evaluation: Psychosocial Re-Evaluation    Row Name 12/01/17 1157 12/29/17 1431           Psychosocial Re-Evaluation   Current issues with  -  None Identified      Interventions  Encouraged to attend Cardiac Rehabilitation for the exercise  Encouraged to attend Cardiac Rehabilitation for the exercise      Continue Psychosocial Services   No Follow up required  No Follow up required         Psychosocial Discharge (Final Psychosocial Re-Evaluation): Psychosocial Re-Evaluation - 12/29/17 1431      Psychosocial Re-Evaluation   Current issues with  None Identified    Interventions  Encouraged to attend Cardiac Rehabilitation for the exercise    Continue Psychosocial Services   No Follow up required       Vocational Rehabilitation: Provide vocational rehab assistance to qualifying candidates.   Vocational Rehab Evaluation & Intervention: Vocational Rehab - 11/10/17 1652      Initial Vocational Rehab Evaluation & Intervention   Assessment shows need for Vocational Rehabilitation  No   Nadine Counts is retired and does not need vocational rehab at this time      Education: Education Goals: Education classes will be provided on a weekly basis, covering required topics. Participant will state understanding/return demonstration of topics presented.  Learning Barriers/Preferences: Learning Barriers/Preferences - 11/10/17 1356      Learning Barriers/Preferences   Learning Barriers  Sight;Hearing    Learning Preferences  Written Material;Video       Education  Topics: Count Your Pulse:  -Group instruction provided by verbal instruction, demonstration, patient participation and written materials to support subject.  Instructors address importance of being able  to find your pulse and how to count your pulse when at home without a heart monitor.  Patients get hands on experience counting their pulse with staff help and individually.   Heart Attack, Angina, and Risk Factor Modification:  -Group instruction provided by verbal instruction, video, and written materials to support subject.  Instructors address signs and symptoms of angina and heart attacks.    Also discuss risk factors for heart disease and how to make changes to improve heart health risk factors.   Functional Fitness:  -Group instruction provided by verbal instruction, demonstration, patient participation, and written materials to support subject.  Instructors address safety measures for doing things around the house.  Discuss how to get up and down off the floor, how to pick things up properly, how to safely get out of a chair without assistance, and balance training.   Meditation and Mindfulness:  -Group instruction provided by verbal instruction, patient participation, and written materials to support subject.  Instructor addresses importance of mindfulness and meditation practice to help reduce stress and improve awareness.  Instructor also leads participants through a meditation exercise.    Stretching for Flexibility and Mobility:  -Group instruction provided by verbal instruction, patient participation, and written materials to support subject.  Instructors lead participants through series of stretches that are designed to increase flexibility thus improving mobility.  These stretches are additional exercise for major muscle groups that are typically performed during regular warm up and cool down.   Hands Only CPR:  -Group verbal, video, and participation provides a basic overview of  AHA guidelines for community CPR. Role-play of emergencies allow participants the opportunity to practice calling for help and chest compression technique with discussion of AED use.   Hypertension: -Group verbal and written instruction that provides a basic overview of hypertension including the most recent diagnostic guidelines, risk factor reduction with self-care instructions and medication management.    Nutrition I class: Heart Healthy Eating:  -Group instruction provided by PowerPoint slides, verbal discussion, and written materials to support subject matter. The instructor gives an explanation and review of the Therapeutic Lifestyle Changes diet recommendations, which includes a discussion on lipid goals, dietary fat, sodium, fiber, plant stanol/sterol esters, sugar, and the components of a well-balanced, healthy diet.   Nutrition II class: Lifestyle Skills:  -Group instruction provided by PowerPoint slides, verbal discussion, and written materials to support subject matter. The instructor gives an explanation and review of label reading, grocery shopping for heart health, heart healthy recipe modifications, and ways to make healthier choices when eating out.   Diabetes Question & Answer:  -Group instruction provided by PowerPoint slides, verbal discussion, and written materials to support subject matter. The instructor gives an explanation and review of diabetes co-morbidities, pre- and post-prandial blood glucose goals, pre-exercise blood glucose goals, signs, symptoms, and treatment of hypoglycemia and hyperglycemia, and foot care basics.   Diabetes Blitz:  -Group instruction provided by PowerPoint slides, verbal discussion, and written materials to support subject matter. The instructor gives an explanation and review of the physiology behind type 1 and type 2 diabetes, diabetes medications and rational behind using different medications, pre- and post-prandial blood glucose  recommendations and Hemoglobin A1c goals, diabetes diet, and exercise including blood glucose guidelines for exercising safely.    Portion Distortion:  -Group instruction provided by PowerPoint slides, verbal discussion, written materials, and food models to support subject matter. The instructor gives an explanation of serving size versus portion size, changes in portions sizes over the  last 20 years, and what consists of a serving from each food group.   Stress Management:  -Group instruction provided by verbal instruction, video, and written materials to support subject matter.  Instructors review role of stress in heart disease and how to cope with stress positively.     Exercising on Your Own:  -Group instruction provided by verbal instruction, power point, and written materials to support subject.  Instructors discuss benefits of exercise, components of exercise, frequency and intensity of exercise, and end points for exercise.  Also discuss use of nitroglycerin and activating EMS.  Review options of places to exercise outside of rehab.  Review guidelines for sex with heart disease.   Cardiac Drugs I:  -Group instruction provided by verbal instruction and written materials to support subject.  Instructor reviews cardiac drug classes: antiplatelets, anticoagulants, beta blockers, and statins.  Instructor discusses reasons, side effects, and lifestyle considerations for each drug class.   Cardiac Drugs II:  -Group instruction provided by verbal instruction and written materials to support subject.  Instructor reviews cardiac drug classes: angiotensin converting enzyme inhibitors (ACE-I), angiotensin II receptor blockers (ARBs), nitrates, and calcium channel blockers.  Instructor discusses reasons, side effects, and lifestyle considerations for each drug class.   Anatomy and Physiology of the Circulatory System:  Group verbal and written instruction and models provide basic cardiac anatomy  and physiology, with the coronary electrical and arterial systems. Review of: AMI, Angina, Valve disease, Heart Failure, Peripheral Artery Disease, Cardiac Arrhythmia, Pacemakers, and the ICD.   Other Education:  -Group or individual verbal, written, or video instructions that support the educational goals of the cardiac rehab program.   Holiday Eating Survival Tips:  -Group instruction provided by PowerPoint slides, verbal discussion, and written materials to support subject matter. The instructor gives patients tips, tricks, and techniques to help them not only survive but enjoy the holidays despite the onslaught of food that accompanies the holidays.   Knowledge Questionnaire Score: Knowledge Questionnaire Score - 11/10/17 1442      Knowledge Questionnaire Score   Pre Score  20/24       Core Components/Risk Factors/Patient Goals at Admission: Personal Goals and Risk Factors at Admission - 11/10/17 1652      Core Components/Risk Factors/Patient Goals on Admission    Weight Management  Yes;Obesity    Intervention  Weight Management: Develop a combined nutrition and exercise program designed to reach desired caloric intake, while maintaining appropriate intake of nutrient and fiber, sodium and fats, and appropriate energy expenditure required for the weight goal.;Weight Management: Provide education and appropriate resources to help participant work on and attain dietary goals.;Weight Management/Obesity: Establish reasonable short term and long term weight goals.;Obesity: Provide education and appropriate resources to help participant work on and attain dietary goals.    Admit Weight  210 lb 12.2 oz (95.6 kg)    Goal Weight: Short Term  200 lb (90.7 kg)    Goal Weight: Long Term  190 lb (86.2 kg)    Expected Outcomes  Short Term: Continue to assess and modify interventions until short term weight is achieved;Long Term: Adherence to nutrition and physical activity/exercise program aimed  toward attainment of established weight goal;Weight Maintenance: Understanding of the daily nutrition guidelines, which includes 25-35% calories from fat, 7% or less cal from saturated fats, less than 200mg  cholesterol, less than 1.5gm of sodium, & 5 or more servings of fruits and vegetables daily;Weight Loss: Understanding of general recommendations for a balanced deficit meal plan, which promotes  1-2 lb weight loss per week and includes a negative energy balance of [phone] kcal/d;Understanding recommendations for meals to include 15-35% energy as protein, 25-35% energy from fat, 35-60% energy from carbohydrates, less than 200mg  of dietary cholesterol, 20-35 gm of total fiber daily;Understanding of distribution of calorie intake throughout the day with the consumption of 4-5 meals/snacks    Tobacco Cessation  Yes    Number of packs per day  Nadine Counts quit 08/15/17    Intervention  Assist the participant in steps to quit. Provide individualized education and counseling about committing to Tobacco Cessation, relapse prevention, and pharmacological support that can be provided by physician.;Education officer, environmental, assist with locating and accessing local/national Quit Smoking programs, and support quit date choice.    Expected Outcomes  Short Term: Will demonstrate readiness to quit, by selecting a quit date.;Short Term: Will quit all tobacco product use, adhering to prevention of relapse plan.;Long Term: Complete abstinence from all tobacco products for at least 12 months from quit date.    Lipids  Yes    Intervention  Provide education and support for participant on nutrition & aerobic/resistive exercise along with prescribed medications to achieve LDL 70mg , HDL >40mg .    Expected Outcomes  Short Term: Participant states understanding of desired cholesterol values and is compliant with medications prescribed. Participant is following exercise prescription and nutrition guidelines.;Long Term: Cholesterol  controlled with medications as prescribed, with individualized exercise RX and with personalized nutrition plan. Value goals: LDL < 70mg , HDL > 40 mg.    Stress  Yes    Intervention  Offer individual and/or small group education and counseling on adjustment to heart disease, stress management and health-related lifestyle change. Teach and support self-help strategies.;Refer participants experiencing significant psychosocial distress to appropriate mental health specialists for further evaluation and treatment. When possible, include family members and significant others in education/counseling sessions.    Expected Outcomes  Short Term: Participant demonstrates changes in health-related behavior, relaxation and other stress management skills, ability to obtain effective social support, and compliance with psychotropic medications if prescribed.;Long Term: Emotional wellbeing is indicated by absence of clinically significant psychosocial distress or social isolation.       Core Components/Risk Factors/Patient Goals Review:  Goals and Risk Factor Review    Row Name 11/18/17 1545 12/01/17 1158 12/29/17 1431         Core Components/Risk Factors/Patient Goals Review   Personal Goals Review  Weight Management/Obesity;Tobacco Cessation;Stress;Lipids  Weight Management/Obesity;Tobacco Cessation;Stress;Lipids  Weight Management/Obesity;Tobacco Cessation;Stress;Lipids     Review  pt with multiple CAD RF demonstrates eagerness to participate in CR activities.  pt encouraged to engage in all CR aspects for full program benefits. pt denies difficulty with tiobacco cessation and congratulated on his success.  pt personal goals are to increase strength, stamina and muscle tone.    pt with multiple CAD RF demonstrates eagerness to participate in CR activities.  pt encouraged to engage in all CR aspects for full program benefits. pt denies difficulty with tiobacco cessation and congratulated on his success.  pt  personal goals are to increase strength, stamina and muscle tone.    pt with multiple CAD RF demonstrates eagerness to participate in CR activities.  pt encouraged to engage in all CR aspects for full program benefits. pt denies difficulty with tiobacco cessation and congratulated on his success.  pt personal goals are to increase strength, stamina and muscle tone.       Expected Outcomes  pt will participate in CR exercise, nutrition and  lifestyle modification opportunities.   pt will participate in CR exercise, nutrition and lifestyle modification opportunities.   pt will participate in CR exercise, nutrition and lifestyle modification opportunities.         Core Components/Risk Factors/Patient Goals at Discharge (Final Review):  Goals and Risk Factor Review - 12/29/17 1431      Core Components/Risk Factors/Patient Goals Review   Personal Goals Review  Weight Management/Obesity;Tobacco Cessation;Stress;Lipids    Review  pt with multiple CAD RF demonstrates eagerness to participate in CR activities.  pt encouraged to engage in all CR aspects for full program benefits. pt denies difficulty with tiobacco cessation and congratulated on his success.  pt personal goals are to increase strength, stamina and muscle tone.      Expected Outcomes  pt will participate in CR exercise, nutrition and lifestyle modification opportunities.        ITP Comments: ITP Comments    Row Name 11/10/17 1354 11/18/17 1547 12/01/17 1155 12/29/17 1429     ITP Comments  Dr. Armanda Magic, Medical Director  pt started group exercise program. pt oriented to exercise equipment and safety routine. pt demostrates eagerness to participate in CR program.    30 Day ITP Review. Nadine Counts is with fair attendance and participation in phase 2 cardiac rehab  30 Day ITP Review. Nadine Counts has returned to exercise after being out with a foot injury.       Comments: See ITP comments.Gladstone Lighter, RN,BSN 12/29/2017 2:36 PM

## 2017-12-30 ENCOUNTER — Encounter (HOSPITAL_COMMUNITY)
Admission: RE | Admit: 2017-12-30 | Discharge: 2017-12-30 | Disposition: A | Discharge status: Home / Self Care | Payer: PPO | Source: Ambulatory Visit | Attending: Cardiovascular Disease | Admitting: Cardiovascular Disease

## 2017-12-30 ENCOUNTER — Encounter (HOSPITAL_COMMUNITY): Payer: PPO

## 2017-12-30 DIAGNOSIS — I214 Non-ST elevation (NSTEMI) myocardial infarction: Secondary | ICD-10-CM | POA: Diagnosis not present

## 2017-12-30 DIAGNOSIS — Z951 Presence of aortocoronary bypass graft: Secondary | ICD-10-CM

## 2017-12-30 DIAGNOSIS — Z952 Presence of prosthetic heart valve: Secondary | ICD-10-CM

## 2018-01-02 ENCOUNTER — Encounter (HOSPITAL_COMMUNITY)
Admission: RE | Admit: 2018-01-02 | Discharge: 2018-01-02 | Disposition: A | Discharge status: Home / Self Care | Payer: PPO | Source: Ambulatory Visit | Attending: Cardiovascular Disease | Admitting: Cardiovascular Disease

## 2018-01-02 ENCOUNTER — Encounter (HOSPITAL_COMMUNITY): Payer: PPO

## 2018-01-02 DIAGNOSIS — I214 Non-ST elevation (NSTEMI) myocardial infarction: Secondary | ICD-10-CM

## 2018-01-02 DIAGNOSIS — Z23 Encounter for immunization: Secondary | ICD-10-CM | POA: Diagnosis not present

## 2018-01-02 DIAGNOSIS — H6502 Acute serous otitis media, left ear: Secondary | ICD-10-CM | POA: Diagnosis not present

## 2018-01-02 DIAGNOSIS — R0982 Postnasal drip: Secondary | ICD-10-CM | POA: Diagnosis not present

## 2018-01-02 DIAGNOSIS — Z951 Presence of aortocoronary bypass graft: Secondary | ICD-10-CM

## 2018-01-02 DIAGNOSIS — L299 Pruritus, unspecified: Secondary | ICD-10-CM | POA: Diagnosis not present

## 2018-01-02 DIAGNOSIS — Z952 Presence of prosthetic heart valve: Secondary | ICD-10-CM

## 2018-01-02 DIAGNOSIS — H6123 Impacted cerumen, bilateral: Secondary | ICD-10-CM | POA: Diagnosis not present

## 2018-01-04 ENCOUNTER — Encounter (HOSPITAL_COMMUNITY)
Admission: RE | Admit: 2018-01-04 | Discharge: 2018-01-04 | Disposition: A | Discharge status: Home / Self Care | Payer: PPO | Source: Ambulatory Visit | Attending: Cardiovascular Disease | Admitting: Cardiovascular Disease

## 2018-01-04 ENCOUNTER — Encounter (HOSPITAL_COMMUNITY): Payer: PPO

## 2018-01-04 DIAGNOSIS — I214 Non-ST elevation (NSTEMI) myocardial infarction: Secondary | ICD-10-CM

## 2018-01-04 DIAGNOSIS — Z952 Presence of prosthetic heart valve: Secondary | ICD-10-CM

## 2018-01-04 DIAGNOSIS — Z951 Presence of aortocoronary bypass graft: Secondary | ICD-10-CM

## 2018-01-06 ENCOUNTER — Telehealth (HOSPITAL_COMMUNITY): Payer: Self-pay | Admitting: Internal Medicine

## 2018-01-06 ENCOUNTER — Encounter (HOSPITAL_COMMUNITY): Payer: PPO

## 2018-01-09 ENCOUNTER — Encounter (HOSPITAL_COMMUNITY): Payer: PPO

## 2018-01-11 ENCOUNTER — Encounter (HOSPITAL_COMMUNITY)
Admission: RE | Admit: 2018-01-11 | Discharge: 2018-01-11 | Disposition: A | Discharge status: Home / Self Care | Payer: PPO | Source: Ambulatory Visit | Attending: Cardiovascular Disease | Admitting: Cardiovascular Disease

## 2018-01-11 ENCOUNTER — Encounter (HOSPITAL_COMMUNITY): Payer: PPO

## 2018-01-11 DIAGNOSIS — Z951 Presence of aortocoronary bypass graft: Secondary | ICD-10-CM

## 2018-01-11 DIAGNOSIS — I214 Non-ST elevation (NSTEMI) myocardial infarction: Secondary | ICD-10-CM

## 2018-01-11 DIAGNOSIS — Z952 Presence of prosthetic heart valve: Secondary | ICD-10-CM

## 2018-01-13 ENCOUNTER — Encounter (HOSPITAL_COMMUNITY): Payer: PPO

## 2018-01-13 ENCOUNTER — Encounter (HOSPITAL_COMMUNITY)
Admission: RE | Admit: 2018-01-13 | Discharge: 2018-01-13 | Disposition: A | Discharge status: Home / Self Care | Payer: PPO | Source: Ambulatory Visit | Attending: Cardiovascular Disease | Admitting: Cardiovascular Disease

## 2018-01-13 DIAGNOSIS — I214 Non-ST elevation (NSTEMI) myocardial infarction: Secondary | ICD-10-CM | POA: Diagnosis not present

## 2018-01-13 DIAGNOSIS — Z951 Presence of aortocoronary bypass graft: Secondary | ICD-10-CM

## 2018-01-13 DIAGNOSIS — Z952 Presence of prosthetic heart valve: Secondary | ICD-10-CM

## 2018-01-16 ENCOUNTER — Encounter (HOSPITAL_COMMUNITY): Payer: PPO

## 2018-01-16 ENCOUNTER — Encounter (HOSPITAL_COMMUNITY)
Admission: RE | Admit: 2018-01-16 | Discharge: 2018-01-16 | Disposition: A | Discharge status: Home / Self Care | Payer: PPO | Source: Ambulatory Visit | Attending: Cardiovascular Disease | Admitting: Cardiovascular Disease

## 2018-01-16 DIAGNOSIS — Z951 Presence of aortocoronary bypass graft: Secondary | ICD-10-CM

## 2018-01-16 DIAGNOSIS — Z952 Presence of prosthetic heart valve: Secondary | ICD-10-CM

## 2018-01-16 DIAGNOSIS — I214 Non-ST elevation (NSTEMI) myocardial infarction: Secondary | ICD-10-CM | POA: Diagnosis not present

## 2018-01-18 ENCOUNTER — Encounter (HOSPITAL_COMMUNITY): Payer: PPO

## 2018-01-20 ENCOUNTER — Encounter (HOSPITAL_COMMUNITY): Payer: PPO

## 2018-01-23 ENCOUNTER — Encounter (HOSPITAL_COMMUNITY): Payer: PPO

## 2018-01-23 ENCOUNTER — Encounter (HOSPITAL_COMMUNITY)
Admission: RE | Admit: 2018-01-23 | Discharge: 2018-01-23 | Disposition: A | Discharge status: Home / Self Care | Payer: PPO | Source: Ambulatory Visit | Attending: Cardiovascular Disease | Admitting: Cardiovascular Disease

## 2018-01-23 DIAGNOSIS — Z952 Presence of prosthetic heart valve: Secondary | ICD-10-CM

## 2018-01-23 DIAGNOSIS — Z951 Presence of aortocoronary bypass graft: Secondary | ICD-10-CM

## 2018-01-23 DIAGNOSIS — I214 Non-ST elevation (NSTEMI) myocardial infarction: Secondary | ICD-10-CM

## 2018-01-24 NOTE — Progress Notes (Signed)
Incomplete Session Note  Patient Details  Name: Jordan Hebert MRN: [id number] Date of Birth: 05/01/1940 Referring Provider:    Bethann Berkshire did not complete his rehab session.  Jordan Hebert reports experiencing lower back pain for the past few days and was late arriving to exercise today. I advised Jordan Hebert that he not exercise if his back is still bothering him. Patient states understanding and says he will follow up with his PCP if he continues to have problems with his back.Jordan Lighter, RN,BSN 01/24/2018 2:07 PM

## 2018-01-25 ENCOUNTER — Encounter (HOSPITAL_COMMUNITY): Payer: PPO

## 2018-01-26 NOTE — Progress Notes (Signed)
Cardiac Individual Treatment Plan  Patient Details  Name: Jordan Hebert MRN: [id number] Date of Birth: Jun 01, 1940 Referring Provider:    Initial Encounter Date:    CARDIAC REHAB PHASE II ORIENTATION from 11/10/2017 in [facility] CARDIAC REHAB  Date  11/10/17      Visit Diagnosis: 08/17/17 S/P CABG x 4  08/17/17 S/P AVR  08/16/17 NSTEMI (non-ST elevated myocardial infarction) [facility])  Patient's Home Medications on Admission:  Current Outpatient Medications:  .  acetaminophen (TYLENOL) 325 MG tablet, Take 2 tablets (650 mg total) by mouth every 6 (six) hours as needed for mild pain., Disp: , Rfl:  .  aspirin 325 MG EC tablet, Take 1 tablet (325 mg total) by mouth daily., Disp: 30 tablet, Rfl: 0 .  atorvastatin (LIPITOR) 80 MG tablet, Take 1 tablet (80 mg total) by mouth daily at 6 PM., Disp: 30 tablet, Rfl: 1 .  cyclobenzaprine (FLEXERIL) 10 MG tablet, Take 10 mg by mouth 2 (two) times daily as needed for muscle spasms. , Disp: , Rfl:  .  levothyroxine (SYNTHROID, LEVOTHROID) 175 MCG tablet, Take 175 mcg by mouth daily before breakfast., Disp: , Rfl:  .  metoprolol tartrate (LOPRESSOR) 25 MG tablet, Take 0.5 tablets (12.5 mg total) by mouth 2 (two) times daily., Disp: 60 tablet, Rfl: 1 .  sertraline (ZOLOFT) 50 MG tablet, Take 50 mg by mouth daily., Disp: , Rfl:  .  vitamin B-12 (CYANOCOBALAMIN) 1000 MCG tablet, Take 1,000 mcg by mouth daily., Disp: , Rfl:   Past Medical History: Past Medical History:  Diagnosis Date  . Back pain   . Colon polyps   . Coronary artery disease   . Degenerative lumbar spinal stenosis   . Depression   . Erectile dysfunction   . Gynecomastia   . High cholesterol   . Hyperthyroidism   . Rheumatic fever   . Seborrhea   . Umbilical hernia     Tobacco Use: Social History   Tobacco Use  Smoking Status Former Smoker  . Types: Cigarettes  . Last attempt to quit: 08/15/2017  . Years since quitting: 0.4  Smokeless Tobacco Never  Used    Labs: Recent Review Advice worker    Labs for ITP Cardiac and Pulmonary Rehab Latest Ref Rng & Units 08/19/2017 08/19/2017 08/19/2017 08/20/2017 08/21/2017   Cholestrol 0 - 200 mg/dL - - - - -   LDLCALC 0 - 99 mg/dL - - - - -   HDL >78 mg/dL - - - - -   Trlycerides <150 mg/dL - - - - -   Hemoglobin A1c 4.8 - 5.6 % - - - - -   PHART 7.350 - 7.450 7.338(L) - - - -   PCO2ART 32.0 - 48.0 mmHg 54.1(H) - - - -   HCO3 20.0 - 28.0 mmol/L 29.1(H) - - - -   TCO2 22 - 32 mmol/L 31 28 - 30 30   ACIDBASEDEF 0.0 - 2.0 mmol/L - - - - -   O2SAT % 94.0 - 66.2 74.6 58.9      Capillary Blood Glucose: Lab Results  Component Value Date   GLUCAP 99 08/21/2017   GLUCAP 121 (H) 08/21/2017   GLUCAP 116 (H) 08/21/2017   GLUCAP 112 (H) 08/20/2017   GLUCAP 131 (H) 08/20/2017     Exercise Target Goals: Exercise Program Goal: Individual exercise prescription set using results from initial 6 min walk test and THRR while considering  patient's activity barriers and safety.   Exercise  Prescription Goal: Initial exercise prescription builds to 30-45 minutes a day of aerobic activity, 2-3 days per week.  Home exercise guidelines will be given to patient during program as part of exercise prescription that the participant will acknowledge.  Activity Barriers & Risk Stratification: Activity Barriers & Cardiac Risk Stratification - 11/10/17 1439      Activity Barriers & Cardiac Risk Stratification   Activity Barriers  Arthritis;Back Problems;Joint Problems;Deconditioning;Muscular Weakness    Cardiac Risk Stratification  High       6 Minute Walk: 6 Minute Walk    Row Name 11/10/17 1427         6 Minute Walk   Phase  Initial     Distance  800 feet     Walk Time  4.12 minutes     # of Rest Breaks  0     MPH  2.2     METS  1.4     RPE  11     VO2 Peak  4.9     Symptoms  Yes (comment)     Comments  stopped walk test 4.12 due to back pain 7/10. Pt subsided with rest     Resting HR  72 bpm      Resting BP  106/70     Resting Oxygen Saturation   95 %     Exercise Oxygen Saturation  during 6 min walk  97 %     Max Ex. HR  101 bpm     Max Ex. BP  116/80        Oxygen Initial Assessment:   Oxygen Re-Evaluation:   Oxygen Discharge (Final Oxygen Re-Evaluation):   Initial Exercise Prescription: Initial Exercise Prescription - 11/10/17 1400      Date of Initial Exercise RX and Referring Provider   Date  11/10/17      Recumbant Bike   Level  1.5    Minutes  10    METs  1.5      NuStep   Level  2    SPM  70    Minutes  10    METs  1.5      Arm Ergometer   Level  2    Minutes  10    METs  1.5      Prescription Details   Frequency (times per week)  3    Duration  Progress to 30 minutes of continuous aerobic without signs/symptoms of physical distress      Intensity   THRR 40-80% of Max Heartrate  58-115    Ratings of Perceived Exertion  11-13    Perceived Dyspnea  0-4      Progression   Progression  Continue to progress workloads to maintain intensity without signs/symptoms of physical distress.      Resistance Training   Training Prescription  Yes    Weight  2lbs    Reps  10-15       Perform Capillary Blood Glucose checks as needed.  Exercise Prescription Changes: Exercise Prescription Changes    Row Name 11/18/17 1458 11/21/17 1454 12/26/17 1455 01/04/18 1509 01/16/18 1456     Response to Exercise   Blood Pressure (Admit)  118/70  122/88  124/78  102/72  118/82   Blood Pressure (Exercise)  140/78  138/78  122/80  138/78  118/70   Blood Pressure (Exit)  112/78  108/70  122/70  120/70  120/78   Heart Rate (Admit)  97 bpm  95 bpm  84  bpm  98 bpm  92 bpm   Heart Rate (Exercise)  120 bpm  114 bpm  116 bpm  122 bpm  116 bpm   Heart Rate (Exit)  98 bpm  89 bpm  82 bpm  109 bpm  91 bpm   Rating of Perceived Exertion (Exercise)  12  12  12  12  12    Symptoms  none  none  none  none  none   Duration  Progress to 30 minutes of  aerobic without  signs/symptoms of physical distress  Progress to 30 minutes of  aerobic without signs/symptoms of physical distress  Progress to 30 minutes of  aerobic without signs/symptoms of physical distress  Progress to 30 minutes of  aerobic without signs/symptoms of physical distress  Progress to 30 minutes of  aerobic without signs/symptoms of physical distress   Intensity  THRR unchanged  THRR unchanged  THRR unchanged  THRR unchanged  THRR unchanged     Progression   Progression  Continue to progress workloads to maintain intensity without signs/symptoms of physical distress.  Continue to progress workloads to maintain intensity without signs/symptoms of physical distress.  Continue to progress workloads to maintain intensity without signs/symptoms of physical distress.  Continue to progress workloads to maintain intensity without signs/symptoms of physical distress.  Continue to progress workloads to maintain intensity without signs/symptoms of physical distress.   Average METs  2.1  2.1  2.5  2.6  3.9     Resistance Training   Training Prescription  Yes  Yes  Yes  No Relaxation day, no weights.  Yes   Weight  2lbs  2lbs  3lbs  -  3lbs   Reps  10-15  10-15  10-15  -  10-15   Time  10 Minutes  10 Minutes  10 Minutes  -  10 Minutes     Interval Training   Interval Training  No  No  No  No  No     Recumbant Bike   Level  1.5  1.5  1.5  2  3    Minutes  10  10  10  10  10    METs  1.9  2  2.5  2.4  -     NuStep   Level  2  2  2  2   -   SPM  70  70  70  70  -   Minutes  10  10  10  10   -   METs  2.2  2.1  -  2.7  -     Arm Ergometer   Level  2  2  2  2  2    Minutes  10  10  10  10  10      Rower   Level  -  -  -  -  4   Watts  -  -  -  -  31   Minutes  -  -  -  -  10   METs  -  -  -  -  4.64     Home Exercise Plan   Plans to continue exercise at  -  -  -  Home (comment) walking  Home (comment) walking   Frequency  -  -  -  Add 3 additional days to program exercise sessions.  Add 3  additional days to program exercise sessions.   Initial Home Exercises Provided  -  -  -  12/28/17  12/28/17      Exercise Comments: Exercise Comments    Row Name 11/18/17 1557 11/21/17 1524 12/26/17 1505 12/28/17 1530 01/04/18 1533   Exercise Comments  Patient tolerated exercise at low intensity without c/o.  Reviewed METs and goals with patient.  Reviewed METs and goals with patient.  Reviewed home exercise guidelines with patient.  Reviewed METs with patient.   Row Name 01/23/18 1500           Exercise Comments  Reviewed METs and goals with patient.          Exercise Goals and Review: Exercise Goals    Row Name 11/10/17 1354 11/10/17 1420 11/10/17 1439         Exercise Goals   Increase Physical Activity  Yes  -  -     Intervention  Provide advice, education, support and counseling about physical activity/exercise needs.;Develop an individualized exercise prescription for aerobic and resistive training based on initial evaluation findings, risk stratification, comorbidities and participant's personal goals.  -  -     Expected Outcomes  Short Term: Attend rehab on a regular basis to increase amount of physical activity.;Long Term: Add in home exercise to make exercise part of routine and to increase amount of physical activity.;Long Term: Exercising regularly at least 3-5 days a week.  -  -     Increase Strength and Stamina  Yes  - increase muscle tone, functional mobility, balance and overall endurance  Yes     Intervention  Provide advice, education, support and counseling about physical activity/exercise needs.;Develop an individualized exercise prescription for aerobic and resistive training based on initial evaluation findings, risk stratification, comorbidities and participant's personal goals.  -  -     Expected Outcomes  Short Term: Increase workloads from initial exercise prescription for resistance, speed, and METs.;Short Term: Perform resistance training exercises routinely  during rehab and add in resistance training at home;Long Term: Improve cardiorespiratory fitness, muscular endurance and strength as measured by increased METs and functional capacity ( )  -  -     Able to understand and use rate of perceived exertion (RPE) scale  Yes  -  -     Intervention  Provide education and explanation on how to use RPE scale  -  -     Expected Outcomes  Short Term: Able to use RPE daily in rehab to express subjective intensity level;Long Term:  Able to use RPE to guide intensity level when exercising independently  -  -     Knowledge and understanding of Target Heart Rate Range (THRR)  Yes  -  -     Intervention  Provide education and explanation of THRR including how the numbers were predicted and where they are located for reference  -  -     Expected Outcomes  Short Term: Able to state/look up THRR;Long Term: Able to use THRR to govern intensity when exercising independently;Short Term: Able to use daily as guideline for intensity in rehab  -  -     Able to check pulse independently  Yes  -  -     Intervention  Provide education and demonstration on how to check pulse in carotid and radial arteries.;Review the importance of being able to check your own pulse for safety during independent exercise  -  -     Expected Outcomes  Short Term: Able to explain why pulse checking is important during independent exercise;Long Term: Able to check pulse independently and accurately  -  -  Understanding of Exercise Prescription  Yes  -  -     Intervention  Provide education, explanation, and written materials on patient's individual exercise prescription  -  -     Expected Outcomes  Short Term: Able to explain program exercise prescription;Long Term: Able to explain home exercise prescription to exercise independently  -  -        Exercise Goals Re-Evaluation : Exercise Goals Re-Evaluation    Row Name 11/18/17 1557 11/21/17 1524 12/26/17 1505 12/28/17 1530 01/23/18 1500      Exercise Goal Re-Evaluation   Exercise Goals Review  Able to understand and use rate of perceived exertion (RPE) scale  Increase Physical Activity  Increase Physical Activity;Able to understand and use rate of perceived exertion (RPE) scale  Increase Physical Activity;Able to understand and use rate of perceived exertion (RPE) scale;Understanding of Exercise Prescription  Increase Physical Activity;Able to understand and use rate of perceived exertion (RPE) scale;Understanding of Exercise Prescription   Comments  Patient able to understand and use RPE scale appropriately.  Patient is off to a good start with exercise. Tolerated low-moderate intensity exercise at cardiac rehab. Pt states he's walking 15 minutes, 4-5 days/week.  Patient retruned to exercise today after being out x 1 month. Pt states he's been walking 10-15 minutes 3 days/week.  Reviewed METs and goals with patient including THRR, RPE scale, and endpoints for exercise. Pt is walking 13-14 minutes 3 days/week. Discussed increasing walking duration to achieve 30 minutes, and pt is amenable to this.  Patient states he's walking 3 days/week at home in addition to exercise at cardiac rehab.   Expected Outcomes  Increase workloads as tolerated to help achieve personal health and fitness goals.  Increase workloads as tolerated to help achieve personal health and fitness goals.  Patient will increase home exercise duration and workloads at cardiac rehab to help improve cardiorespiratory fitness.  Patient will walk 15 minutes, 2x's/day, 3 days/week to help improve energy and stamina  Progress workloads as tolerated.      Discharge Exercise Prescription (Final Exercise Prescription Changes): Exercise Prescription Changes - 01/16/18 1456      Response to Exercise   Blood Pressure (Admit)  118/82    Blood Pressure (Exercise)  118/70    Blood Pressure (Exit)  120/78    Heart Rate (Admit)  92 bpm    Heart Rate (Exercise)  116 bpm    Heart Rate (Exit)   91 bpm    Rating of Perceived Exertion (Exercise)  12    Symptoms  none    Duration  Progress to 30 minutes of  aerobic without signs/symptoms of physical distress    Intensity  THRR unchanged      Progression   Progression  Continue to progress workloads to maintain intensity without signs/symptoms of physical distress.    Average METs  3.9      Resistance Training   Training Prescription  Yes    Weight  3lbs    Reps  10-15    Time  10 Minutes      Interval Training   Interval Training  No      Recumbant Bike   Level  3    Minutes  10    METs  --      NuStep   Level  --    SPM  --    Minutes  --    METs  --      Arm Ergometer   Level  2  Minutes  10      Rower   Level  4    Watts  31    Minutes  10    METs  4.64      Home Exercise Plan   Plans to continue exercise at  Home (comment)   walking   Frequency  Add 3 additional days to program exercise sessions.    Initial Home Exercises Provided  12/28/17       Nutrition:  Target Goals: Understanding of nutrition guidelines, daily intake of sodium 1500mg , cholesterol 200mg , calories 30% from fat and 7% or less from saturated fats, daily to have 5 or more servings of fruits and vegetables.  Biometrics: Pre Biometrics - 11/10/17 1440      Pre Biometrics   Height  5' 8.75" (1.746 m)    Weight  95.6 kg    Waist Circumference  44.5 inches    Hip Circumference  43.5 inches    Waist to Hip Ratio  1.02 %    BMI (Calculated)  31.36    Triceps Skinfold  18 mm    % Body Fat  32 %    Grip Strength  33 kg    Flexibility  --   LBP!   Single Leg Stand  0 seconds        Nutrition Therapy Plan and Nutrition Goals:   Nutrition Assessments:   Nutrition Goals Re-Evaluation:   Nutrition Goals Re-Evaluation:   Nutrition Goals Discharge (Final Nutrition Goals Re-Evaluation):   Psychosocial: Target Goals: Acknowledge presence or absence of significant depression and/or stress, maximize coping skills,  provide positive support system. Participant is able to verbalize types and ability to use techniques and skills needed for reducing stress and depression.  Initial Review & Psychosocial Screening: Initial Psych Review & Screening - 11/10/17 1650      Initial Review   Current issues with  None Identified      Family Dynamics   Good Support System?  Yes   Jordan Hebert has his wife daughter's and granddaughter's for support     Barriers   Psychosocial barriers to participate in program  There are no identifiable barriers or psychosocial needs.      Screening Interventions   Interventions  Encouraged to exercise       Quality of Life Scores: Quality of Life - 11/10/17 1453      Quality of Life   Select  Quality of Life      Quality of Life Scores   Health/Function Pre  25.37 %    Socioeconomic Pre  28.33 %    Psych/Spiritual Pre  27.43 %    Family Pre  21.5 %    GLOBAL Pre  25.89 %      Scores of 19 and below usually indicate a poorer quality of life in these areas.  A difference of  2-3 points is a clinically meaningful difference.  A difference of 2-3 points in the total score of the Quality of Life Index has been associated with significant improvement in overall quality of life, self-image, physical symptoms, and general health in studies assessing change in quality of life.  PHQ-9: Recent Review Flowsheet Data    There is no flowsheet data to display.     Interpretation of Total Score  Total Score Depression Severity:  1-4 = Minimal depression, 5-9 = Mild depression, 10-14 = Moderate depression, 15-19 = Moderately severe depression, 20-27 = Severe depression   Psychosocial Evaluation and Intervention: Psychosocial Evaluation -  11/18/17 1543      Psychosocial Evaluation & Interventions   Interventions  Encouraged to exercise with the program and follow exercise prescription    Comments  no psychosocial needs identified, no interventions necessary. pt enjoys "everything"  admits he looks for ways to find pleasure in activities.    Expected Outcomes  pt will exhibit positive outlook with good outlook    Continue Psychosocial Services   No Follow up required       Psychosocial Re-Evaluation: Psychosocial Re-Evaluation    Row Name 12/01/17 1157 12/29/17 1431 01/26/18 1600         Psychosocial Re-Evaluation   Current issues with  -  None Identified  None Identified     Interventions  Encouraged to attend Cardiac Rehabilitation for the exercise  Encouraged to attend Cardiac Rehabilitation for the exercise  Encouraged to attend Cardiac Rehabilitation for the exercise     Continue Psychosocial Services   No Follow up required  No Follow up required  No Follow up required        Psychosocial Discharge (Final Psychosocial Re-Evaluation): Psychosocial Re-Evaluation - 01/26/18 1600      Psychosocial Re-Evaluation   Current issues with  None Identified    Interventions  Encouraged to attend Cardiac Rehabilitation for the exercise    Continue Psychosocial Services   No Follow up required       Vocational Rehabilitation: Provide vocational rehab assistance to qualifying candidates.   Vocational Rehab Evaluation & Intervention: Vocational Rehab - 11/10/17 1652      Initial Vocational Rehab Evaluation & Intervention   Assessment shows need for Vocational Rehabilitation  No   Jordan Hebert is retired and does not need vocational rehab at this time      Education: Education Goals: Education classes will be provided on a weekly basis, covering required topics. Participant will state understanding/return demonstration of topics presented.  Learning Barriers/Preferences: Learning Barriers/Preferences - 11/10/17 1356      Learning Barriers/Preferences   Learning Barriers  Sight;Hearing    Learning Preferences  Written Material;Video       Education Topics: Count Your Pulse:  -Group instruction provided by verbal instruction, demonstration, patient participation  and written materials to support subject.  Instructors address importance of being able to find your pulse and how to count your pulse when at home without a heart monitor.  Patients get hands on experience counting their pulse with staff help and individually.   Heart Attack, Angina, and Risk Factor Modification:  -Group instruction provided by verbal instruction, video, and written materials to support subject.  Instructors address signs and symptoms of angina and heart attacks.    Also discuss risk factors for heart disease and how to make changes to improve heart health risk factors.   Functional Fitness:  -Group instruction provided by verbal instruction, demonstration, patient participation, and written materials to support subject.  Instructors address safety measures for doing things around the house.  Discuss how to get up and down off the floor, how to pick things up properly, how to safely get out of a chair without assistance, and balance training.   Meditation and Mindfulness:  -Group instruction provided by verbal instruction, patient participation, and written materials to support subject.  Instructor addresses importance of mindfulness and meditation practice to help reduce stress and improve awareness.  Instructor also leads participants through a meditation exercise.    Stretching for Flexibility and Mobility:  -Group instruction provided by verbal instruction, patient participation, and written materials to  support subject.  Instructors lead participants through series of stretches that are designed to increase flexibility thus improving mobility.  These stretches are additional exercise for major muscle groups that are typically performed during regular warm up and cool down.   Hands Only CPR:  -Group verbal, video, and participation provides a basic overview of AHA guidelines for community CPR. Role-play of emergencies allow participants the opportunity to practice calling  for help and chest compression technique with discussion of AED use.   Hypertension: -Group verbal and written instruction that provides a basic overview of hypertension including the most recent diagnostic guidelines, risk factor reduction with self-care instructions and medication management.    Nutrition I class: Heart Healthy Eating:  -Group instruction provided by PowerPoint slides, verbal discussion, and written materials to support subject matter. The instructor gives an explanation and review of the Therapeutic Lifestyle Changes diet recommendations, which includes a discussion on lipid goals, dietary fat, sodium, fiber, plant stanol/sterol esters, sugar, and the components of a well-balanced, healthy diet.   Nutrition II class: Lifestyle Skills:  -Group instruction provided by PowerPoint slides, verbal discussion, and written materials to support subject matter. The instructor gives an explanation and review of label reading, grocery shopping for heart health, heart healthy recipe modifications, and ways to make healthier choices when eating out.   Diabetes Question & Answer:  -Group instruction provided by PowerPoint slides, verbal discussion, and written materials to support subject matter. The instructor gives an explanation and review of diabetes co-morbidities, pre- and post-prandial blood glucose goals, pre-exercise blood glucose goals, signs, symptoms, and treatment of hypoglycemia and hyperglycemia, and foot care basics.   Diabetes Blitz:  -Group instruction provided by PowerPoint slides, verbal discussion, and written materials to support subject matter. The instructor gives an explanation and review of the physiology behind type 1 and type 2 diabetes, diabetes medications and rational behind using different medications, pre- and post-prandial blood glucose recommendations and Hemoglobin A1c goals, diabetes diet, and exercise including blood glucose guidelines for exercising  safely.    Portion Distortion:  -Group instruction provided by PowerPoint slides, verbal discussion, written materials, and food models to support subject matter. The instructor gives an explanation of serving size versus portion size, changes in portions sizes over the last 20 years, and what consists of a serving from each food group.   Stress Management:  -Group instruction provided by verbal instruction, video, and written materials to support subject matter.  Instructors review role of stress in heart disease and how to cope with stress positively.     Exercising on Your Own:  -Group instruction provided by verbal instruction, power point, and written materials to support subject.  Instructors discuss benefits of exercise, components of exercise, frequency and intensity of exercise, and end points for exercise.  Also discuss use of nitroglycerin and activating EMS.  Review options of places to exercise outside of rehab.  Review guidelines for sex with heart disease.   Cardiac Drugs I:  -Group instruction provided by verbal instruction and written materials to support subject.  Instructor reviews cardiac drug classes: antiplatelets, anticoagulants, beta blockers, and statins.  Instructor discusses reasons, side effects, and lifestyle considerations for each drug class.   Cardiac Drugs II:  -Group instruction provided by verbal instruction and written materials to support subject.  Instructor reviews cardiac drug classes: angiotensin converting enzyme inhibitors (ACE-I), angiotensin II receptor blockers (ARBs), nitrates, and calcium channel blockers.  Instructor discusses reasons, side effects, and lifestyle considerations for each drug class.  Anatomy and Physiology of the Circulatory System:  Group verbal and written instruction and models provide basic cardiac anatomy and physiology, with the coronary electrical and arterial systems. Review of: AMI, Angina, Valve disease, Heart  Failure, Peripheral Artery Disease, Cardiac Arrhythmia, Pacemakers, and the ICD.   Other Education:  -Group or individual verbal, written, or video instructions that support the educational goals of the cardiac rehab program.   Holiday Eating Survival Tips:  -Group instruction provided by PowerPoint slides, verbal discussion, and written materials to support subject matter. The instructor gives patients tips, tricks, and techniques to help them not only survive but enjoy the holidays despite the onslaught of food that accompanies the holidays.   Knowledge Questionnaire Score: Knowledge Questionnaire Score - 11/10/17 1442      Knowledge Questionnaire Score   Pre Score  20/24       Core Components/Risk Factors/Patient Goals at Admission: Personal Goals and Risk Factors at Admission - 11/10/17 1652      Core Components/Risk Factors/Patient Goals on Admission    Weight Management  Yes;Obesity    Intervention  Weight Management: Develop a combined nutrition and exercise program designed to reach desired caloric intake, while maintaining appropriate intake of nutrient and fiber, sodium and fats, and appropriate energy expenditure required for the weight goal.;Weight Management: Provide education and appropriate resources to help participant work on and attain dietary goals.;Weight Management/Obesity: Establish reasonable short term and long term weight goals.;Obesity: Provide education and appropriate resources to help participant work on and attain dietary goals.    Admit Weight  210 lb 12.2 oz (95.6 kg)    Goal Weight: Short Term  200 lb (90.7 kg)    Goal Weight: Long Term  190 lb (86.2 kg)    Expected Outcomes  Short Term: Continue to assess and modify interventions until short term weight is achieved;Long Term: Adherence to nutrition and physical activity/exercise program aimed toward attainment of established weight goal;Weight Maintenance: Understanding of the daily nutrition guidelines,  which includes 25-35% calories from fat, 7% or less cal from saturated fats, less than 200mg  cholesterol, less than 1.5gm of sodium, & 5 or more servings of fruits and vegetables daily;Weight Loss: Understanding of general recommendations for a balanced deficit meal plan, which promotes 1-2 lb weight loss per week and includes a negative energy balance of [phone] kcal/d;Understanding recommendations for meals to include 15-35% energy as protein, 25-35% energy from fat, 35-60% energy from carbohydrates, less than 200mg  of dietary cholesterol, 20-35 gm of total fiber daily;Understanding of distribution of calorie intake throughout the day with the consumption of 4-5 meals/snacks    Tobacco Cessation  Yes    Number of packs per day  Jordan Hebert quit 08/15/17    Intervention  Assist the participant in steps to quit. Provide individualized education and counseling about committing to Tobacco Cessation, relapse prevention, and pharmacological support that can be provided by physician.;Education officer, environmental, assist with locating and accessing local/national Quit Smoking programs, and support quit date choice.    Expected Outcomes  Short Term: Will demonstrate readiness to quit, by selecting a quit date.;Short Term: Will quit all tobacco product use, adhering to prevention of relapse plan.;Long Term: Complete abstinence from all tobacco products for at least 12 months from quit date.    Lipids  Yes    Intervention  Provide education and support for participant on nutrition & aerobic/resistive exercise along with prescribed medications to achieve LDL 70mg , HDL >40mg .    Expected Outcomes  Short Term: Participant states  understanding of desired cholesterol values and is compliant with medications prescribed. Participant is following exercise prescription and nutrition guidelines.;Long Term: Cholesterol controlled with medications as prescribed, with individualized exercise RX and with personalized nutrition plan.  Value goals: LDL < 70mg , HDL > 40 mg.    Stress  Yes    Intervention  Offer individual and/or small group education and counseling on adjustment to heart disease, stress management and health-related lifestyle change. Teach and support self-help strategies.;Refer participants experiencing significant psychosocial distress to appropriate mental health specialists for further evaluation and treatment. When possible, include family members and significant others in education/counseling sessions.    Expected Outcomes  Short Term: Participant demonstrates changes in health-related behavior, relaxation and other stress management skills, ability to obtain effective social support, and compliance with psychotropic medications if prescribed.;Long Term: Emotional wellbeing is indicated by absence of clinically significant psychosocial distress or social isolation.       Core Components/Risk Factors/Patient Goals Review:  Goals and Risk Factor Review    Row Name 11/18/17 1545 12/01/17 1158 12/29/17 1431 01/26/18 1600       Core Components/Risk Factors/Patient Goals Review   Personal Goals Review  Weight Management/Obesity;Tobacco Cessation;Stress;Lipids  Weight Management/Obesity;Tobacco Cessation;Stress;Lipids  Weight Management/Obesity;Tobacco Cessation;Stress;Lipids  Weight Management/Obesity;Tobacco Cessation;Stress;Lipids    Review  pt with multiple CAD RF demonstrates eagerness to participate in CR activities.  pt encouraged to engage in all CR aspects for full program benefits. pt denies difficulty with tiobacco cessation and congratulated on his success.  pt personal goals are to increase strength, stamina and muscle tone.    pt with multiple CAD RF demonstrates eagerness to participate in CR activities.  pt encouraged to engage in all CR aspects for full program benefits. pt denies difficulty with tiobacco cessation and congratulated on his success.  pt personal goals are to increase strength, stamina  and muscle tone.    pt with multiple CAD RF demonstrates eagerness to participate in CR activities.  pt encouraged to engage in all CR aspects for full program benefits. pt denies difficulty with tiobacco cessation and congratulated on his success.  pt personal goals are to increase strength, stamina and muscle tone.    pt with multiple CAD RF demonstrates eagerness to participate in CR activities.  pt encouraged to engage in all CR aspects for full program benefits. pt denies difficulty with tiobacco cessation and congratulated on his success.  pt personal goals are to increase strength, stamina and muscle tone.      Expected Outcomes  pt will participate in CR exercise, nutrition and lifestyle modification opportunities.   pt will participate in CR exercise, nutrition and lifestyle modification opportunities.   pt will participate in CR exercise, nutrition and lifestyle modification opportunities.   pt will participate in CR exercise, nutrition and lifestyle modification opportunities.        Core Components/Risk Factors/Patient Goals at Discharge (Final Review):  Goals and Risk Factor Review - 01/26/18 1600      Core Components/Risk Factors/Patient Goals Review   Personal Goals Review  Weight Management/Obesity;Tobacco Cessation;Stress;Lipids    Review  pt with multiple CAD RF demonstrates eagerness to participate in CR activities.  pt encouraged to engage in all CR aspects for full program benefits. pt denies difficulty with tiobacco cessation and congratulated on his success.  pt personal goals are to increase strength, stamina and muscle tone.      Expected Outcomes  pt will participate in CR exercise, nutrition and lifestyle modification opportunities.  ITP Comments: ITP Comments    Row Name 11/10/17 1354 11/18/17 1547 12/01/17 1155 12/29/17 1429 01/26/18 1559   ITP Comments  Dr. Armanda Magic, Medical Director  pt started group exercise program. pt oriented to exercise equipment and  safety routine. pt demostrates eagerness to participate in CR program.    30 Day ITP Review. Jordan Hebert is with fair attendance and participation in phase 2 cardiac rehab  30 Day ITP Review. Jordan Hebert has returned to exercise after being out with a foot injury.  30 Day ITP Review. Jordan Hebert has good participation in phase 2 cardiac rehab. Jordan Hebert recently said he hurt his back      Comments: See ITP comments.Gladstone Lighter, RN,BSN 01/26/2018 4:02 PM

## 2018-01-27 ENCOUNTER — Encounter (HOSPITAL_COMMUNITY): Payer: PPO

## 2018-01-30 ENCOUNTER — Encounter (HOSPITAL_COMMUNITY): Payer: PPO

## 2018-02-01 ENCOUNTER — Encounter (HOSPITAL_COMMUNITY): Payer: PPO

## 2018-02-03 ENCOUNTER — Encounter (HOSPITAL_COMMUNITY): Payer: PPO

## 2018-02-06 ENCOUNTER — Encounter (HOSPITAL_COMMUNITY): Payer: PPO

## 2018-02-08 ENCOUNTER — Encounter (HOSPITAL_COMMUNITY): Payer: PPO

## 2018-02-08 ENCOUNTER — Telehealth: Payer: Self-pay | Admitting: Cardiovascular Disease

## 2018-02-08 ENCOUNTER — Encounter (HOSPITAL_COMMUNITY)
Admission: RE | Admit: 2018-02-08 | Discharge: 2018-02-08 | Disposition: A | Discharge status: Home / Self Care | Payer: PPO | Source: Ambulatory Visit | Attending: Cardiovascular Disease | Admitting: Cardiovascular Disease

## 2018-02-08 DIAGNOSIS — E7800 Pure hypercholesterolemia, unspecified: Secondary | ICD-10-CM | POA: Insufficient documentation

## 2018-02-08 DIAGNOSIS — I214 Non-ST elevation (NSTEMI) myocardial infarction: Secondary | ICD-10-CM | POA: Diagnosis not present

## 2018-02-08 DIAGNOSIS — Z951 Presence of aortocoronary bypass graft: Secondary | ICD-10-CM

## 2018-02-08 DIAGNOSIS — Z87891 Personal history of nicotine dependence: Secondary | ICD-10-CM | POA: Insufficient documentation

## 2018-02-08 DIAGNOSIS — Z79890 Hormone replacement therapy (postmenopausal): Secondary | ICD-10-CM | POA: Insufficient documentation

## 2018-02-08 DIAGNOSIS — E0590 Thyrotoxicosis, unspecified without thyrotoxic crisis or storm: Secondary | ICD-10-CM | POA: Insufficient documentation

## 2018-02-08 DIAGNOSIS — Z79899 Other long term (current) drug therapy: Secondary | ICD-10-CM | POA: Diagnosis not present

## 2018-02-08 DIAGNOSIS — Z952 Presence of prosthetic heart valve: Secondary | ICD-10-CM

## 2018-02-08 NOTE — Progress Notes (Signed)
Jordan Hebert returned to exercise today after being absent due to back problems. Jordan Hebert's weight is up 4.6 kg from his last exercise session on 01/16/18. Jordan Hebert denies shortness of breath. Upon assessment lung fields slightly diminished right posterior base otherwise lungs clear. Oxygen saturation 95% on room air. No peripheral edema noted. Jordan KnackSharon Dobkins Jordan Hebert called and notified at Dr Hazle CocaBerry's office. Patient denies shortness of breath. But admits to eating more since he has been at home not exercising. Patient also says he is taking Plavix every day. Jordan Hebert [facility]:30 for follow up. Jordan Hebert was instructed to monitor his weight daily and to bring his medicine bottles to his doctor Hebert. Patient states understanding.Will fax exercise flow sheets to Dr. Hazle CocaBerry's office for review

## 2018-02-08 NOTE — Telephone Encounter (Signed)
SPOKE CARDIAC REHAB- Jordan Hebert  STATES PATIENT RETURNING TO REHAB AFTER  - BEEN HAVING SOME BACK ISSUE -WHICH LIMITED ACTIVITY   WEIGHT GAIN @ 12 LBS--   PATIENT STATES HE TAKING PLAVIX ,BUT NOT ON MEDICATION LIST  WILL SEND  READING  FROM REHAB  APPOINTMENT SCHEDULE 02/14/18  BRING WEIGHT READING AND ALL MEDICATION BOTTLES   VERBALIZED UNDERSTANDING BY REHAB AND PATIENT

## 2018-02-08 NOTE — Telephone Encounter (Signed)
New message:      Pt c/o swelling: STAT is pt has developed SOB within 24 hours  1) How much weight have you gained and in what time span? 4.6 kg  2) If swelling, where is the swelling located? unsure  3) Are you currently taking a fluid pill?   4) Are you currently SOB? No  5) Do you have a log of your daily weights (if so, list)?   6) Have you gained 3 pounds in a day or 5 pounds in a week?   7) Have you traveled recently?

## 2018-02-10 ENCOUNTER — Encounter (HOSPITAL_COMMUNITY): Payer: PPO

## 2018-02-10 ENCOUNTER — Encounter (HOSPITAL_COMMUNITY)
Admission: RE | Admit: 2018-02-10 | Discharge: 2018-02-10 | Disposition: A | Discharge status: Home / Self Care | Payer: PPO | Source: Ambulatory Visit | Attending: Cardiovascular Disease | Admitting: Cardiovascular Disease

## 2018-02-10 DIAGNOSIS — Z952 Presence of prosthetic heart valve: Secondary | ICD-10-CM

## 2018-02-10 DIAGNOSIS — Z951 Presence of aortocoronary bypass graft: Secondary | ICD-10-CM

## 2018-02-10 DIAGNOSIS — I214 Non-ST elevation (NSTEMI) myocardial infarction: Secondary | ICD-10-CM | POA: Diagnosis not present

## 2018-02-13 ENCOUNTER — Encounter (HOSPITAL_COMMUNITY): Payer: PPO

## 2018-02-14 ENCOUNTER — Ambulatory Visit: Payer: PPO | Admitting: Physician Assistant

## 2018-02-15 ENCOUNTER — Encounter (HOSPITAL_COMMUNITY)
Admission: RE | Admit: 2018-02-15 | Discharge: 2018-02-15 | Disposition: A | Discharge status: Home / Self Care | Payer: PPO | Source: Ambulatory Visit | Attending: Cardiovascular Disease | Admitting: Cardiovascular Disease

## 2018-02-15 ENCOUNTER — Encounter (HOSPITAL_COMMUNITY): Payer: PPO

## 2018-02-15 VITALS — BP 122/80 | HR 102 | Ht 68.75 in | Wt 220.2 lb

## 2018-02-15 DIAGNOSIS — Z952 Presence of prosthetic heart valve: Secondary | ICD-10-CM

## 2018-02-15 DIAGNOSIS — I214 Non-ST elevation (NSTEMI) myocardial infarction: Secondary | ICD-10-CM | POA: Diagnosis not present

## 2018-02-15 DIAGNOSIS — Z951 Presence of aortocoronary bypass graft: Secondary | ICD-10-CM

## 2018-02-16 NOTE — Progress Notes (Signed)
Cardiac Individual Treatment Plan  Patient Details  Name: Jordan Hebert MRN: [id number] Date of Birth: [birth year] Referring Provider:    Initial Encounter Date:    CARDIAC REHAB PHASE II ORIENTATION from 11/10/2017 in [facility] [place] [facility]  Date  11/10/17      Visit Diagnosis: 08/17/17 S/P CABG x 4  08/17/17 S/P AVR  08/16/17 NSTEMI (non-ST elevated myocardial infarction) [facility])  Patient's Home Medications on Admission:  Current Outpatient Medications:  .  acetaminophen (TYLENOL) 325 MG tablet, Take 2 tablets (650 mg total) by mouth every 6 (six) hours as needed for mild pain., Disp: , Rfl:  .  aspirin 325 MG EC tablet, Take 1 tablet (325 mg total) by mouth daily. (Patient taking differently: Take 81 mg by mouth daily. ), Disp: 30 tablet, Rfl: 0 .  atorvastatin (LIPITOR) 80 MG tablet, Take 1 tablet (80 mg total) by mouth daily at 6 PM., Disp: 30 tablet, Rfl: 1 .  cyclobenzaprine (FLEXERIL) 10 MG tablet, Take 10 mg by mouth 2 (two) times daily as needed for muscle spasms. , Disp: , Rfl:  .  levothyroxine (SYNTHROID, LEVOTHROID) 175 MCG tablet, Take 175 mcg by mouth daily before breakfast., Disp: , Rfl:  .  metoprolol tartrate (LOPRESSOR) 25 MG tablet, Take 0.5 tablets (12.5 mg total) by mouth 2 (two) times daily., Disp: 60 tablet, Rfl: 1 .  sertraline (ZOLOFT) 50 MG tablet, Take 50 mg by mouth daily., Disp: , Rfl:  .  vitamin B-12 (CYANOCOBALAMIN) 1000 MCG tablet, Take 1,000 mcg by mouth daily., Disp: , Rfl:   Past Medical History: Past Medical History:  Diagnosis Date  . Back pain   . Colon polyps   . Coronary artery disease   . Degenerative lumbar spinal stenosis   . Depression   . Erectile dysfunction   . Gynecomastia   . High cholesterol   . Hyperthyroidism   . Rheumatic fever   . Seborrhea   . Umbilical hernia     Tobacco Use: Social History   Tobacco Use  Smoking Status Former Smoker  . Types: Cigarettes  . Last attempt to quit: 08/15/2017   . Years since quitting: 0.5  Smokeless Tobacco Never Used    Labs: Recent Review Advice worker    Labs for ITP Cardiac and Pulmonary Rehab Latest Ref Rng & Units 08/19/2017 08/19/2017 08/19/2017 08/20/2017 08/21/2017   Cholestrol 0 - 200 mg/dL - - - - -   LDLCALC 0 - 99 mg/dL - - - - -   HDL >82 mg/dL - - - - -   Trlycerides <150 mg/dL - - - - -   Hemoglobin A1c 4.8 - 5.6 % - - - - -   PHART 7.350 - 7.450 7.338(L) - - - -   PCO2ART 32.0 - 48.0 mmHg 54.1(H) - - - -   HCO3 20.0 - 28.0 mmol/L 29.1(H) - - - -   TCO2 22 - 32 mmol/L 31 28 - 30 30   ACIDBASEDEF 0.0 - 2.0 mmol/L - - - - -   O2SAT % 94.0 - 66.2 74.6 58.9      Capillary Blood Glucose: Lab Results  Component Value Date   GLUCAP 99 08/21/2017   GLUCAP 121 (H) 08/21/2017   GLUCAP 116 (H) 08/21/2017   GLUCAP 112 (H) 08/20/2017   GLUCAP 131 (H) 08/20/2017     Exercise Target Goals: Exercise Program Goal: Individual exercise prescription set using results from initial 6 min walk test and THRR while  considering  patient's activity barriers and safety.   Exercise Prescription Goal: Initial exercise prescription builds to 30-45 minutes a day of aerobic activity, 2-3 days per week.  Home exercise guidelines will be given to patient during program as part of exercise prescription that the participant will acknowledge.  Activity Barriers & Risk Stratification: Activity Barriers & Cardiac Risk Stratification - 11/10/17 1439      Activity Barriers & Cardiac Risk Stratification   Activity Barriers  Arthritis;Back Problems;Joint Problems;Deconditioning;Muscular Weakness    Cardiac Risk Stratification  High       6 Minute Walk: 6 Minute Walk    Row Name 11/10/17 1427         6 Minute Walk   Phase  Initial     Distance  800 feet     Walk Time  4.12 minutes     # of Rest Breaks  0     MPH  2.2     METS  1.4     RPE  11     VO2 Peak  4.9     Symptoms  Yes (comment)     Comments  stopped walk test 4.12 due to back pain  7/10. Pt subsided with rest     Resting HR  72 bpm     Resting BP  106/70     Resting Oxygen Saturation   95 %     Exercise Oxygen Saturation  during 6 min walk  97 %     Max Ex. HR  101 bpm     Max Ex. BP  116/80        Oxygen Initial Assessment:   Oxygen Re-Evaluation:   Oxygen Discharge (Final Oxygen Re-Evaluation):   Initial Exercise Prescription: Initial Exercise Prescription - 11/10/17 1400      Date of Initial Exercise RX and Referring Provider   Date  11/10/17      Recumbant Bike   Level  1.5    Minutes  10    METs  1.5      NuStep   Level  2    SPM  70    Minutes  10    METs  1.5      Arm Ergometer   Level  2    Minutes  10    METs  1.5      Prescription Details   Frequency (times per week)  3    Duration  Progress to 30 minutes of continuous aerobic without signs/symptoms of physical distress      Intensity   THRR 40-80% of Max Heartrate  58-115    Ratings of Perceived Exertion  11-13    Perceived Dyspnea  0-4      Progression   Progression  Continue to progress workloads to maintain intensity without signs/symptoms of physical distress.      Resistance Training   Training Prescription  Yes    Weight  2lbs    Reps  10-15       Perform Capillary Blood Glucose checks as needed.  Exercise Prescription Changes:  Exercise Prescription Changes    Row Name 11/18/17 1458 11/21/17 1454 12/26/17 1455 01/04/18 1509 01/16/18 1456     Response to Exercise   Blood Pressure (Admit)  118/70  122/88  124/78  102/72  118/82   Blood Pressure (Exercise)  140/78  138/78  122/80  138/78  118/70   Blood Pressure (Exit)  112/78  108/70  122/70  120/70  120/78  Heart Rate (Admit)  97 bpm  95 bpm  84 bpm  98 bpm  92 bpm   Heart Rate (Exercise)  120 bpm  114 bpm  116 bpm  122 bpm  116 bpm   Heart Rate (Exit)  98 bpm  89 bpm  82 bpm  109 bpm  91 bpm   Rating of Perceived Exertion (Exercise)  12  12  12  12  12    Symptoms  none  none  none  none  none    Duration  Progress to 30 minutes of  aerobic without signs/symptoms of physical distress  Progress to 30 minutes of  aerobic without signs/symptoms of physical distress  Progress to 30 minutes of  aerobic without signs/symptoms of physical distress  Progress to 30 minutes of  aerobic without signs/symptoms of physical distress  Progress to 30 minutes of  aerobic without signs/symptoms of physical distress   Intensity  THRR unchanged  THRR unchanged  THRR unchanged  THRR unchanged  THRR unchanged     Progression   Progression  Continue to progress workloads to maintain intensity without signs/symptoms of physical distress.  Continue to progress workloads to maintain intensity without signs/symptoms of physical distress.  Continue to progress workloads to maintain intensity without signs/symptoms of physical distress.  Continue to progress workloads to maintain intensity without signs/symptoms of physical distress.  Continue to progress workloads to maintain intensity without signs/symptoms of physical distress.   Average METs  2.1  2.1  2.5  2.6  3.9     Resistance Training   Training Prescription  Yes  Yes  Yes  No Relaxation day, no weights.  Yes   Weight  2lbs  2lbs  3lbs  -  3lbs   Reps  10-15  10-15  10-15  -  10-15   Time  10 Minutes  10 Minutes  10 Minutes  -  10 Minutes     Interval Training   Interval Training  No  No  No  No  No     Recumbant Bike   Level  1.5  1.5  1.5  2  3    Minutes  10  10  10  10  10    METs  1.9  2  2.5  2.4  -     NuStep   Level  2  2  2  2   -   SPM  70  70  70  70  -   Minutes  10  10  10  10   -   METs  2.2  2.1  -  2.7  -     Arm Ergometer   Level  2  2  2  2  2    Minutes  10  10  10  10  10      Rower   Level  -  -  -  -  4   Watts  -  -  -  -  31   Minutes  -  -  -  -  10   METs  -  -  -  -  4.64     Home Exercise Plan   Plans to continue exercise at  -  -  -  Home (comment) walking  Home (comment) walking   Frequency  -  -  -  Add 3  additional days to program exercise sessions.  Add 3 additional days to program exercise sessions.   Initial Home  Exercises Provided  -  -  -  12/28/17  12/28/17   Row Name 02/10/18 1507             Response to Exercise   Blood Pressure (Admit)  132/80       Blood Pressure (Exercise)  158/84       Blood Pressure (Exit)  128/80       Heart Rate (Admit)  99 bpm       Heart Rate (Exercise)  121 bpm       Heart Rate (Exit)  110 bpm       Rating of Perceived Exertion (Exercise)  12       Symptoms  none       Duration  Progress to 30 minutes of  aerobic without signs/symptoms of physical distress       Intensity  THRR unchanged         Progression   Progression  Continue to progress workloads to maintain intensity without signs/symptoms of physical distress.       Average METs  3.5         Resistance Training   Training Prescription  Yes       Weight  5lbs       Reps  10-15       Time  10 Minutes         Interval Training   Interval Training  No         Recumbant Bike   Level  -       Minutes  -         Arm Ergometer   Level  2       Minutes  10       METs  2.29         Rower   Level  5       Watts  49       Minutes  10       METs  5.2         Track   Laps  6.5 1294 ft       Minutes  6       METs  2.88         Home Exercise Plan   Plans to continue exercise at  Home (comment) walking       Frequency  Add 3 additional days to program exercise sessions.       Initial Home Exercises Provided  12/28/17          Exercise Comments:  Exercise Comments    Row Name 11/18/17 1557 11/21/17 1524 12/26/17 1505 12/28/17 1530 01/04/18 1533   Exercise Comments  Patient tolerated exercise at low intensity without c/o.  Reviewed METs and goals with patient.  Reviewed METs and goals with patient.  Reviewed home exercise guidelines with patient.  Reviewed METs with patient.   Row Name 01/23/18 1500 02/10/18 1515         Exercise Comments  Reviewed METs and goals with  patient.  Reviewed METs and goals with patient.         Exercise Goals and Review:  Exercise Goals    Row Name 11/10/17 1354 11/10/17 1420 11/10/17 1439         Exercise Goals   Increase Physical Activity  Yes  -  -     Intervention  Provide advice, education, support and counseling about physical activity/exercise needs.;Develop an individualized exercise prescription for aerobic and resistive  training based on initial evaluation findings, risk stratification, comorbidities and participant's personal goals.  -  -     Expected Outcomes  Short Term: Attend rehab on a regular basis to increase amount of physical activity.;Long Term: Add in home exercise to make exercise part of routine and to increase amount of physical activity.;Long Term: Exercising regularly at least 3-5 days a week.  -  -     Increase Strength and Stamina  Yes  - increase muscle tone, functional mobility, balance and overall endurance  Yes     Intervention  Provide advice, education, support and counseling about physical activity/exercise needs.;Develop an individualized exercise prescription for aerobic and resistive training based on initial evaluation findings, risk stratification, comorbidities and participant's personal goals.  -  -     Expected Outcomes  Short Term: Increase workloads from initial exercise prescription for resistance, speed, and METs.;Short Term: Perform resistance training exercises routinely during rehab and add in resistance training at home;Long Term: Improve cardiorespiratory fitness, muscular endurance and strength as measured by increased METs and functional capacity ( )  -  -     Able to understand and use rate of perceived exertion (RPE) scale  Yes  -  -     Intervention  Provide education and explanation on how to use RPE scale  -  -     Expected Outcomes  Short Term: Able to use RPE daily in rehab to express subjective intensity level;Long Term:  Able to use RPE to guide intensity level when  exercising independently  -  -     Knowledge and understanding of Target Heart Rate Range (THRR)  Yes  -  -     Intervention  Provide education and explanation of THRR including how the numbers were predicted and where they are located for reference  -  -     Expected Outcomes  Short Term: Able to state/look up THRR;Long Term: Able to use THRR to govern intensity when exercising independently;Short Term: Able to use daily as guideline for intensity in rehab  -  -     Able to check pulse independently  Yes  -  -     Intervention  Provide education and demonstration on how to check pulse in carotid and radial arteries.;Review the importance of being able to check your own pulse for safety during independent exercise  -  -     Expected Outcomes  Short Term: Able to explain why pulse checking is important during independent exercise;Long Term: Able to check pulse independently and accurately  -  -     Understanding of Exercise Prescription  Yes  -  -     Intervention  Provide education, explanation, and written materials on patient's individual exercise prescription  -  -     Expected Outcomes  Short Term: Able to explain program exercise prescription;Long Term: Able to explain home exercise prescription to exercise independently  -  -        Exercise Goals Re-Evaluation : Exercise Goals Re-Evaluation    Row Name 11/18/17 1557 11/21/17 1524 12/26/17 1505 12/28/17 1530 01/23/18 1500     Exercise Goal Re-Evaluation   Exercise Goals Review  Able to understand and use rate of perceived exertion (RPE) scale  Increase Physical Activity  Increase Physical Activity;Able to understand and use rate of perceived exertion (RPE) scale  Increase Physical Activity;Able to understand and use rate of perceived exertion (RPE) scale;Understanding of Exercise Prescription  Increase Physical Activity;Able to  understand and use rate of perceived exertion (RPE) scale;Understanding of Exercise Prescription   Comments   Patient able to understand and use RPE scale appropriately.  Patient is off to a good start with exercise. Tolerated low-moderate intensity exercise at cardiac rehab. Pt states he's walking 15 minutes, 4-5 days/week.  Patient retruned to exercise today after being out x 1 month. Pt states he's been walking 10-15 minutes 3 days/week.  Reviewed METs and goals with patient including THRR, RPE scale, and endpoints for exercise. Pt is walking 13-14 minutes 3 days/week. Discussed increasing walking duration to achieve 30 minutes, and pt is amenable to this.  Patient states he's walking 3 days/week at home in addition to exercise at cardiac rehab.   Expected Outcomes  Increase workloads as tolerated to help achieve personal health and fitness goals.  Increase workloads as tolerated to help achieve personal health and fitness goals.  Patient will increase home exercise duration and workloads at cardiac rehab to help improve cardiorespiratory fitness.  Patient will walk 15 minutes, 2x's/day, 3 days/week to help improve energy and stamina  Progress workloads as tolerated.   Row Name 02/10/18 1515             Exercise Goal Re-Evaluation   Exercise Goals Review  Increase Physical Activity;Able to understand and use rate of perceived exertion (RPE) scale;Understanding of Exercise Prescription;Increase Strength and Stamina       Comments  Patient plans to continue exercise at local gym in the [employer] program and also plans swim as his mode of exercise after CR. Patient plans to exercise 90 minutes 3 days/week. Functional capacity improved 62% as measured by 6MWT, and strength improved 24% as measured by grip strength test.       Expected Outcomes  Patient will continue exercise 90 minutes, at least 3 days/week to maintain health and fitness gains.          Discharge Exercise Prescription (Final Exercise Prescription Changes): Exercise Prescription Changes - 02/10/18 1507      Response to Exercise    Blood Pressure (Admit)  132/80    Blood Pressure (Exercise)  158/84    Blood Pressure (Exit)  128/80    Heart Rate (Admit)  99 bpm    Heart Rate (Exercise)  121 bpm    Heart Rate (Exit)  110 bpm    Rating of Perceived Exertion (Exercise)  12    Symptoms  none    Duration  Progress to 30 minutes of  aerobic without signs/symptoms of physical distress    Intensity  THRR unchanged      Progression   Progression  Continue to progress workloads to maintain intensity without signs/symptoms of physical distress.    Average METs  3.5      Resistance Training   Training Prescription  Yes    Weight  5lbs    Reps  10-15    Time  10 Minutes      Interval Training   Interval Training  No      Recumbant Bike   Level  --    Minutes  --      Arm Ergometer   Level  2    Minutes  10    METs  2.29      Rower   Level  5    Watts  49    Minutes  10    METs  5.2      Track   Laps  6.5   1294  ft   Minutes  6    METs  2.88      Home Exercise Plan   Plans to continue exercise at  Home (comment)   walking   Frequency  Add 3 additional days to program exercise sessions.    Initial Home Exercises Provided  12/28/17       Nutrition:  Target Goals: Understanding of nutrition guidelines, daily intake of sodium 1500mg , cholesterol 200mg , calories 30% from fat and 7% or less from saturated fats, daily to have 5 or more servings of fruits and vegetables.  Biometrics: Pre Biometrics - 11/10/17 1440      Pre Biometrics   Height  5' 8.75" (1.746 m)    Weight  95.6 kg    Waist Circumference  44.5 inches    Hip Circumference  43.5 inches    Waist to Hip Ratio  1.02 %    BMI (Calculated)  31.36    Triceps Skinfold  18 mm    % Body Fat  32 %    Grip Strength  33 kg    Flexibility  --   LBP!   Single Leg Stand  0 seconds      Post Biometrics - 02/10/18 1512       Post  Biometrics   Waist Circumference  45 inches    Hip Circumference  46 inches    Waist to Hip Ratio  0.98 %     Triceps Skinfold  12 mm    Grip Strength  41 kg    Flexibility  --   LBP!   Single Leg Stand  0 seconds       Nutrition Therapy Plan and Nutrition Goals:   Nutrition Assessments:   Nutrition Goals Re-Evaluation:   Nutrition Goals Re-Evaluation:   Nutrition Goals Discharge (Final Nutrition Goals Re-Evaluation):   Psychosocial: Target Goals: Acknowledge presence or absence of significant depression and/or stress, maximize coping skills, provide positive support system. Participant is able to verbalize types and ability to use techniques and skills needed for reducing stress and depression.  Initial Review & Psychosocial Screening: Initial Psych Review & Screening - 11/10/17 1650      Initial Review   Current issues with  None Identified      Family Dynamics   Good Support System?  Yes   Jordan Hebert has his wife daughter's and granddaughter's for support     Barriers   Psychosocial barriers to participate in program  There are no identifiable barriers or psychosocial needs.      Screening Interventions   Interventions  Encouraged to exercise       Quality of Life Scores: Quality of Life - 11/10/17 1453      Quality of Life   Select  Quality of Life      Quality of Life Scores   Health/Function Pre  25.37 %    Socioeconomic Pre  28.33 %    Psych/Spiritual Pre  27.43 %    Family Pre  21.5 %    GLOBAL Pre  25.89 %      Scores of 19 and below usually indicate a poorer quality of life in these areas.  A difference of  2-3 points is a clinically meaningful difference.  A difference of 2-3 points in the total score of the Quality of Life Index has been associated with significant improvement in overall quality of life, self-image, physical symptoms, and general health in studies assessing change in quality of life.  PHQ-9: Recent  Review Flowsheet Data    There is no flowsheet data to display.     Interpretation of Total Score  Total Score Depression Severity:  1-4  = Minimal depression, 5-9 = Mild depression, 10-14 = Moderate depression, 15-19 = Moderately severe depression, 20-27 = Severe depression   Psychosocial Evaluation and Intervention: Psychosocial Evaluation - 11/18/17 1543      Psychosocial Evaluation & Interventions   Interventions  Encouraged to exercise with the program and follow exercise prescription    Comments  no psychosocial needs identified, no interventions necessary. pt enjoys "everything" admits he looks for ways to find pleasure in activities.    Expected Outcomes  pt will exhibit positive outlook with good outlook    Continue Psychosocial Services   No Follow up required       Psychosocial Re-Evaluation: Psychosocial Re-Evaluation    Row Name 12/29/17 1431 01/26/18 1600 02/16/18 1250         Psychosocial Re-Evaluation   Current issues with  None Identified  None Identified  Current Stress Concerns     Comments  -  -  Jordan Hebert has experienced some lower back discomfort in the past few weeks     Interventions  Encouraged to attend Cardiac Rehabilitation for the exercise  Encouraged to attend Cardiac Rehabilitation for the exercise  Encouraged to attend Cardiac Rehabilitation for the exercise     Continue Psychosocial Services   No Follow up required  No Follow up required  No Follow up required       Initial Review   Source of Stress Concerns  -  -  Chronic Illness        Psychosocial Discharge (Final Psychosocial Re-Evaluation): Psychosocial Re-Evaluation - 02/16/18 1250      Psychosocial Re-Evaluation   Current issues with  Current Stress Concerns    Comments  Jordan Hebert has experienced some lower back discomfort in the past few weeks    Interventions  Encouraged to attend Cardiac Rehabilitation for the exercise    Continue Psychosocial Services   No Follow up required      Initial Review   Source of Stress Concerns  Chronic Illness       Vocational Rehabilitation: Provide vocational rehab assistance to qualifying  candidates.   Vocational Rehab Evaluation & Intervention: Vocational Rehab - 11/10/17 1652      Initial Vocational Rehab Evaluation & Intervention   Assessment shows need for Vocational Rehabilitation  No   Jordan Hebert is retired and does not need vocational rehab at this time      Education: Education Goals: Education classes will be provided on a weekly basis, covering required topics. Participant will state understanding/return demonstration of topics presented.  Learning Barriers/Preferences: Learning Barriers/Preferences - 11/10/17 1356      Learning Barriers/Preferences   Learning Barriers  Sight;Hearing    Learning Preferences  Written Material;Video       Education Topics: Count Your Pulse:  -Group instruction provided by verbal instruction, demonstration, patient participation and written materials to support subject.  Instructors address importance of being able to find your pulse and how to count your pulse when at home without a heart monitor.  Patients get hands on experience counting their pulse with staff help and individually.   Heart Attack, Angina, and Risk Factor Modification:  -Group instruction provided by verbal instruction, video, and written materials to support subject.  Instructors address signs and symptoms of angina and heart attacks.    Also discuss risk factors for heart disease and how  to make changes to improve heart health risk factors.   Functional Fitness:  -Group instruction provided by verbal instruction, demonstration, patient participation, and written materials to support subject.  Instructors address safety measures for doing things around the house.  Discuss how to get up and down off the floor, how to pick things up properly, how to safely get out of a chair without assistance, and balance training.   Meditation and Mindfulness:  -Group instruction provided by verbal instruction, patient participation, and written materials to support subject.   Instructor addresses importance of mindfulness and meditation practice to help reduce stress and improve awareness.  Instructor also leads participants through a meditation exercise.    Stretching for Flexibility and Mobility:  -Group instruction provided by verbal instruction, patient participation, and written materials to support subject.  Instructors lead participants through series of stretches that are designed to increase flexibility thus improving mobility.  These stretches are additional exercise for major muscle groups that are typically performed during regular warm up and cool down.   Hands Only CPR:  -Group verbal, video, and participation provides a basic overview of AHA guidelines for community CPR. Role-play of emergencies allow participants the opportunity to practice calling for help and chest compression technique with discussion of AED use.   Hypertension: -Group verbal and written instruction that provides a basic overview of hypertension including the most recent diagnostic guidelines, risk factor reduction with self-care instructions and medication management.    Nutrition I class: Heart Healthy Eating:  -Group instruction provided by PowerPoint slides, verbal discussion, and written materials to support subject matter. The instructor gives an explanation and review of the Therapeutic Lifestyle Changes diet recommendations, which includes a discussion on lipid goals, dietary fat, sodium, fiber, plant stanol/sterol esters, sugar, and the components of a well-balanced, healthy diet.   Nutrition II class: Lifestyle Skills:  -Group instruction provided by PowerPoint slides, verbal discussion, and written materials to support subject matter. The instructor gives an explanation and review of label reading, grocery shopping for heart health, heart healthy recipe modifications, and ways to make healthier choices when eating out.   Diabetes Question & Answer:  -Group  instruction provided by PowerPoint slides, verbal discussion, and written materials to support subject matter. The instructor gives an explanation and review of diabetes co-morbidities, pre- and post-prandial blood glucose goals, pre-exercise blood glucose goals, signs, symptoms, and treatment of hypoglycemia and hyperglycemia, and foot care basics.   Diabetes Blitz:  -Group instruction provided by PowerPoint slides, verbal discussion, and written materials to support subject matter. The instructor gives an explanation and review of the physiology behind type 1 and type 2 diabetes, diabetes medications and rational behind using different medications, pre- and post-prandial blood glucose recommendations and Hemoglobin A1c goals, diabetes diet, and exercise including blood glucose guidelines for exercising safely.    Portion Distortion:  -Group instruction provided by PowerPoint slides, verbal discussion, written materials, and food models to support subject matter. The instructor gives an explanation of serving size versus portion size, changes in portions sizes over the last 20 years, and what consists of a serving from each food group.   Stress Management:  -Group instruction provided by verbal instruction, video, and written materials to support subject matter.  Instructors review role of stress in heart disease and how to cope with stress positively.     Exercising on Your Own:  -Group instruction provided by verbal instruction, power point, and written materials to support subject.  Instructors discuss benefits of exercise,  components of exercise, frequency and intensity of exercise, and end points for exercise.  Also discuss use of nitroglycerin and activating EMS.  Review options of places to exercise outside of rehab.  Review guidelines for sex with heart disease.   Cardiac Drugs I:  -Group instruction provided by verbal instruction and written materials to support subject.  Instructor  reviews cardiac drug classes: antiplatelets, anticoagulants, beta blockers, and statins.  Instructor discusses reasons, side effects, and lifestyle considerations for each drug class.   Cardiac Drugs II:  -Group instruction provided by verbal instruction and written materials to support subject.  Instructor reviews cardiac drug classes: angiotensin converting enzyme inhibitors (ACE-I), angiotensin II receptor blockers (ARBs), nitrates, and calcium channel blockers.  Instructor discusses reasons, side effects, and lifestyle considerations for each drug class.   Anatomy and Physiology of the Circulatory System:  Group verbal and written instruction and models provide basic cardiac anatomy and physiology, with the coronary electrical and arterial systems. Review of: AMI, Angina, Valve disease, Heart Failure, Peripheral Artery Disease, Cardiac Arrhythmia, Pacemakers, and the ICD.   Other Education:  -Group or individual verbal, written, or video instructions that support the educational goals of the cardiac rehab program.   Holiday Eating Survival Tips:  -Group instruction provided by PowerPoint slides, verbal discussion, and written materials to support subject matter. The instructor gives patients tips, tricks, and techniques to help them not only survive but enjoy the holidays despite the onslaught of food that accompanies the holidays.   Knowledge Questionnaire Score: Knowledge Questionnaire Score - 11/10/17 1442      Knowledge Questionnaire Score   Pre Score  20/24       Core Components/Risk Factors/Patient Goals at Admission: Personal Goals and Risk Factors at Admission - 11/10/17 1652      Core Components/Risk Factors/Patient Goals on Admission    Weight Management  Yes;Obesity    Intervention  Weight Management: Develop a combined nutrition and exercise program designed to reach desired caloric intake, while maintaining appropriate intake of nutrient and fiber, sodium and fats,  and appropriate energy expenditure required for the weight goal.;Weight Management: Provide education and appropriate resources to help participant work on and attain dietary goals.;Weight Management/Obesity: Establish reasonable short term and long term weight goals.;Obesity: Provide education and appropriate resources to help participant work on and attain dietary goals.    Admit Weight  210 lb 12.2 oz (95.6 kg)    Goal Weight: Short Term  200 lb (90.7 kg)    Goal Weight: Long Term  190 lb (86.2 kg)    Expected Outcomes  Short Term: Continue to assess and modify interventions until short term weight is achieved;Long Term: Adherence to nutrition and physical activity/exercise program aimed toward attainment of established weight goal;Weight Maintenance: Understanding of the daily nutrition guidelines, which includes 25-35% calories from fat, 7% or less cal from saturated fats, less than 200mg  cholesterol, less than 1.5gm of sodium, & 5 or more servings of fruits and vegetables daily;Weight Loss: Understanding of general recommendations for a balanced deficit meal plan, which promotes 1-2 lb weight loss per week and includes a negative energy balance of [phone] kcal/d;Understanding recommendations for meals to include 15-35% energy as protein, 25-35% energy from fat, 35-60% energy from carbohydrates, less than 200mg  of dietary cholesterol, 20-35 gm of total fiber daily;Understanding of distribution of calorie intake throughout the day with the consumption of 4-5 meals/snacks    Tobacco Cessation  Yes    Number of packs per day  Bob quit 08/15/17  Intervention  Assist the participant in steps to quit. Provide individualized education and counseling about committing to Tobacco Cessation, relapse prevention, and pharmacological support that can be provided by physician.;Education officer, environmental, assist with locating and accessing local/national Quit Smoking programs, and support quit date choice.     Expected Outcomes  Short Term: Will demonstrate readiness to quit, by selecting a quit date.;Short Term: Will quit all tobacco product use, adhering to prevention of relapse plan.;Long Term: Complete abstinence from all tobacco products for at least 12 months from quit date.    Lipids  Yes    Intervention  Provide education and support for participant on nutrition & aerobic/resistive exercise along with prescribed medications to achieve LDL 70mg , HDL >40mg .    Expected Outcomes  Short Term: Participant states understanding of desired cholesterol values and is compliant with medications prescribed. Participant is following exercise prescription and nutrition guidelines.;Long Term: Cholesterol controlled with medications as prescribed, with individualized exercise RX and with personalized nutrition plan. Value goals: LDL < 70mg , HDL > 40 mg.    Stress  Yes    Intervention  Offer individual and/or small group education and counseling on adjustment to heart disease, stress management and health-related lifestyle change. Teach and support self-help strategies.;Refer participants experiencing significant psychosocial distress to appropriate mental health specialists for further evaluation and treatment. When possible, include family members and significant others in education/counseling sessions.    Expected Outcomes  Short Term: Participant demonstrates changes in health-related behavior, relaxation and other stress management skills, ability to obtain effective social support, and compliance with psychotropic medications if prescribed.;Long Term: Emotional wellbeing is indicated by absence of clinically significant psychosocial distress or social isolation.       Core Components/Risk Factors/Patient Goals Review:  Goals and Risk Factor Review    Row Name 11/18/17 1545 12/29/17 1431 01/26/18 1600 02/16/18 1251 02/16/18 1253     Core Components/Risk Factors/Patient Goals Review   Personal Goals Review   Weight Management/Obesity;Tobacco Cessation;Stress;Lipids  Weight Management/Obesity;Tobacco Cessation;Stress;Lipids  Weight Management/Obesity;Tobacco Cessation;Stress;Lipids  -  Weight Management/Obesity;Tobacco Cessation;Stress;Lipids   Review  pt with multiple CAD RF demonstrates eagerness to participate in CR activities.  pt encouraged to engage in all CR aspects for full program benefits. pt denies difficulty with tiobacco cessation and congratulated on his success.  pt personal goals are to increase strength, stamina and muscle tone.    pt with multiple CAD RF demonstrates eagerness to participate in CR activities.  pt encouraged to engage in all CR aspects for full program benefits. pt denies difficulty with tiobacco cessation and congratulated on his success.  pt personal goals are to increase strength, stamina and muscle tone.    pt with multiple CAD RF demonstrates eagerness to participate in CR activities.  pt encouraged to engage in all CR aspects for full program benefits. pt denies difficulty with tiobacco cessation and congratulated on his success.  pt personal goals are to increase strength, stamina and muscle tone.    pt with multiple CAD RF demonstrates eagerness to participate in CR activities.  pt encouraged to engage in all CR aspects for full program benefits. pt denies difficulty with tiobacco cessation and congratulated on his success.  pt personal goals are to increase strength, stamina and muscle tone.    pt with multiple CAD RF demonstrates eagerness to participate in CR activities.  pt encouraged to engage in all CR aspects for full program benefits. pt denies difficulty with tiobacco cessation and congratulated on his success.  pt personal  goals are to increase strength, stamina and muscle tone.     Expected Outcomes  pt will participate in CR exercise, nutrition and lifestyle modification opportunities.   pt will participate in CR exercise, nutrition and lifestyle modification  opportunities.   pt will participate in CR exercise, nutrition and lifestyle modification opportunities.   pt will participate in CR exercise, nutrition and lifestyle modification opportunities. Jordan Hebert will graduate from cardiac rehab on Friday  pt will participate in CR exercise, nutrition and lifestyle modification opportunities. Jordan Hebert will graduate from cardiac rehab on Friday      Core Components/Risk Factors/Patient Goals at Discharge (Final Review):  Goals and Risk Factor Review - 02/16/18 1253      Core Components/Risk Factors/Patient Goals Review   Personal Goals Review  Weight Management/Obesity;Tobacco Cessation;Stress;Lipids    Review  pt with multiple CAD RF demonstrates eagerness to participate in CR activities.  pt encouraged to engage in all CR aspects for full program benefits. pt denies difficulty with tiobacco cessation and congratulated on his success.  pt personal goals are to increase strength, stamina and muscle tone.      Expected Outcomes  pt will participate in CR exercise, nutrition and lifestyle modification opportunities. Jordan Hebert will graduate from cardiac rehab on Friday       ITP Comments: ITP Comments    Row Name 11/10/17 1354 11/18/17 1547 12/29/17 1429 01/26/18 1559 02/16/18 1249   ITP Comments  Dr. Armanda Magic, Medical Director  pt started group exercise program. pt oriented to exercise equipment and safety routine. pt demostrates eagerness to participate in CR program.    30 Day ITP Review. Jordan Hebert has returned to exercise after being out with a foot injury.  30 Day ITP Review. Jordan Hebert has good participation in phase 2 cardiac rehab. Jordan Hebert recently said he hurt his back  30 Day ITP Review. Jordan Hebert has good participation in phase 2 cardiac rehab. Jordan Hebert will graduate from cardiac rehab tomorrow.      Comments: See ITP comments. Jordan Hebert will graduate from cardiac rehab on Friday.Gladstone Lighter, RN,BSN 02/16/2018 12:54 PM

## 2018-02-17 ENCOUNTER — Encounter (HOSPITAL_COMMUNITY): Payer: PPO

## 2018-03-01 ENCOUNTER — Encounter: Payer: Self-pay | Admitting: Physician Assistant

## 2018-03-01 ENCOUNTER — Ambulatory Visit (INDEPENDENT_AMBULATORY_CARE_PROVIDER_SITE_OTHER): Payer: PPO | Admitting: Physician Assistant

## 2018-03-01 VITALS — BP 110/82 | HR 90 | Ht 69.5 in | Wt 220.0 lb

## 2018-03-01 DIAGNOSIS — I25810 Atherosclerosis of coronary artery bypass graft(s) without angina pectoris: Secondary | ICD-10-CM | POA: Diagnosis not present

## 2018-03-01 DIAGNOSIS — E785 Hyperlipidemia, unspecified: Secondary | ICD-10-CM | POA: Diagnosis not present

## 2018-03-01 DIAGNOSIS — Z951 Presence of aortocoronary bypass graft: Secondary | ICD-10-CM

## 2018-03-01 DIAGNOSIS — E039 Hypothyroidism, unspecified: Secondary | ICD-10-CM

## 2018-03-01 DIAGNOSIS — Z952 Presence of prosthetic heart valve: Secondary | ICD-10-CM

## 2018-03-01 MED ORDER — ASPIRIN EC 81 MG PO TBEC: 81.0000 mg | DELAYED_RELEASE_TABLET | Freq: Every day | ORAL | 3 refills | Status: AC

## 2018-03-01 NOTE — Patient Instructions (Addendum)
Medication Instructions:   CONTINUE TAKING ASPIRIN 81MG  ONE TABLET BY MOUTH DAILY  If you need a refill on your cardiac medications before your next appointment, please call your pharmacy.   Lab work: NONE If you have labs (blood work) drawn today and your tests are completely normal, you will receive your results only by: Marland Kitchen. MyChart Message (if you have MyChart) OR . A paper copy in the mail If you have any lab test that is abnormal or we need to change your treatment, we will call you to review the results.  Testing/Procedures: NONE  Follow-Up: [facility], you and your health needs are our priority.  As part of our continuing mission to provide you with exceptional heart care, we have created designated Provider Care Teams.  These Care Teams include your primary Cardiologist (physician) and Advanced Practice Providers (APPs -  Physician Assistants and Nurse Practitioners) who all work together to provide you with the care you need, when you need it. You will need a follow up appointment in 6 months WITH Nanetta BattyJONATHAN BERRY, MD.  Please call our office 2 months in advance to schedule this appointment.

## 2018-03-01 NOTE — Progress Notes (Signed)
Cardiology Office Note    Date:  03/03/2018   ID:  Jordan BerkshireRobert L Reader, DOB 24-Jul-1940, MRN [id number]  PCP:  Kirby FunkGriffin, John, MD  Cardiologist:  Dr. Allyson SabalBerry   Chief Complaint  Patient presents with  . Follow-up    seen for Dr. Allyson SabalBerry.     History of Present Illness:  Jordan Hebert is a 77 y.o. male with PMH of HLD, tobacco abuse, and history of CAD.  Patient was admitted with NSTEMI in May 2019.  Cardiac catheterization revealed severe left main disease with occluded left circumflex and high-grade RCA stenosis with normal LV function.  2D echo showed moderate aortic stenosis.  He eventually underwent CABG x4 with LIMA to diagonal, SVG to ramus intermedius, SVG to left circumflex marginal, and SVG to posterior descending.  He also underwent aortic valve replacement with a 23 mm pericardial Edwards tissue valve.  Repeat echocardiogram obtained on 12/14/2017 showed EF 50 to 55%, mild LVH, grade 1 DD, bioprosthetic aortic valve present and functioning normally, mild MR.  Based on recent phone note, he has gained 12 pounds.  Patient presents today for cardiology office visit.  He denies any recent chest discomfort or shortness of breath.  Although 325 mg aspirin is listed on his medication list, he says since he was released after bypass surgery, he has never taken any high-dose aspirin instead he has always been taking 81 mg aspirin.  Apparently he did not see the high-dose aspirin on his discharge paperwork.  He is more than 3 months out from the bypass surgery, I did not restart on the high-dose aspirin.  Otherwise he has no lower extremity edema, orthopnea or PND.   Past Medical History:  Diagnosis Date  . Back pain   . Colon polyps   . Coronary artery disease   . Degenerative lumbar spinal stenosis   . Depression   . Erectile dysfunction   . Gynecomastia   . High cholesterol   . Hyperthyroidism   . Rheumatic fever   . Seborrhea   . Umbilical hernia     Past Surgical History:    Procedure Laterality Date  . ABDOMINAL AORTOGRAM N/A 08/16/2017   Procedure: ABDOMINAL AORTOGRAM;  Surgeon: Lennette BihariKelly, Thomas A, MD;  [facility];  Service: Cardiovascular;  Laterality: N/A;  . AORTIC VALVE REPLACEMENT N/A 08/17/2017   Procedure: AORTIC VALVE REPLACEMENT (AVR) WITH 23MM INSPIRIS AORTIC VALVE;  Surgeon: Kerin PernaVan Trigt, Peter, MD;  [facility];  Service: Open Heart Surgery;  Laterality: N/A;  . CARDIAC CATHETERIZATION    . CORONARY ANGIOGRAPHY N/A 08/16/2017   Procedure: CORONARY ANGIOGRAPHY;  Surgeon: Lennette BihariKelly, Thomas A, MD;  [facility];  Service: Cardiovascular;  Laterality: N/A;  . CORONARY ARTERY BYPASS GRAFT N/A 08/17/2017   Procedure: CORONARY ARTERY BYPASS GRAFTING (CABG) x 4 , USING LEFT INTERNAL MAMMARY ARTERY AND GREATER SAPHENOUS VEIN HARVESTED ENDOSCOPICALLY;  Surgeon: Kerin PernaVan Trigt, Peter, MD;  [facility];  Service: Open Heart Surgery;  Laterality: N/A;  . CORONARY STENT PLACEMENT    . TEE WITHOUT CARDIOVERSION N/A 08/17/2017   Procedure: TRANSESOPHAGEAL ECHOCARDIOGRAM (TEE);  Surgeon: Donata ClayVan Trigt, Theron AristaPeter, MD;  [facility];  Service: Open Heart Surgery;  Laterality: N/A;    Current Medications: Outpatient Medications Prior to Visit  Medication Sig Dispense Refill  . acetaminophen (TYLENOL) 325 MG tablet Take 2 tablets (650 mg total) by mouth every 6 (six) hours as needed for mild pain.    Marland Kitchen. atorvastatin (LIPITOR) 80 MG  tablet Take 1 tablet (80 mg total) by mouth daily at 6 PM. 30 tablet 1  . cyclobenzaprine (FLEXERIL) 10 MG tablet Take 10 mg by mouth 2 (two) times daily as needed for muscle spasms.     Marland Kitchen levothyroxine (SYNTHROID, LEVOTHROID) 175 MCG tablet Take 175 mcg by mouth daily before breakfast.    . Magnesium 250 MG TABS Take 1 tablet by mouth daily.    . metoprolol tartrate (LOPRESSOR) 25 MG tablet Take 0.5 tablets (12.5 mg total) by mouth 2 (two) times daily. 60 tablet 1  . sertraline (ZOLOFT) 50 MG tablet Take 50 mg by mouth  daily.    . vitamin B-12 (CYANOCOBALAMIN) 1000 MCG tablet Take 1,000 mcg by mouth daily.    Marland Kitchen aspirin 325 MG EC tablet Take 1 tablet (325 mg total) by mouth daily. (Patient taking differently: Take 81 mg by mouth daily. ) 30 tablet 0   No facility-administered medications prior to visit.      Allergies:   Patient has no known allergies.   Social History   Socioeconomic History  . Marital status: Married    Spouse name: Not on file  . Number of children: 2  . Years of education: Not on file  . Highest education level: Not on file  Occupational History  . Occupation: retired  Engineer, production  . Financial resource strain: Not on file  . Food insecurity:    Worry: Never true    Inability: Never true  . Transportation needs:    Medical: No    Non-medical: No  Tobacco Use  . Smoking status: Former Smoker    Types: Cigarettes    Last attempt to quit: 08/15/2017    Years since quitting: 0.5  . Smokeless tobacco: Never Used  Substance and Sexual Activity  . Alcohol use: Yes  . Drug use: Never  . Sexual activity: Not on file  Lifestyle  . Physical activity:    Days per week: 0 days    Minutes per session: 0 min  . Stress: To some extent  Relationships  . Social connections:    Talks on phone: Not on file    Gets together: Not on file    Attends religious service: Not on file    Active member of club or organization: Not on file    Attends meetings of clubs or organizations: Not on file    Relationship status: Not on file  Other Topics Concern  . Not on file  Social History Narrative   He lives with wife.    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   ROS:   Please see the history of present illness.    ROS All other systems reviewed and are negative.   PHYSICAL EXAM:   VS:  BP 110/82   Pulse 90    Ht 5' 9.5" (1.765 m)   Wt 220 lb (99.8 kg)   BMI 32.02 kg/m    GEN: Well nourished, well developed, in no acute distress  HEENT: normal  Neck: no JVD, carotid bruits, or masses Cardiac: RRR; no murmurs, rubs, or gallops,no edema  Respiratory:  clear to auscultation bilaterally, normal work of breathing GI: soft, nontender, nondistended, + BS MS: no deformity or atrophy  Skin: warm and dry, no  rash Neuro:  Alert and Oriented x 3, Strength and sensation are intact Psych: euthymic mood, full affect  Wt Readings from Last 3 Encounters:  03/01/18 220 lb (99.8 kg)  11/23/17 207 lb 9.6 oz (94.2 kg)  11/16/17 210 lb (95.3 kg)      Studies/Labs Reviewed:   EKG:  EKG is ordered today.  The ekg ordered today demonstrates NSR without significantly ST-T wave changes  Recent Labs: 08/18/2017: Magnesium 2.1 08/21/2017: ALT 23 08/24/2017: BUN 17; Creatinine, Ser 0.68; Hemoglobin 9.4; Platelets 187; Potassium 4.5; Sodium 140   Lipid Panel    Component Value Date/Time   CHOL 127 08/16/2017 0642   TRIG 118 08/16/2017 0642   HDL 43 08/16/2017 0642   CHOLHDL 3.0 08/16/2017 0642   VLDL 24 08/16/2017 0642   LDLCALC 60 08/16/2017 0642    Additional studies/ records that were reviewed today include:   Cath 08/16/2017  Ost Cx to Prox Cx lesion is 100% stenosed.  Ost LM to Mid LM lesion is 95% stenosed.  Prox LAD to Mid LAD lesion is 30% stenosed.  Prox RCA to Mid RCA lesion is 70% stenosed.   Severe coronary obstructive disease with critical 95% left main stenosis; 30% stenosis of the LAD after a large septal perforating branch; total occlusion of the ostium of the left circumflex coronary artery; and large dominant RCA with calcification in 65 to 70% proximal stenosis.  There is extensive collateralization of the entire left circumflex vessel via the posterior lateral coronary artery.  Supravalvular aortography demonstrating markedly reduced aortic valve excursion and mild aortic root  dilation suggestion of a difficult aortic valve stenosis.  Despite an arduous attempt at trying to cross the valve, the valve was never able to be crossed to document the aortic stenosis severity and LV function.  Distal aortography revealing somewhat tortuous aorta without definitive aneurysmal dilatation.  RECOMMENDATION: The patient will undergo a stat echo Doppler study upon completion of the catheterization.  The patient has been evaluated by Dr. Donata Clay.  Heparin will be continued until surgery which tentatively is planned for tomorrow for CABG and probable aortic valve replacement.     CABG 08/17/2017 PROCEDURE PERFORMED: 1.  Coronary artery bypass grafting x4 (left internal mammary artery to diagonal, saphenous vein graft to ramus intermedius, saphenous vein graft to circumflex marginal, saphenous vein graft to posterior descending). 2.  Aortic valve replacement with a 23 mm pericardial Edwards tissue valve (Inspiris, serial number [id number]). 3.  Endoscopic harvest of right leg greater saphenous vein.     Echo 12/14/2017 LV EF: 50% -   55% Study Conclusions  - Left ventricle: The cavity size was normal. Wall thickness was   increased in a pattern of mild LVH. Systolic function was normal.   The estimated ejection fraction was in the range of 50% to 55%.   Incoordinate septal motion. Doppler parameters are consistent   with abnormal left ventricular relaxation (grade 1 diastolic   dysfunction). The E/e&' ratio is between 8-15, suggesting   indeterminate LV filling pressure. - Aortic valve: S/p bioprosthetic AVR. No obstruction. Mean   gradient (S): 4 mm Hg. Peak gradient (S): 9 mm Hg. Valve area   (VTI): 1.86 cm^2. Valve area (Vmax): 1.78 cm^2. Valve area   (Vmean): 1.7 cm^2. - Mitral valve: Calcified annulus. Mildly thickened leaflets .   There was mild regurgitation. Valve area by pressure half-time:   2.18 cm^2. - Left atrium: The atrium was normal in size. -  Inferior vena cava:  The vessel was normal in size. The   respirophasic diameter changes were in the normal range (>= 50%),   consistent with normal central venous pressure.  Impressions:  - Compared to a prior study in 07/2017, the LVEF has improved to   50-55%. There has been interval replacement of the aortic valve   with a bovine bioprosthetic with low gradients.  ASSESSMENT:    1. S/P CABG x 4   2. S/P AVR   3. Hyperlipidemia, unspecified hyperlipidemia type   4. Hypothyroidism, unspecified type   5. Coronary artery disease involving coronary bypass graft of native heart without angina pectoris      PLAN:  In order of problems listed above:  1. CAD s/p CABG x 4: Apparently he has never taken any high-dose aspirin since bypass surgery, he has always been on 81 mg low-dose aspirin.  Continue high-dose Lipitor  2. History of AVR: Stable on physical exam  3. Hyperlipidemia: Continue on high-dose Lipitor.  Lipid panel obtained on 08/16/2017 showed very well-controlled cholesterol.  4. Hypothyroidism: On Synthroid, managed by primary care provider.    Medication Adjustments/Labs and Tests Ordered: Current medicines are reviewed at length with the patient today.  Concerns regarding medicines are outlined above.  Medication changes, Labs and Tests ordered today are listed in the Patient Instructions below. Patient Instructions  Medication Instructions:   CONTINUE TAKING ASPIRIN 81MG  ONE TABLET BY MOUTH DAILY  If you need a refill on your cardiac medications before your next appointment, please call your pharmacy.   Lab work: NONE If you have labs (blood work) drawn today and your tests are completely normal, you will receive your results only by: Marland Kitchen MyChart Message (if you have MyChart) OR . A paper copy in the mail If you have any lab test that is abnormal or we need to change your treatment, we will call you to review the  results.  Testing/Procedures: NONE  Follow-Up: At [facility], you and your health needs are our priority.  As part of our continuing mission to provide you with exceptional heart care, we have created designated Provider Care Teams.  These Care Teams include your primary Cardiologist (physician) and Advanced Practice Providers (APPs -  Physician Assistants and Nurse Practitioners) who all work together to provide you with the care you need, when you need it. You will need a follow up appointment in 6 months WITH Nanetta Batty, MD.  Please call our office 2 months in advance to schedule this appointment.      Ramond Dial, [place]  03/03/2018 11:48 PM    [facility] [address] [place], [place], [place]  [address] Phone: [phone]; Fax: [phone]

## 2018-03-03 ENCOUNTER — Encounter: Payer: Self-pay | Admitting: Physician Assistant

## 2018-03-06 ENCOUNTER — Telehealth (HOSPITAL_COMMUNITY): Payer: Self-pay | Admitting: *Deleted

## 2018-03-06 ENCOUNTER — Encounter (HOSPITAL_COMMUNITY): Payer: Self-pay | Admitting: *Deleted

## 2018-03-06 DIAGNOSIS — Z951 Presence of aortocoronary bypass graft: Secondary | ICD-10-CM

## 2018-03-06 DIAGNOSIS — Z952 Presence of prosthetic heart valve: Secondary | ICD-10-CM

## 2018-03-06 DIAGNOSIS — I214 Non-ST elevation (NSTEMI) myocardial infarction: Secondary | ICD-10-CM

## 2018-03-06 NOTE — Progress Notes (Signed)
Discharge Progress Report  Patient Details  Name: Jordan Hebert MRN: [id number] Date of Birth: 1940-10-15 Referring Provider:     Number of Visits: 15  Reason for Discharge:  Early Exit:  Lack of attendance and back problems  Smoking History:  Social History   Tobacco Use  Smoking Status Former Smoker  . Types: Cigarettes  . Last attempt to quit: 08/15/2017  . Years since quitting: 0.6  Smokeless Tobacco Never Used    Diagnosis:  08/17/17 S/P CABG x 4  08/17/17 S/P AVR  08/16/17 NSTEMI (non-ST elevated myocardial infarction) (HCC)  ADL UCSD:   Initial Exercise Prescription:   Discharge Exercise Prescription (Final Exercise Prescription Changes): Exercise Prescription Changes - 02/15/18 1523      Response to Exercise   Blood Pressure (Admit)  122/80    Blood Pressure (Exercise)  128/78    Blood Pressure (Exit)  122/70    Heart Rate (Admit)  102 bpm    Heart Rate (Exercise)  125 bpm    Heart Rate (Exit)  104 bpm    Rating of Perceived Exertion (Exercise)  12    Symptoms  none    Duration  Progress to 30 minutes of  aerobic without signs/symptoms of physical distress    Intensity  THRR unchanged      Progression   Progression  Continue to progress workloads to maintain intensity without signs/symptoms of physical distress.    Average METs  4.4      Resistance Training   Training Prescription  No   Relaxation day, no weights   Weight  --    Reps  --    Time  --      Interval Training   Interval Training  No      Arm Ergometer   Level  2    Minutes  10    METs  --      Rower   Level  4    Watts  24    Minutes  10    METs  4.38      Track   Laps  6.5   1294 ft <[id number] OthMarland KitchLaurell Josephsene535-moOrthopedic Surgical HospKath 16102[facility] WHyde Marland KitchLaurell JosephsenP426-moCommonwealth Eye SurKath 161071JefMMarland KitchLaurell Josephsena71-moChildren'S Hospital Of MichKath 161056Endoscopy Center OWenMarland KitchLaurell Josephsend824-moEssentia Health St JosephsKath 161045[facility] - PsyFairMarland KitchLaurell Josephsenv794-moCampbell County Memorial HospKath 161011Healing Arts SEastMarland KitchLaurell Josephsenp787-moYork General HospKath 16106CMarland KitchLaurell Josephsenr435-moBaylor Scott White Surgicare GrapeKath 161054Saint [facility] Mercy LiTri-Marland KitchLaurell JosephsenC585-moPalo Verde HospKath 161019[place] DwMarland KitchLaurell Josephseni718-moPutnam G IKath 161056Pacific DigestBurke CeMarland KitchLaurell Josephsenn343-moHastings Surgical CenterKath 161075[facility] White OMount CharleMarland KitchLaurell Josephsens71-moNorthwestern Lake Forest HospKath 161053Bayfront HCresMarland KitchLaurell Josephsenb148-moMidmichigan Medical Center-GraKath<MEASUREMENTLaurell Josephs 161078[facility]ulatory Surgery CUpper Greenwood Marland KitchLaurell JosephsenL423-moBanner Peoria Surgery CeKath 161043[place] MemorRoyal Marland KitchLaurell JosephsenC464-moFive River Medical CeKath 161026[facility] MonMarland KitchLaurell Josephsent39-moMclaren Greater LanKath 161067BroadwaWest Loch EsMarland KitchLaurell Josephsent462-moEye Associates Surgery CenterKath 161031Eastern OregonGaMarland KitchLaurell Josephsenn679-moC S Medical LLC Dba [facility] Surgical Kath 161039Mazzocco AmbulatorChristopher CMarland KitchLaurell Josephsenr373-moAdvanced Care Hospital Of MonKath [address]1036[address] BaDeMarland KitchLaurell Josephsenx459-moSpecialty Surgery Center Of San AntKath 161066Avera St AHMarland KitchLaurell Josephseny137-moCornerstone Special[facility],Kath 161041Aria HeJuliMarland KitchLaurell Josephsene868-moCook Medical CeKath 161047St. Mary'S RegionGoeMarland KitchLaurell Josephsenh554-moBayfront Health St PetersKathrynn Speedr Rodney  METs  2.88      Home Exercise Plan   Plans to continue exercise at  Home (comment)   walking   Frequency  Add 3 additional days to program exercise sessions.    Initial Home Exercises Provided  12/28/17       Functional Capacity: 6 Minute Walk    Row Name  02/10/18 1506         6 Minute Walk   Phase  Discharge     Distance  1294 feet     Distance % Change  61.75 %     Distance Feet Change  494 ft     Walk Time  6 minutes     # of Rest Breaks  0     MPH  2.45     METS  2.57     RPE  12     Perceived Dyspnea   0     VO2 Peak  8.98     Symptoms  Yes (comment)     Comments  Rest x1 because of back, rollator given.     Resting HR  75 bpm     Resting BP  132/80     Max Ex. HR  108 bpm     Max Ex. BP  158/84  2 Minute Post BP  128/80        Psychological, QOL, Others - Outcomes: PHQ 2/9: No flowsheet data found.  Quality of Life:   Personal Goals: Goals established at orientation with interventions provided to work toward goal.    Personal Goals Discharge: Goals and Risk Factor Review    Row Name 01/26/18 1600 02/16/18 1251 02/16/18 1253 03/06/18 0919       Core Components/Risk Factors/Patient Goals Review   Personal Goals Review  Weight Management/Obesity;Tobacco Cessation;Stress;Lipids  -  Weight Management/Obesity;Tobacco Cessation;Stress;Lipids  Weight Management/Obesity;Tobacco Cessation;Stress;Lipids    Review  pt with multiple CAD RF demonstrates eagerness to participate in CR activities.  pt encouraged to engage in all CR aspects for full program benefits. pt denies difficulty with tiobacco cessation and congratulated on his success.  pt personal goals are to increase strength, stamina and muscle tone.    pt with multiple CAD RF demonstrates eagerness to participate in CR activities.  pt encouraged to engage in all CR aspects for full program benefits. pt denies difficulty with tiobacco cessation and congratulated on his success.  pt personal goals are to increase strength, stamina and muscle tone.    pt with multiple CAD RF demonstrates eagerness to participate in CR activities.  pt encouraged to engage in all CR aspects for full program benefits. pt denies difficulty with tiobacco cessation and congratulated on his  success.  pt personal goals are to increase strength, stamina and muscle tone.    pt with multiple CAD RF demonstrates eagerness to participate in CR activities.  pt encouraged to engage in all CR aspects for full program benefits. pt denies difficulty with tiobacco cessation and congratulated on his success.  pt personal goals are to increase strength, stamina and muscle tone.      Expected Outcomes  pt will participate in CR exercise, nutrition and lifestyle modification opportunities.   pt will participate in CR exercise, nutrition and lifestyle modification opportunities. Nadine Counts will graduate from cardiac rehab on Friday  pt will participate in CR exercise, nutrition and lifestyle modification opportunities. Nadine Counts will graduate from cardiac rehab on Friday  Nadine Counts has completed cardiac rehab. Patient encouraged to continue  exercise and follow lifestyle modification opportunites       Exercise Goals and Review:   Exercise Goals Re-Evaluation: Exercise Goals Re-Evaluation    Row Name 01/23/18 1500 02/10/18 1515 02/15/18 1548         Exercise Goal Re-Evaluation   Exercise Goals Review  Increase Physical Activity;Able to understand and use rate of perceived exertion (RPE) scale;Understanding of Exercise Prescription  Increase Physical Activity;Able to understand and use rate of perceived exertion (RPE) scale;Understanding of Exercise Prescription;Increase Strength and Stamina  Increase Physical Activity;Able to understand and use rate of perceived exertion (RPE) scale;Understanding of Exercise Prescription;Increase Strength and Stamina     Comments  Patient states he's walking 3 days/week at home in addition to exercise at cardiac rehab.  Patient plans to continue exercise at local gym in the [employer] program and also plans swim as his mode of exercise after CR. Patient plans to exercise 90 minutes 3 days/week. Functional capacity improved 62% as measured by , and strength improved 24% as  measured by grip strength test.  Patient completed the phase 2 CR program and will continue exercise in the [employer] program.     Expected Outcomes  Progress workloads as tolerated.  Patient will continue exercise 90 minutes, at least 3 days/week to maintain health and fitness  gains.  Patient will exercise consistently at the gym to improve cardiorespiratory fitness.        Nutrition & Weight - Outcomes:  Post Biometrics - 02/15/18 1523       Post  Biometrics   Height  5' 8.75" (1.746 m)    Weight  220 lb 3.8 oz (99.9 kg)    Waist Circumference  45 inches    Hip Circumference  46 inches    Waist to Hip Ratio  0.98 %    BMI (Calculated)  32.77    Triceps Skinfold  12 mm    % Body Fat  31.1 %    Grip Strength  41 kg    Flexibility  --   LBP!   Single Leg Stand  0 seconds       Nutrition: Nutrition Therapy & Goals - 03/24/18 0759      Nutrition Therapy   Diet  heart healthy      Personal Nutrition Goals   Nutrition Goal  to be determined      Intervention Plan   Intervention  Prescribe, educate and counsel regarding individualized specific dietary modifications aiming towards targeted core components such as weight, hypertension, lipid management, diabetes, heart failure and other comorbidities.    Expected Outcomes  Short Term Goal: Understand basic principles of dietary content, such as calories, fat, sodium, cholesterol and nutrients.;Long Term Goal: Adherence to prescribed nutrition plan.       Nutrition Discharge: Nutrition Assessments - 03/24/18 0800      MEDFICTS Scores   Pre Score  --   pt did not turn in original medficts pre survey   Post Score  --   pt did not turn in medficts post survey      Education Questionnaire Score:   Nadine Counts attended 15 exercise sessions between 11/10/17- 02/15/18. Nadine Counts had issues with chronic back pain and was out for a period of time due to foot sprain. Bob's attendance was fair to poor. Nadine Counts was not as active due to health  issues while he was in the program. Nadine Counts gained 4.3 kg while he was in the program. Dr Hazle Coca office was called and notified.Gladstone Lighter, RN,BSN 03/30/2018 10:24 AM

## 2018-03-08 DIAGNOSIS — E782 Mixed hyperlipidemia: Secondary | ICD-10-CM | POA: Diagnosis not present

## 2018-03-08 DIAGNOSIS — E039 Hypothyroidism, unspecified: Secondary | ICD-10-CM | POA: Diagnosis not present

## 2018-03-08 DIAGNOSIS — I200 Unstable angina: Secondary | ICD-10-CM | POA: Diagnosis not present

## 2018-03-08 DIAGNOSIS — M170 Bilateral primary osteoarthritis of knee: Secondary | ICD-10-CM | POA: Diagnosis not present

## 2018-03-08 DIAGNOSIS — I2510 Atherosclerotic heart disease of native coronary artery without angina pectoris: Secondary | ICD-10-CM | POA: Diagnosis not present

## 2018-03-15 DIAGNOSIS — H6123 Impacted cerumen, bilateral: Secondary | ICD-10-CM | POA: Diagnosis not present

## 2018-03-16 DIAGNOSIS — H608X3 Other otitis externa, bilateral: Secondary | ICD-10-CM | POA: Diagnosis not present

## 2018-03-24 NOTE — Progress Notes (Signed)
Jordan BerkshireRobert L Garciagarcia 77 y.o. male DOB 18-Jun-1940 MRN [id number]       Nutrition  1. 08/16/17 NSTEMI (non-ST elevated myocardial infarction) (HCC)   2. 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 Back pain   . Colon polyps   . Coronary artery disease   . Degenerative lumbar spinal stenosis   . Depression   . Erectile dysfunction   . Gynecomastia   . High cholesterol   . Hyperthyroidism   . Rheumatic fever   . Seborrhea   . Umbilical hernia    Meds reviewed.    Current Outpatient Medications (Endocrine & Metabolic):  .  levothyroxine (SYNTHROID, LEVOTHROID) 175 MCG tablet, Take 175 mcg by mouth daily before breakfast.  Current Outpatient Medications (Cardiovascular):  .  atorvastatin (LIPITOR) 80 MG tablet, Take 1 tablet (80 mg total) by mouth daily at 6 PM. .  metoprolol tartrate (LOPRESSOR) 25 MG tablet, Take 0.5 tablets (12.5 mg total) by mouth 2 (two) times daily.   Current Outpatient Medications (Analgesics):  .  acetaminophen (TYLENOL) 325 MG tablet, Take 2 tablets (650 mg total) by mouth every 6 (six) hours as needed for mild pain. Marland Kitchen.  aspirin EC 81 MG tablet, Take 1 tablet (81 mg total) by mouth daily.  Current Outpatient Medications (Hematological):  .  vitamin B-12 (CYANOCOBALAMIN) 1000 MCG tablet, Take 1,000 mcg by mouth daily.  Current Outpatient Medications (Other):  .  cyclobenzaprine (FLEXERIL) 10 MG tablet, Take 10 mg by mouth 2 (two) times daily as needed for muscle spasms.  .  Magnesium 250 MG TABS, Take 1 tablet by mouth daily. .  sertraline (ZOLOFT) 50 MG tablet, Take 50 mg by mouth daily.   HT: Ht Readings from Last 1 Encounters:  03/01/18 5' 9.5" (1.765 m)    WT: Wt Readings from Last 5 Encounters:  03/01/18 220 lb (99.8 kg)  02/15/18 220 lb 3.8 oz (99.9 kg)  11/23/17 207 lb 9.6 oz (94.2 kg)  11/16/17 210 lb (95.3 kg)  11/10/17 210 lb 12.2 oz (95.6 kg)     Body mass index is 31.35 kg/m.        Labs:   Lipid Panel     Component Value Date/Time   CHOL 127 08/16/2017 0642   TRIG 118 08/16/2017 0642   HDL 43 08/16/2017 0642   CHOLHDL 3.0 08/16/2017 0642   VLDL 24 08/16/2017 0642   LDLCALC 60 08/16/2017 0642    Lab Results  Component Value Date   HGBA1C 5.5 08/16/2017   CBG (last 3)  No results for input(s): GLUCAP in the last 72 hours.  Nutrition Diagnosis ? Food-and nutrition-related knowledge deficit related to lack of exposure to information as related to diagnosis of: ? CVD   Nutrition Goal(s):  ? To be determined  Plan:  Pt to attend nutrition classes ? Nutrition I ? Nutrition II ? Portion Distortion  Will provide client-centered nutrition education as part of interdisciplinary care.   Monitor and evaluate progress toward nutrition goal with team.  Ross MarcusAubrey Burklin, MS, RD, LDN 03/24/2018 7:59 AM

## 2018-03-24 NOTE — Addendum Note (Signed)
Encounter addended by: Enid SkeensBurkin, Roni Scow, RD on: 03/24/2018 8:04 AM  Actions taken: Visit Navigator Flowsheet section accepted

## 2018-03-24 NOTE — Addendum Note (Signed)
Encounter addended by: Enid SkeensBurkin, Esai Stecklein, RD on: 03/24/2018 7:59 AM  Actions taken: Clinical Note Signed

## 2018-04-11 DIAGNOSIS — H524 Presbyopia: Secondary | ICD-10-CM | POA: Diagnosis not present

## 2018-05-04 DIAGNOSIS — M5136 Other intervertebral disc degeneration, lumbar region: Secondary | ICD-10-CM | POA: Diagnosis not present

## 2018-05-04 DIAGNOSIS — G629 Polyneuropathy, unspecified: Secondary | ICD-10-CM | POA: Diagnosis not present

## 2018-05-04 DIAGNOSIS — I2510 Atherosclerotic heart disease of native coronary artery without angina pectoris: Secondary | ICD-10-CM | POA: Diagnosis not present

## 2018-05-04 DIAGNOSIS — Z1389 Encounter for screening for other disorder: Secondary | ICD-10-CM | POA: Diagnosis not present

## 2018-05-04 DIAGNOSIS — M1A00X0 Idiopathic chronic gout, unspecified site, without tophus (tophi): Secondary | ICD-10-CM | POA: Diagnosis not present

## 2018-05-04 DIAGNOSIS — E039 Hypothyroidism, unspecified: Secondary | ICD-10-CM | POA: Diagnosis not present

## 2018-05-04 DIAGNOSIS — R7301 Impaired fasting glucose: Secondary | ICD-10-CM | POA: Diagnosis not present

## 2018-05-04 DIAGNOSIS — Z0000 Encounter for general adult medical examination without abnormal findings: Secondary | ICD-10-CM | POA: Diagnosis not present

## 2018-05-04 DIAGNOSIS — E782 Mixed hyperlipidemia: Secondary | ICD-10-CM | POA: Diagnosis not present

## 2018-05-04 DIAGNOSIS — M545 Low back pain: Secondary | ICD-10-CM | POA: Diagnosis not present

## 2018-05-04 DIAGNOSIS — N5201 Erectile dysfunction due to arterial insufficiency: Secondary | ICD-10-CM | POA: Diagnosis not present

## 2018-05-10 ENCOUNTER — Other Ambulatory Visit: Payer: Self-pay | Admitting: Physician Assistant

## 2018-08-07 ENCOUNTER — Telehealth: Payer: Self-pay | Admitting: *Deleted

## 2018-08-07 NOTE — Telephone Encounter (Signed)
A message was left,re: follow up appointment. 

## 2018-08-23 IMAGING — DX DG CHEST 1V PORT
1 series · 1 of 1 positions shown · non-contrast
Comparison: 08/15/2017.

CLINICAL DATA: Aortic valve replacement.

EXAM:
PORTABLE CHEST 1 VIEW

[chest ap]
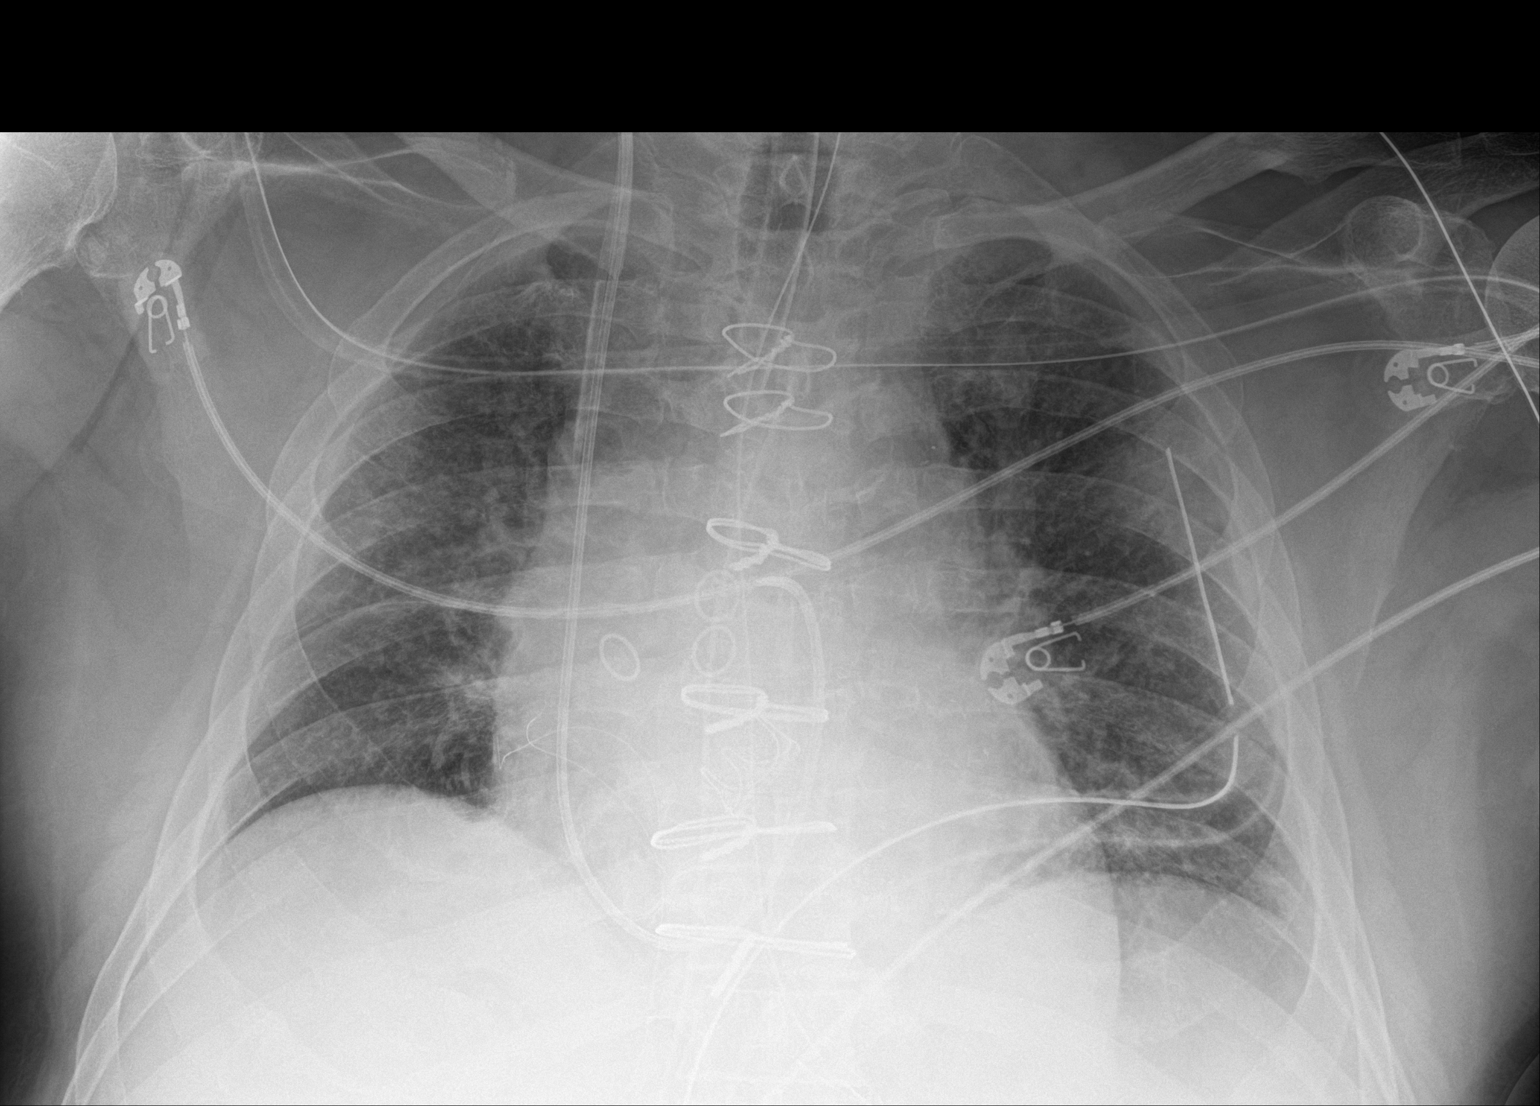

[1 of 1 positions shown; findings below may reference images not displayed]

FINDINGS: Endotracheal tube terminates approximately 2.2 cm above the carina.
Nasogastric tube is followed into the stomach. Right IJ Swan-Ganz
catheter tip projects over the proximal right pulmonary artery.
Mediastinal drain, left chest tube and epicardial pacer wires are in
place.

Cardiomediastinal silhouette is accentuated by AP supine
postoperative technique. Lungs are low in volume with probable mild
vascular crowding. No pneumothorax. Difficult to exclude a tiny left
pleural effusion.
IMPRESSION: 1. Low lung volumes with probable vascular crowding. Difficult to
exclude edema.
2. Difficult to exclude a small left pleural effusion.

## 2018-08-25 IMAGING — DX DG CHEST 1V PORT
1 series · 1 of 1 positions shown · non-contrast
Comparison: 08/18/2017

CLINICAL DATA: Status post AVR

EXAM:
PORTABLE CHEST 1 VIEW

[chest]
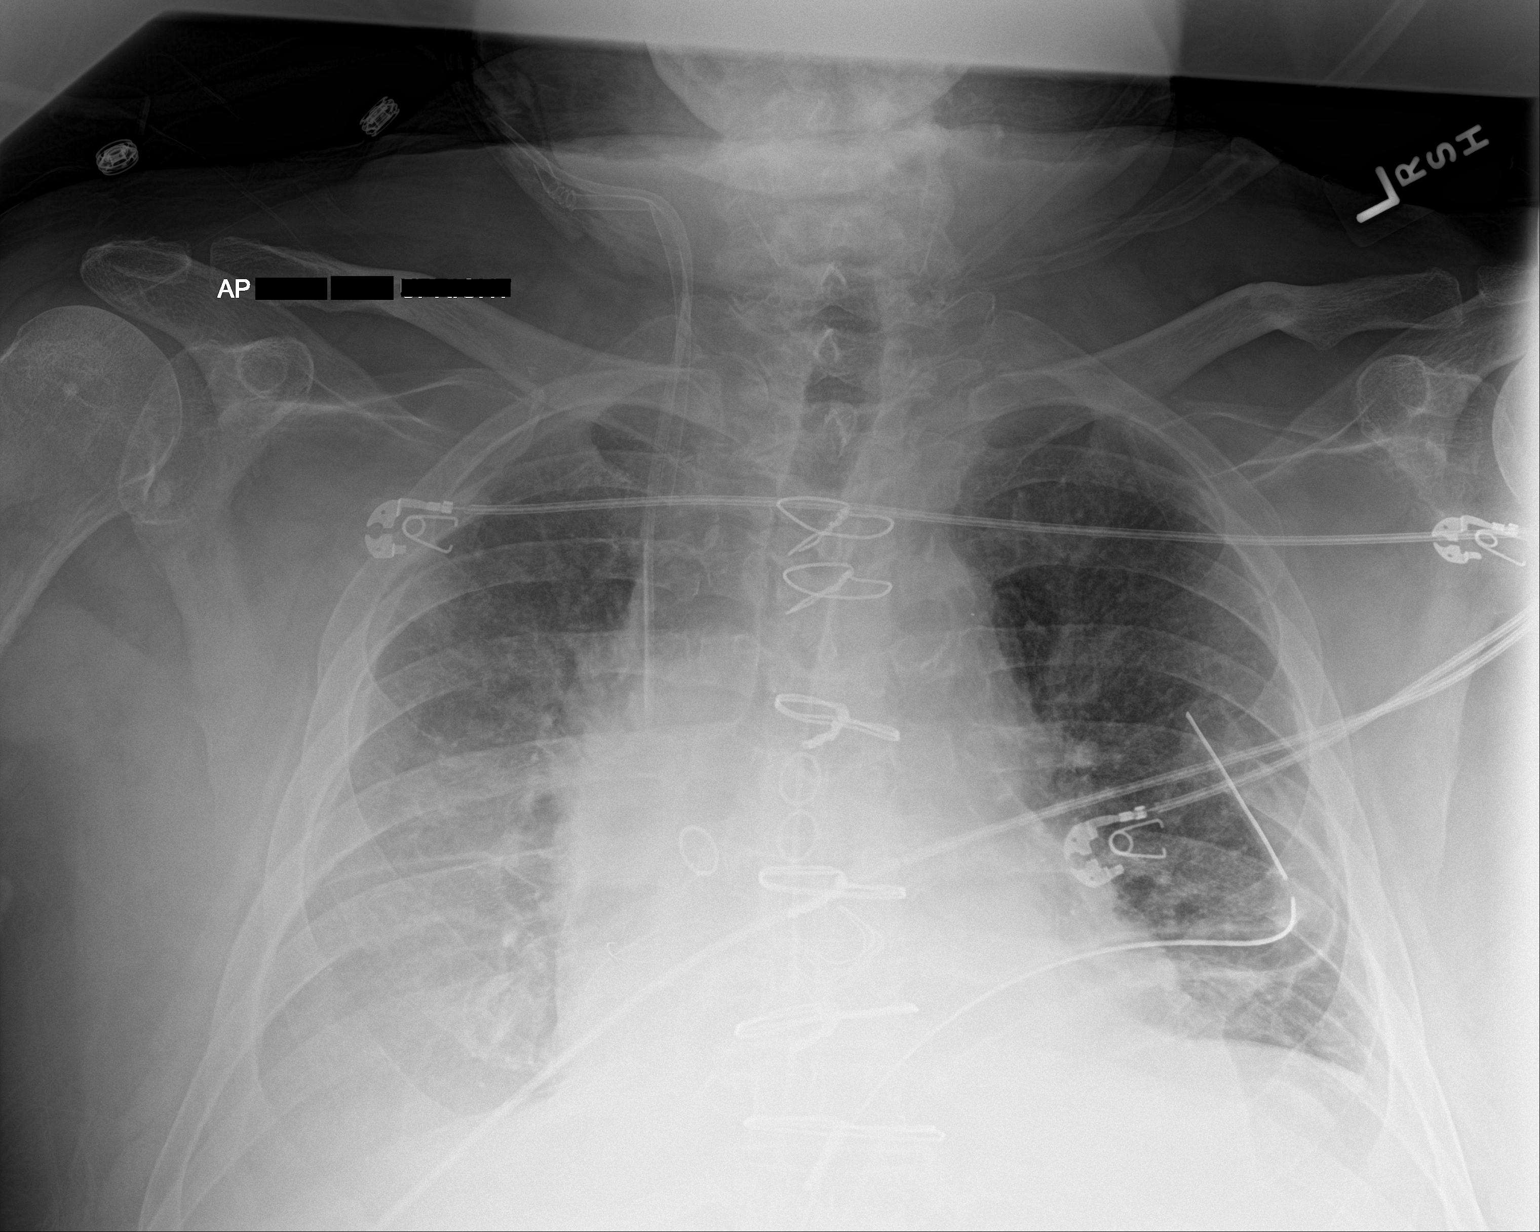

[1 of 1 positions shown; findings below may reference images not displayed]

FINDINGS: Cardiac shadow is stable. Postsurgical changes are noted.
Mediastinal drain and left thoracostomy catheter are again seen in
satisfactory position. The Swan-Ganz catheter has been removed
although a sheath remains in place. Additionally a separate right
jugular central line is noted. Right-sided pleural effusion is again
identified. The effusion on the left appears improved.
IMPRESSION: Persistent right pleural effusion.

Tubes and lines as described.

## 2018-08-26 IMAGING — DX DG CHEST 1V PORT
1 series · 1 of 1 positions shown · non-contrast
Comparison: Radiograph August 19, 2017.

CLINICAL DATA: Status post aortic valve replacement.

EXAM:
PORTABLE CHEST 1 VIEW

[chest ap]
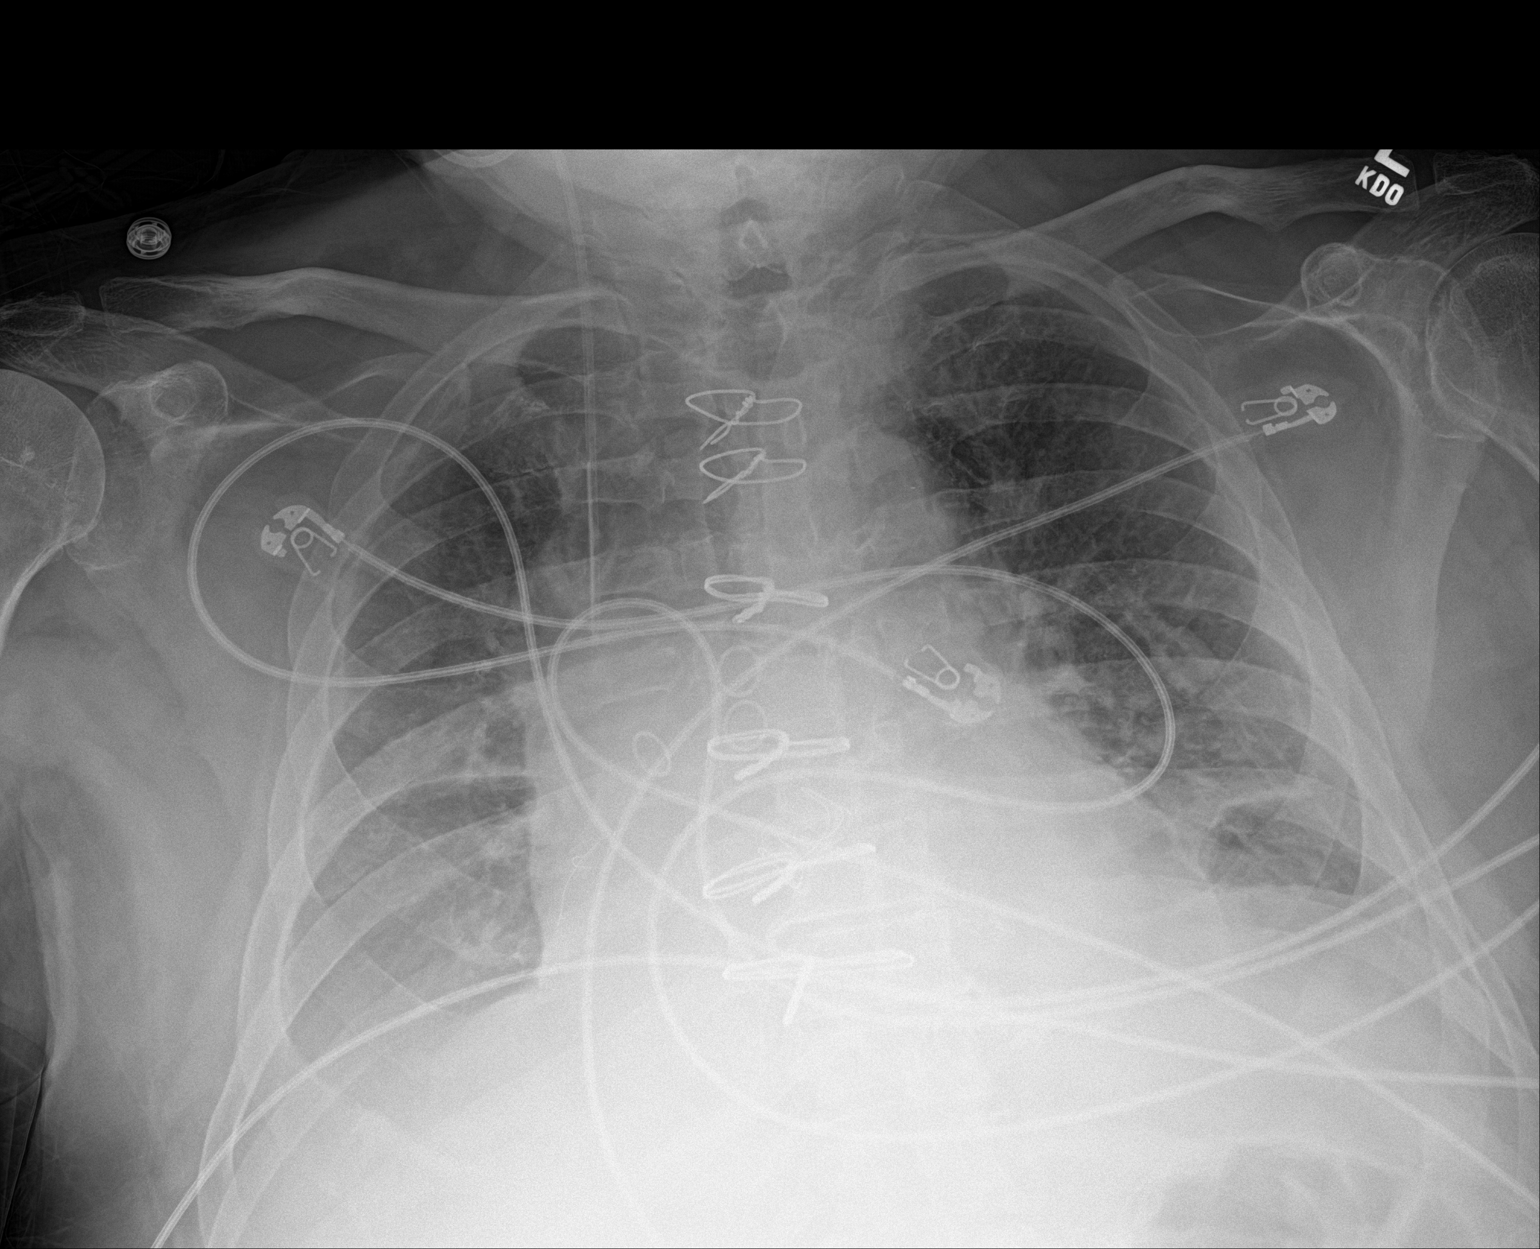

[1 of 1 positions shown; findings below may reference images not displayed]

FINDINGS: Stable cardiomegaly. Status post coronary bypass graft and aortic
valve placement. Right internal jugular catheter is unchanged in
position. Left-sided chest tube has been removed without
pneumothorax. Mild bibasilar subsegmental atelectasis is noted with
probable minimal right pleural effusion. Bony thorax is
unremarkable.
IMPRESSION: Left-sided chest tube has been removed without pneumothorax. Mild
bibasilar subsegmental atelectasis is noted with probable minimal
right pleural effusion.

## 2018-09-19 ENCOUNTER — Other Ambulatory Visit: Payer: Self-pay | Admitting: Physician Assistant

## 2018-10-09 ENCOUNTER — Telehealth: Payer: Self-pay | Admitting: *Deleted

## 2018-10-09 NOTE — Telephone Encounter (Signed)
A message was left, re: follow up visit. 

## 2018-12-07 ENCOUNTER — Encounter (HOSPITAL_COMMUNITY): Payer: Self-pay | Admitting: Cardiovascular Disease

## 2018-12-14 ENCOUNTER — Telehealth: Payer: Self-pay | Admitting: Cardiovascular Disease

## 2018-12-14 NOTE — Telephone Encounter (Signed)
LVM for patient to schedule 6 month followup with Dr. Gwenlyn Found.

## 2018-12-20 ENCOUNTER — Telehealth: Payer: Self-pay

## 2018-12-20 ENCOUNTER — Encounter: Payer: Self-pay | Admitting: Cardiovascular Disease

## 2018-12-20 ENCOUNTER — Other Ambulatory Visit: Payer: Self-pay

## 2018-12-20 ENCOUNTER — Ambulatory Visit (INDEPENDENT_AMBULATORY_CARE_PROVIDER_SITE_OTHER): Payer: PPO | Admitting: Cardiovascular Disease

## 2018-12-20 VITALS — BP 117/76 | HR 78 | Ht 70.5 in | Wt 230.0 lb

## 2018-12-20 DIAGNOSIS — Z720 Tobacco use: Secondary | ICD-10-CM | POA: Diagnosis not present

## 2018-12-20 DIAGNOSIS — I350 Nonrheumatic aortic (valve) stenosis: Secondary | ICD-10-CM | POA: Diagnosis not present

## 2018-12-20 DIAGNOSIS — Z008 Encounter for other general examination: Secondary | ICD-10-CM | POA: Diagnosis not present

## 2018-12-20 DIAGNOSIS — E782 Mixed hyperlipidemia: Secondary | ICD-10-CM

## 2018-12-20 DIAGNOSIS — I214 Non-ST elevation (NSTEMI) myocardial infarction: Secondary | ICD-10-CM | POA: Diagnosis not present

## 2018-12-20 NOTE — Telephone Encounter (Signed)
Updated 9/23 AVS mailed to pt address on file

## 2018-12-20 NOTE — Assessment & Plan Note (Signed)
History of hyperlipidemia on high-dose statin therapy with lipid profile performed 05/04/2018 revealing total cholesterol 167, LDL 77 and HDL of 58.  We will recheck a lipid liver profile in 3 months

## 2018-12-20 NOTE — Patient Instructions (Addendum)
Medication Instructions:  Your physician recommends that you continue on your current medications as directed. Please refer to the Current Medication list given to you today.  If you need a refill on your cardiac medications before your next appointment, please call your pharmacy.   Lab work: Your physician recommends that you return for lab work in 3 months: [place]  If you have labs (blood work) drawn today and your tests are completely normal, you will receive your results only by: Marland Kitchen MyChart Message (if you have MyChart) OR . A paper copy in the mail If you have any lab test that is abnormal or we need to change your treatment, we will call you to review the results.  Testing/Procedures: Your physician has requested that you have an echocardiogram. Echocardiography is a painless test that uses sound waves to create images of your heart. It provides your doctor with information about the size and shape of your heart and how well your heart's chambers and valves are working. This procedure takes approximately one hour. There are no restrictions for this procedure. LOCATION: [facility]: [address], [place], [place] [address] DUE IN 12 MONTHS  Follow-Up: At [facility], you and your health needs are our priority.  As part of our continuing mission to provide you with exceptional heart care, we have created designated Provider Care Teams.  These Care Teams include your primary Cardiologist (physician) and Advanced Practice Providers (APPs -  Physician Assistants and Nurse Practitioners) who all work together to provide you with the care you need, when you need it. You will need a follow up appointment in 12 months with Dr. Quay Burow.  Please call our office 2 months in advance to schedule this appointment. PLEASE HAVE YOUR ECHOCARDIOGRAM COMPLETED BEFORE THIS APPOINTMENT.

## 2018-12-20 NOTE — Progress Notes (Signed)
12/20/2018 Jordan Hebert   1940/07/23  [id number]  Primary Physician Kirby Funk, MD Primary Cardiologist: Runell Gess MD FACP, [place], [place], [place]  HPI:  Jordan Hebert is a 78 y.o.  moderately overweight married Caucasian male father of 2, grandfather of 1 grandchild who I last saw in the office 09/13/2017. He is here for his first post hospital office visit.  His primary care provider is Dr. Kirby Funk.  He has a history of tobacco abuse and hyperlipidemia.  He had stenting 22 years ago.  He was seen in the hospital 08/16/2017 with unstable angina and non-STEMI.  He underwent cardiac catheterization the same day by Dr. Tresa Endo revealing severe left main disease with an occluded circumflex and high-grade RCA stenosis with normal LV function.  Murmur was auscultated.  2D echo revealed moderate aortic stenosis.  The following day he underwent coronary artery bypass grafting by Dr. Maren Beach with LIMA to his diagonal branch, vein to the ramus branch, marginal branch and the PDA.  He also had a 23 mm Edwards pericardial tissue valve implanted by Dr. Maren Beach as well.  His postop course uneventful.,  Since I saw him a year ago he is done well.  He did see Azalee Course, PA-C in the office in December.  He is active and denies chest pain or shortness of breath.  Current Meds  Medication Sig  . acetaminophen (TYLENOL) 325 MG tablet Take 2 tablets (650 mg total) by mouth every 6 (six) hours as needed for mild pain.  Marland Kitchen aspirin EC 81 MG tablet Take 1 tablet (81 mg total) by mouth daily.  Marland Kitchen atorvastatin (LIPITOR) 80 MG tablet Take 1 tablet (80 mg total) by mouth daily at 6 PM.  . cyclobenzaprine (FLEXERIL) 10 MG tablet Take 10 mg by mouth 2 (two) times daily as needed for muscle spasms.   Marland Kitchen levothyroxine (SYNTHROID, LEVOTHROID) 175 MCG tablet Take 175 mcg by mouth daily before breakfast.  . Magnesium 250 MG TABS Take 1 tablet by mouth daily.  . metoprolol tartrate (LOPRESSOR) 25 MG tablet Take 0.5  tablets (12.5 mg total) by mouth 2 (two) times daily.  . sertraline (ZOLOFT) 50 MG tablet Take 50 mg by mouth daily.  . vitamin B-12 (CYANOCOBALAMIN) 1000 MCG tablet Take 1,000 mcg by mouth daily.     No Known Allergies  Social History   Socioeconomic History  . Marital status: Married    Spouse name: Not on file  . Number of children: 2  . Years of education: Not on file  . Highest education level: Not on file  Occupational History  . Occupation: retired  Engineer, production  . Financial resource strain: Not on file  . Food insecurity    Worry: Never true    Inability: Never true  . Transportation needs    Medical: No    Non-medical: No  Tobacco Use  . Smoking status: Former Smoker    Types: Cigarettes    Quit date: 08/15/2017    Years since quitting: 1.3  . Smokeless tobacco: Never Used  Substance and Sexual Activity  . Alcohol use: Yes  . Drug use: Never  . Sexual activity: Not on file  Lifestyle  . Physical activity    Days per week: 0 days    Minutes per session: 0 min  . Stress: To some extent  Relationships  . Social Musician on phone: Not on file    Gets together: Not on file  Attends religious service: Not on file    Active member of club or organization: Not on file    Attends meetings of clubs or organizations: Not on file    Relationship status: Not on file  . Intimate partner violence    Fear of current or ex partner: Not on file    Emotionally abused: Not on file    Physically abused: Not on file    Forced sexual activity: Not on file  Other Topics Concern  . Not on file  Social History Narrative   He lives with wife.    He is retired from [employer])              Review of Systems: General: negative for chills, fever, night sweats or weight changes.  Cardiovascular: negative for chest pain, dyspnea on exertion, edema, orthopnea, palpitations, paroxysmal nocturnal dyspnea or shortness of breath  Dermatological: negative for rash Respiratory: negative for cough or wheezing Urologic: negative for hematuria Abdominal: negative for nausea, vomiting, diarrhea, bright red blood per rectum, melena, or hematemesis Neurologic: negative for visual changes, syncope, or dizziness All other systems reviewed and are otherwise negative except as noted above.    Blood pressure 117/76, pulse 78, height 5' 10.5" (1.791 m), weight 230 lb (104.3 kg), SpO2 96 %.  General appearance: alert and no distress Neck: no adenopathy, no carotid bruit, no JVD, supple, symmetrical, trachea midline and thyroid not enlarged, symmetric, no tenderness/mass/nodules Lungs: clear to auscultation bilaterally Heart: regular rate and rhythm, S1, S2 normal, no murmur, click, rub or gallop Extremities: extremities normal, atraumatic, no cyanosis or edema Pulses: 2+ and symmetric Skin: Skin color, texture, turgor normal. No rashes or lesions Neurologic: Alert and oriented X 3, normal strength and tone. Normal symmetric reflexes. Normal coordination and gait  EKG sinus rhythm at 78 with nonspecific ST and T wave changes.  Personally reviewed this EKG.  ASSESSMENT AND PLAN:   NSTEMI (non-ST elevated myocardial infarction) [facility]) History of non-STEMI 08/16/2017 with cath performed by Dr. Claiborne Billings revealing severe left main disease with occluded circumflex, and high-grade RCA with normal LV function.  He also underwent CABG by Dr. Darcey Nora with a LIMA to his LAD, vein to the ramus branch, marginal branch and to the PDA.  He completed cardiac rehab and is done well since, denies chest pain or shortness of breath.  Aortic valve stenosis History of moderate aortic stenosis status post aortic valve replacement with a 23 mm Edwards pericardial tissue valve by Dr. Darcey Nora at the time of CABG.  Follow-up 2D echo performed 12/14/2017 revealed normal LV function with a well-functioning aortic bioprosthesis.  Hyperlipidemia History of  hyperlipidemia on high-dose statin therapy with lipid profile performed 05/04/2018 revealing total cholesterol 167, LDL 77 and HDL of 58.  We will recheck a lipid liver profile in 3 months  Tobacco abuse Discontinue tobacco abuse at the time of his bypass surgery      Lorretta Harp MD [facility], [facility] 12/20/2018 4:20 PM

## 2018-12-20 NOTE — Assessment & Plan Note (Signed)
History of moderate aortic stenosis status post aortic valve replacement with a 23 mm Edwards pericardial tissue valve by Dr. Darcey Nora at the time of CABG.  Follow-up 2D echo performed 12/14/2017 revealed normal LV function with a well-functioning aortic bioprosthesis.

## 2018-12-20 NOTE — Assessment & Plan Note (Signed)
Discontinue tobacco abuse at the time of his bypass surgery

## 2018-12-20 NOTE — Assessment & Plan Note (Signed)
History of non-STEMI 08/16/2017 with cath performed by Dr. Claiborne Billings revealing severe left main disease with occluded circumflex, and high-grade RCA with normal LV function.  He also underwent CABG by Dr. Darcey Nora with a LIMA to his LAD, vein to the ramus branch, marginal branch and to the PDA.  He completed cardiac rehab and is done well since, denies chest pain or shortness of breath.

## 2019-05-08 DIAGNOSIS — M5136 Other intervertebral disc degeneration, lumbar region: Secondary | ICD-10-CM | POA: Diagnosis not present

## 2019-05-08 DIAGNOSIS — G629 Polyneuropathy, unspecified: Secondary | ICD-10-CM | POA: Diagnosis not present

## 2019-05-08 DIAGNOSIS — R7301 Impaired fasting glucose: Secondary | ICD-10-CM | POA: Diagnosis not present

## 2019-05-08 DIAGNOSIS — E782 Mixed hyperlipidemia: Secondary | ICD-10-CM | POA: Diagnosis not present

## 2019-05-08 DIAGNOSIS — M1000 Idiopathic gout, unspecified site: Secondary | ICD-10-CM | POA: Diagnosis not present

## 2019-05-08 DIAGNOSIS — Z1389 Encounter for screening for other disorder: Secondary | ICD-10-CM | POA: Diagnosis not present

## 2019-05-08 DIAGNOSIS — Z125 Encounter for screening for malignant neoplasm of prostate: Secondary | ICD-10-CM | POA: Diagnosis not present

## 2019-05-08 DIAGNOSIS — I2510 Atherosclerotic heart disease of native coronary artery without angina pectoris: Secondary | ICD-10-CM | POA: Diagnosis not present

## 2019-05-08 DIAGNOSIS — Z0000 Encounter for general adult medical examination without abnormal findings: Secondary | ICD-10-CM | POA: Diagnosis not present

## 2019-05-08 DIAGNOSIS — N3941 Urge incontinence: Secondary | ICD-10-CM | POA: Diagnosis not present

## 2019-05-08 DIAGNOSIS — E039 Hypothyroidism, unspecified: Secondary | ICD-10-CM | POA: Diagnosis not present

## 2019-05-21 DIAGNOSIS — H6123 Impacted cerumen, bilateral: Secondary | ICD-10-CM | POA: Diagnosis not present

## 2019-07-11 DIAGNOSIS — E039 Hypothyroidism, unspecified: Secondary | ICD-10-CM | POA: Diagnosis not present

## 2019-08-13 DIAGNOSIS — H6120 Impacted cerumen, unspecified ear: Secondary | ICD-10-CM | POA: Diagnosis not present

## 2019-12-18 ENCOUNTER — Ambulatory Visit (HOSPITAL_COMMUNITY): Payer: PPO | Attending: Cardiovascular Disease

## 2019-12-18 ENCOUNTER — Other Ambulatory Visit: Payer: Self-pay

## 2019-12-18 DIAGNOSIS — I350 Nonrheumatic aortic (valve) stenosis: Secondary | ICD-10-CM | POA: Insufficient documentation

## 2019-12-18 LAB — ECHOCARDIOGRAM COMPLETE
AV Mean grad: 8 mmHg
AV Peak grad: 11.7 mmHg
Ao pk vel: 1.71 m/s
Area-P 1/2: 3.31 cm2
S' Lateral: 3.7 cm

## 2019-12-18 MED ORDER — PERFLUTREN LIPID MICROSPHERE
1.0000 mL | INTRAVENOUS | Status: AC | PRN
  Administered 2019-12-18: 2 mL via INTRAVENOUS

## 2019-12-26 ENCOUNTER — Other Ambulatory Visit: Payer: Self-pay

## 2019-12-26 DIAGNOSIS — Z952 Presence of prosthetic heart valve: Secondary | ICD-10-CM

## 2019-12-26 DIAGNOSIS — I359 Nonrheumatic aortic valve disorder, unspecified: Secondary | ICD-10-CM

## 2019-12-26 DIAGNOSIS — I350 Nonrheumatic aortic (valve) stenosis: Secondary | ICD-10-CM

## 2020-01-09 ENCOUNTER — Ambulatory Visit (HOSPITAL_COMMUNITY)
Admission: RE | Admit: 2020-01-09 | Payer: PPO | Source: Ambulatory Visit | Attending: Cardiovascular Disease | Admitting: Cardiovascular Disease

## 2020-05-30 DIAGNOSIS — M7021 Olecranon bursitis, right elbow: Secondary | ICD-10-CM | POA: Diagnosis not present

## 2020-06-23 DIAGNOSIS — M1000 Idiopathic gout, unspecified site: Secondary | ICD-10-CM | POA: Diagnosis not present

## 2020-06-23 DIAGNOSIS — G629 Polyneuropathy, unspecified: Secondary | ICD-10-CM | POA: Diagnosis not present

## 2020-06-23 DIAGNOSIS — Z1389 Encounter for screening for other disorder: Secondary | ICD-10-CM | POA: Diagnosis not present

## 2020-06-23 DIAGNOSIS — Z0000 Encounter for general adult medical examination without abnormal findings: Secondary | ICD-10-CM | POA: Diagnosis not present

## 2020-06-23 DIAGNOSIS — R7301 Impaired fasting glucose: Secondary | ICD-10-CM | POA: Diagnosis not present

## 2020-06-23 DIAGNOSIS — E782 Mixed hyperlipidemia: Secondary | ICD-10-CM | POA: Diagnosis not present

## 2020-06-23 DIAGNOSIS — I2510 Atherosclerotic heart disease of native coronary artery without angina pectoris: Secondary | ICD-10-CM | POA: Diagnosis not present

## 2020-06-23 DIAGNOSIS — E039 Hypothyroidism, unspecified: Secondary | ICD-10-CM | POA: Diagnosis not present

## 2021-01-16 DIAGNOSIS — R030 Elevated blood-pressure reading, without diagnosis of hypertension: Secondary | ICD-10-CM | POA: Diagnosis not present

## 2021-01-16 DIAGNOSIS — H6123 Impacted cerumen, bilateral: Secondary | ICD-10-CM | POA: Diagnosis not present

## 2021-01-16 DIAGNOSIS — H60391 Other infective otitis externa, right ear: Secondary | ICD-10-CM | POA: Diagnosis not present

## 2021-01-16 DIAGNOSIS — L729 Follicular cyst of the skin and subcutaneous tissue, unspecified: Secondary | ICD-10-CM | POA: Diagnosis not present

## 2021-03-06 ENCOUNTER — Other Ambulatory Visit: Payer: Self-pay | Admitting: Physical Medicine and Rehabilitation

## 2021-03-06 DIAGNOSIS — G609 Hereditary and idiopathic neuropathy, unspecified: Secondary | ICD-10-CM

## 2021-04-10 ENCOUNTER — Encounter: Payer: Self-pay | Admitting: Physical Medicine and Rehabilitation

## 2021-09-03 DIAGNOSIS — E7800 Pure hypercholesterolemia, unspecified: Secondary | ICD-10-CM | POA: Diagnosis not present

## 2021-09-03 DIAGNOSIS — Z5181 Encounter for therapeutic drug level monitoring: Secondary | ICD-10-CM | POA: Diagnosis not present

## 2021-09-03 DIAGNOSIS — E039 Hypothyroidism, unspecified: Secondary | ICD-10-CM | POA: Diagnosis not present

## 2021-09-03 DIAGNOSIS — R7301 Impaired fasting glucose: Secondary | ICD-10-CM | POA: Diagnosis not present

## 2021-09-14 DIAGNOSIS — H6121 Impacted cerumen, right ear: Secondary | ICD-10-CM | POA: Diagnosis not present

## 2021-09-17 ENCOUNTER — Ambulatory Visit: Payer: Medicare Other | Admitting: Cardiovascular Disease

## 2021-09-21 NOTE — Progress Notes (Signed)
Cardiology Office Note:    Date:  09/22/2021   ID:  Jordan Hebert, DOB June 25, 1940, MRN [id number]  PCP:  Kirby Funk, MD   [facility] Providers Cardiologist:  Nanetta Batty, MD Cardiology APP:  Marcelino Duster, [place] { Referring MD: Kirby Funk, MD   Chief Complaint  Patient presents with   Follow-up    CAD s/p CABG    History of Present Illness:    Jordan Hebert is a 81 y.o. male with a hx of CAD, hyperlipidemia, tobacco abuse  He had stenting over 20 years ago.  He was seen back in the hospital May 2019 with unstable angina and NSTEMI.  Heart catheterization revealed severe left main disease with occluded circumflex and high-grade RCA stenosis with normal LV function.  He was also found to have moderate aortic stenosis.  He underwent CABG and AVR on 08/17/2017 (LIMA-diag, SVG-ramus, SVG-OM, SVG-PDA).   He was last seen in clinic in 11/2018.  He was doing well at that time.  He presents back today for routine follow-up.  He denies chest pain but does complain of shortness of breath. Suspect this is related to prior smoking history. He quit for the third time 2 years ago. No orthopnea or PND. He does not snore. No concern for sleep apnea. SOB is exertional. His wife is bedridden and they live in a 2 story house so he climbs stairs multiple times daily - more than 4.0 METS without angina.    Past Medical History:  Diagnosis Date   Back pain    Colon polyps    Coronary artery disease    Degenerative lumbar spinal stenosis    Depression    Erectile dysfunction    Gynecomastia    High cholesterol    Hyperthyroidism    Rheumatic fever    Seborrhea    Umbilical hernia     Past Surgical History:  Procedure Laterality Date   ABDOMINAL AORTOGRAM N/A 08/16/2017   Procedure: ABDOMINAL AORTOGRAM;  Surgeon: Lennette Bihari, MD;  Location: [facility] INV[facility];  Service: Cardiovascular;  Laterality: N/A;   AORTIC VALVE REPLACEMENT N/A 08/17/2017   Procedure: AORTIC VALVE  REPLACEMENT (AVR) WITH INSPIRIS AORTIC VALVE;  Surgeon: Kerin Perna, MD;  Location: [facility] OR;  Service: Open Heart Surgery;  Laterality: N/A;   CARDIAC CATHETERIZATION     CORONARY ANGIOGRAPHY N/A 08/16/2017   Procedure: CORONARY ANGIOGRAPHY;  Surgeon: Lennette Bihari, MD;  Location: [facility] INVASIVE CV LAB;  Service: Cardiovascular;  Laterality: N/A;   CORONARY ARTERY BYPASS GRAFT N/A 08/17/2017   Procedure: CORONARY ARTERY BYPASS GRAFTING (CABG) x 4 , USING LEFT INTERNAL MAMMARY ARTERY AND GREATER SAPHENOUS VEIN HARVESTED ENDOSCOPICALLY;  Surgeon: Kerin Perna, MD;  Location: [facility] OR;  Service: Open Heart Surgery;  Laterality: N/A;   CORONARY STENT PLACEMENT     TEE WITHOUT CARDIOVERSION N/A 08/17/2017   Procedure: TRANSESOPHAGEAL ECHOCARDIOGRAM (TEE);  Surgeon: Donata Clay, Theron Arista, MD;  Location: [facility] OR;  Service: Open Heart Surgery;  Laterality: N/A;    Current Medications: Current Meds  Medication Sig   acetaminophen (TYLENOL) 325 MG tablet Take 2 tablets (650 mg total) by mouth every 6 (six) hours as needed for mild pain.   aspirin EC 81 MG tablet Take 1 tablet (81 mg total) by mouth daily.   atorvastatin (LIPITOR) 80 MG tablet Take 1 tablet (80 mg total) by mouth daily at 6 PM.   cyclobenzaprine (FLEXERIL) 10 MG tablet Take 10 mg by mouth  2 (two) times daily as needed for muscle spasms.    levothyroxine (SYNTHROID, LEVOTHROID) 175 MCG tablet Take 175 mcg by mouth daily before breakfast.   Magnesium 250 MG TABS Take 1 tablet by mouth daily.   metoprolol tartrate (LOPRESSOR) 25 MG tablet Take 0.5 tablets (12.5 mg total) by mouth 2 (two) times daily.   sertraline (ZOLOFT) 50 MG tablet Take 50 mg by mouth daily.   vitamin B-12 (CYANOCOBALAMIN) 1000 MCG tablet Take 1,000 mcg by mouth daily.     Allergies:   Patient has no known allergies.   Social History   Socioeconomic History   Marital status: Married    Spouse name: Not on file   Number of children: 2   Years of education: Not on  file   Highest education level: Not on file  Occupational History   Occupation: retired  Tobacco Use   Smoking status: Former    Types: Cigarettes    Quit date: 08/15/2017    Years since quitting: 4.1   Smokeless tobacco: Never  Vaping Use   Vaping Use: Not on file  Substance and Sexual Activity   Alcohol use: Yes   Drug use: Never   Sexual activity: Not on file  Other Topics Concern   Not on file  Social History Narrative   He lives with wife.    He is retired from [employer], quantity and [employer])            Social Determinants of Corporate investment banker Strain: Not on file  Food Insecurity: No Food Insecurity (11/10/2017)   Hunger Vital Sign    Worried About Running Out of Food in the Last Year: Never true    Ran Out of Food in the Last Year: Never true  Transportation Needs: No Transportation Needs (11/10/2017)   PRAPARE - Administrator, Civil Service (Medical): No    Lack of Transportation (Non-Medical): No  Physical Activity: Inactive (11/10/2017)   Exercise Vital Sign    Days of Exercise per Week: 0 days    Minutes of Exercise per Session: 0 min  Stress: Stress Concern Present (11/10/2017)   [employer] of Occupational Health - Occupational Stress Questionnaire    Feeling of Stress : To some extent  Social Connections: Not on file     Family History: The patient's family history includes Aneurysm in his sister; Cancer in his brother; Other in his son; Stroke in his mother.  ROS:   Please see the history of present illness.     All other systems reviewed and are negative.  EKGs/Labs/Other Studies Reviewed:    The following studies were reviewed today:  Echo 11/2019 1. Left ventricular ejection fraction, by estimation, is 55 to 60%. The  left ventricle has normal function. The left ventricle has no regional  wall motion abnormalities. Left ventricular diastolic parameters are  consistent with Grade II diastolic  dysfunction  (pseudonormalization).   2. Right ventricular systolic function is mildly reduced. The right  ventricular size is normal.   3. Left atrial size was mildly dilated.   4. The mitral valve is normal in structure. Trivial mitral valve  regurgitation.   5. The aortic valve has been repaired/replaced. Aortic valve  regurgitation is not visualized.   EKG:  EKG is  ordered today.  The ekg ordered today demonstrates sinus rhythm HR 86  Recent Labs: No results found for requested labs within last 365 days.  Recent Lipid Panel    Component  Value Date/Time   CHOL 127 08/16/2017 0642   TRIG 118 08/16/2017 0642   HDL 43 08/16/2017 0642   CHOLHDL 3.0 08/16/2017 0642   VLDL 24 08/16/2017 0642   LDLCALC 60 08/16/2017 0642     Risk Assessment/Calculations:           Physical Exam:    VS:  BP 122/77   Pulse 86   Ht 5\' 10"  (1.778 m)   Wt 234 lb 12.8 oz (106.5 kg)   SpO2 91%   BMI 33.69 kg/m     Wt Readings from Last 3 Encounters:  09/22/21 234 lb 12.8 oz (106.5 kg)  12/20/18 230 lb (104.3 kg)  03/01/18 220 lb (99.8 kg)     GEN:  Well nourished, well developed in no acute distress HEENT: Normal NECK: No JVD; No carotid bruits LYMPHATICS: No lymphadenopathy CARDIAC: RRR, no murmurs, rubs, gallops RESPIRATORY:  occasional wheeze on right ABDOMEN: Soft, non-tender, non-distended MUSCULOSKELETAL:  No edema; No deformity  SKIN: Warm and dry NEUROLOGIC:  Alert and oriented x 3 PSYCHIATRIC:  Normal affect   ASSESSMENT:    1. S/P CABG x 4   2. DOE (dyspnea on exertion)   3. Mixed hyperlipidemia   4. S/P AVR   5. Former smoker   6. Exertional dyspnea    PLAN:    In order of problems listed above:  CAD s/p CABG x4 ASA, metoprolol 12.5 mg twice daily, 80 mg Lipitor Can achieve more than 4.0 METS without angina, does have some DOE.   Hyperlipidemia with LDL goal less than 70 Continue statin - he has been on 80 mg lipitor 09/03/20 Total chol 192 HDL 47 LDL 118 Trig  147 He is amenable to PCSK9i. I will refer to pharmD.    AS s/p AVR - Tissue valve at the time of CABG - Stable valve function on last echo 11/2019 - Due for repeat echo   Former smoker He quit at the time of his CABG   Exertional dyspnea If echo is unrevealing, will refer to pulmonology.   Follow up in 1 year with Dr. Allyson Sabal.   Medication Adjustments/Labs and Tests Ordered: Current medicines are reviewed at length with the patient today.  Concerns regarding medicines are outlined above.  Orders Placed This Encounter  Procedures   AMB Referral to [facility] Lip[facility] Disorders [facility]   EKG 12-Lead   ECHOCARDIOGRAM COMPLETE   No orders of the defined types were placed in this encounter.   Patient Instructions  Medication Instructions:  The current medical regimen is effective;  continue present plan and medications as directed. Please refer to the Current Medication list given to you today.   *If you need a refill on your cardiac medications before your next appointment, please call your pharmacy*  Lab Work:    NONE     If you have labs (blood work) drawn today and your tests are completely normal, you will receive your results only by: MyChart Message (if you have MyChart) OR  A paper copy in the mail If you have any lab test that is abnormal or we need to change your treatment, we will call you to review the results.  Testing/Procedures:  Echocardiogram - Your physician has requested that you have an echocardiogram. Echocardiography is a painless test that uses sound waves to create images of your heart. It provides your doctor with information about the size and shape of your heart and how well your heart's chambers and valves are working. This  procedure takes approximately one hour. There are no restrictions for this procedure.    Follow-Up: Your next appointment:  12 month(s) In Person with Nanetta Batty, MD  AMB REFERRAL TO [facility]IP[facility] FOR PCSK9  Please call our  office 2 months in advance to schedule FOLLOW UP-DR BERRY appointment   At [facility], you and your health needs are our priority.  As part of our continuing mission to provide you with exceptional heart care, we have created designated Provider Care Teams.  These Care Teams include your primary Cardiologist (physician) and Advanced Practice Providers (APPs -  Physician Assistants and Nurse Practitioners) who all work together to provide you with the care you need, when you need it.  Important Information About Sugar           Signed, Marcelino Duster, [place]  09/22/2021 12:35 PM    [facility]

## 2021-09-22 ENCOUNTER — Ambulatory Visit: Payer: Medicare Other | Admitting: Physician Assistant

## 2021-09-22 ENCOUNTER — Encounter: Payer: Self-pay | Admitting: Physician Assistant

## 2021-09-22 VITALS — BP 122/77 | HR 86 | Ht 70.0 in | Wt 234.8 lb

## 2021-09-22 DIAGNOSIS — R0609 Other forms of dyspnea: Secondary | ICD-10-CM

## 2021-09-22 DIAGNOSIS — Z951 Presence of aortocoronary bypass graft: Secondary | ICD-10-CM | POA: Diagnosis not present

## 2021-09-22 DIAGNOSIS — Z87891 Personal history of nicotine dependence: Secondary | ICD-10-CM

## 2021-09-22 DIAGNOSIS — E782 Mixed hyperlipidemia: Secondary | ICD-10-CM | POA: Diagnosis not present

## 2021-09-22 DIAGNOSIS — Z952 Presence of prosthetic heart valve: Secondary | ICD-10-CM | POA: Diagnosis not present

## 2021-10-02 ENCOUNTER — Ambulatory Visit: Payer: Medicare Other

## 2021-10-02 DIAGNOSIS — M1A00X0 Idiopathic chronic gout, unspecified site, without tophus (tophi): Secondary | ICD-10-CM | POA: Insufficient documentation

## 2021-10-02 DIAGNOSIS — N5201 Erectile dysfunction due to arterial insufficiency: Secondary | ICD-10-CM | POA: Insufficient documentation

## 2021-10-02 DIAGNOSIS — N62 Hypertrophy of breast: Secondary | ICD-10-CM | POA: Insufficient documentation

## 2021-10-02 DIAGNOSIS — E039 Hypothyroidism, unspecified: Secondary | ICD-10-CM | POA: Insufficient documentation

## 2021-10-02 DIAGNOSIS — M1000 Idiopathic gout, unspecified site: Secondary | ICD-10-CM | POA: Insufficient documentation

## 2021-10-02 DIAGNOSIS — M5030 Other cervical disc degeneration, unspecified cervical region: Secondary | ICD-10-CM | POA: Insufficient documentation

## 2021-10-02 DIAGNOSIS — L219 Seborrheic dermatitis, unspecified: Secondary | ICD-10-CM | POA: Insufficient documentation

## 2021-10-02 DIAGNOSIS — R7301 Impaired fasting glucose: Secondary | ICD-10-CM | POA: Insufficient documentation

## 2021-10-02 DIAGNOSIS — E7800 Pure hypercholesterolemia, unspecified: Secondary | ICD-10-CM | POA: Insufficient documentation

## 2021-10-02 DIAGNOSIS — I2510 Atherosclerotic heart disease of native coronary artery without angina pectoris: Secondary | ICD-10-CM | POA: Insufficient documentation

## 2021-10-06 ENCOUNTER — Ambulatory Visit (HOSPITAL_COMMUNITY): Payer: Medicare Other

## 2021-10-15 ENCOUNTER — Ambulatory Visit (HOSPITAL_COMMUNITY): Payer: Medicare Other | Attending: Internal Medicine

## 2021-10-15 DIAGNOSIS — Z951 Presence of aortocoronary bypass graft: Secondary | ICD-10-CM | POA: Insufficient documentation

## 2021-10-15 DIAGNOSIS — R0609 Other forms of dyspnea: Secondary | ICD-10-CM | POA: Insufficient documentation

## 2021-10-15 LAB — ECHOCARDIOGRAM COMPLETE
AR max vel: 1.51 cm2
AV Area VTI: 1.52 cm2
AV Area mean vel: 1.45 cm2
AV Mean grad: 8 mmHg
AV Peak grad: 14.4 mmHg
Ao pk vel: 1.9 m/s
Area-P 1/2: 2.19 cm2
S' Lateral: 3 cm

## 2021-10-21 ENCOUNTER — Ambulatory Visit: Payer: Medicare Other | Admitting: Pharmacist

## 2021-10-21 DIAGNOSIS — Z951 Presence of aortocoronary bypass graft: Secondary | ICD-10-CM | POA: Diagnosis not present

## 2021-10-21 DIAGNOSIS — E782 Mixed hyperlipidemia: Secondary | ICD-10-CM

## 2021-10-21 MED ORDER — ROSUVASTATIN CALCIUM 40 MG PO TABS: 40.0000 mg | ORAL_TABLET | Freq: Every day | ORAL | 3 refills | Status: AC

## 2021-10-21 NOTE — Progress Notes (Signed)
Patient ID: AADIN GAUT                 DOB: [birth year]                    MRN: [id number]     HPI: Jordan Hebert is a 81 y.o. male patient of Dr Allyson Sabal referred to lipid clinic by Bettina Gavia, PA. PMH is significant for CAD s/p stenting over 20 years ago, unstable angina and NSTEMI in 07/2017 s/p CABG and AVR, HLD, and former tobacco abuse.  Pt presents today for follow up. Reports some libido issues he attributes to his atorvastatin. Reports having extra tabs at home and having about 20 left, hadn't consistently been taking his statin when his labs were checked last year. Pharmacy hasn't filled rx since June 2022. Only uses colchicine prn for gout flares, hasn't had any recently. Minimal drug interaction with statins due to infrequent nature of use.  Current Medications: atorvastatin 80mg  daily Risk Factors: progressive ASCVD LDL goal: 55mg /dL  Diet: Eats vegetables frequently, limits cheeseburgers to once a week  Exercise: Walks and takes stairs in his home frequently  Family History: Aneurysm in his sister; Cancer in his brother; Stroke in his mother.  Social History: Alcohol use, former tobacco use.  Labs: 09/03/20: TC 192, TG 147, HDL 47, LDL 118  Past Medical History:  Diagnosis Date   Back pain    Colon polyps    Coronary artery disease    Degenerative lumbar spinal stenosis    Depression    Erectile dysfunction    Gynecomastia    High cholesterol    Hyperthyroidism    Rheumatic fever    Seborrhea    Umbilical hernia     Current Outpatient Medications on File Prior to Visit  Medication Sig Dispense Refill   acetaminophen (TYLENOL) 325 MG tablet Take 2 tablets (650 mg total) by mouth every 6 (six) hours as needed for mild pain.     aspirin EC 81 MG tablet Take 1 tablet (81 mg total) by mouth daily. 90 tablet 3   atorvastatin (LIPITOR) 80 MG tablet Take 1 tablet (80 mg total) by mouth daily at 6 PM. 30 tablet 1   baclofen (LIORESAL) 10 MG tablet 1 tablet as needed      clopidogrel (PLAVIX) 75 MG tablet Take 1 tablet by mouth daily.     colchicine (COLCRYS) 0.6 MG tablet 1 tablet     cyclobenzaprine (FLEXERIL) 10 MG tablet Take 1 tablet by mouth 3 (three) times daily.     diclofenac Sodium (VOLTAREN) 1 % GEL 1 application     levothyroxine (SYNTHROID, LEVOTHROID) 175 MCG tablet Take 175 mcg by mouth daily before breakfast.     Magnesium 250 MG TABS Take 1 tablet by mouth daily.     metoprolol tartrate (LOPRESSOR) 25 MG tablet Take 0.5 tablets (12.5 mg total) by mouth 2 (two) times daily. 60 tablet 1   NEOMYCIN-POLYMYXIN-HYDROCORTISONE (CORTISPORIN) 1 % SOLN OTIC solution 4 drops into affected ear     sertraline (ZOLOFT) 50 MG tablet Take 50 mg by mouth daily.     vitamin B-12 (CYANOCOBALAMIN) 1000 MCG tablet Take 1,000 mcg by mouth daily.     No current facility-administered medications on file prior to visit.    No Known Allergies  Assessment/Plan:  1. Hyperlipidemia - LDL 118 last year although pt not adherent to atorvastatin at the time, reporting some libido issues with med. LDL goal < 55 due  to progressive ASCVD. Will stop atorvastatin and start rosuvastatin 40mg  daily. Recheck lipids and LFTs in 6 weeks. Can add on ezetimibe or Repatha if needed at that time depending on how much further LDL lowering is needed. If 118 was more of a baseline from last year, high intensity statin should bring him very close to goal and he may not need additional therapy. Did review injection technique for PCSK9i with pt today if needed in the future which he feels comfortable with.  Quorra Rosene E. Berthold Glace, PharmD, BCACP, CPP [facility] [address] [place], [place] Phone: [phone]; Fax: [phone] 10/21/2021 2:55 PM

## 2021-10-21 NOTE — Patient Instructions (Addendum)
Your LDL cholesterol was 118 last year and your goal is <55  Stop taking atorvastatin  Start taking rosuvastatin (Crestor) - 1 tablet once daily  Recheck fasting labs on Wednesday, September 6th. You can come in any time after 7:30am

## 2021-10-22 ENCOUNTER — Encounter
Payer: Medicare Other | Attending: Physical Medicine and Rehabilitation | Admitting: Physical Medicine and Rehabilitation

## 2021-10-28 DIAGNOSIS — E039 Hypothyroidism, unspecified: Secondary | ICD-10-CM | POA: Diagnosis not present

## 2021-10-28 DIAGNOSIS — E782 Mixed hyperlipidemia: Secondary | ICD-10-CM | POA: Diagnosis not present

## 2021-11-05 ENCOUNTER — Encounter: Payer: Self-pay | Admitting: *Deleted

## 2021-12-02 ENCOUNTER — Other Ambulatory Visit: Payer: Medicare Other

## 2021-12-03 ENCOUNTER — Other Ambulatory Visit: Payer: Medicare Other

## 2021-12-04 DIAGNOSIS — E039 Hypothyroidism, unspecified: Secondary | ICD-10-CM | POA: Diagnosis not present

## 2022-01-05 ENCOUNTER — Encounter: Payer: Self-pay | Admitting: Pharmacist

## 2022-01-20 DIAGNOSIS — E039 Hypothyroidism, unspecified: Secondary | ICD-10-CM | POA: Diagnosis not present

## 2022-01-21 ENCOUNTER — Encounter
Payer: Medicare Other | Attending: Physical Medicine and Rehabilitation | Admitting: Physical Medicine and Rehabilitation

## 2022-02-16 ENCOUNTER — Telehealth: Payer: Self-pay | Admitting: Pharmacist

## 2022-02-16 DIAGNOSIS — E782 Mixed hyperlipidemia: Secondary | ICD-10-CM

## 2022-02-16 NOTE — Telephone Encounter (Signed)
Left message for pt - missed lipid appt the other month. He did have lipid panel checked at [facility] on 10/28/21 and LDL was 69. This was about a week after his atorvastatin was changed to rosuvastatin for better efficacy and tolerability. Ideally need to recheck another lipid panel with him on rosuvastatin longer, and can add on Zetia if needed for LDL goal < 55 due to hx of progressive ASCVD.

## 2022-02-25 NOTE — Telephone Encounter (Signed)
Patient called back, fasting lipid lab has been ordered and patient has been informed. Will go to [facility] [address] [facility] 1st floor.  Reports taking rosuvastatin 40 mg regularly and tolerates it well without any side effects.

## 2022-02-25 NOTE — Telephone Encounter (Signed)
2nd message left for pt.

## 2022-04-15 ENCOUNTER — Telehealth: Payer: Self-pay | Admitting: Cardiovascular Disease
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
## 2022-04-15 NOTE — Telephone Encounter (Signed)
Spoke with patient and informed him that his message will be forwarded to Dr. Kennon Holter nurse. He said to use this number to call him.  [phone].
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
## 2022-05-18 ENCOUNTER — Ambulatory Visit: Payer: Medicare Other | Attending: Cardiovascular Disease | Admitting: Cardiovascular Disease

## 2022-05-18 ENCOUNTER — Encounter: Payer: Self-pay | Admitting: Cardiovascular Disease

## 2022-05-18 VITALS — BP 120/78 | HR 86 | Ht 69.0 in | Wt 245.4 lb

## 2022-05-18 DIAGNOSIS — Z951 Presence of aortocoronary bypass graft: Secondary | ICD-10-CM | POA: Diagnosis not present

## 2022-05-18 DIAGNOSIS — I350 Nonrheumatic aortic (valve) stenosis: Secondary | ICD-10-CM

## 2022-05-18 DIAGNOSIS — E782 Mixed hyperlipidemia: Secondary | ICD-10-CM

## 2022-05-18 NOTE — Assessment & Plan Note (Signed)
History of CAD status post coronary artery bypass grafting by Dr. Darcey Nora after cardiac catheterization performed by Dr. Claiborne Billings in the setting of non-STEMI 08/16/2017 revealed left main three-vessel disease.  He had a LIMA to his diagonal branch, vein to the ramus branch, marginal branch and PDA.  He denies chest pain or shortness of breath.

## 2022-05-18 NOTE — Progress Notes (Signed)
05/18/2022 Jordan Hebert   July 16, 1940  [id number]  Primary Physician Lavone Orn, MD Primary Cardiologist: Lorretta Harp MD FACP, [place], [place], [place]  HPI:  Jordan Hebert is a 82 y.o.   moderately overweight married Caucasian male father of 2, grandfather of 1 grandchild who I last saw in the office 09/13/2017.   His primary care provider was Dr. Lavone Orn, who recently retired.  I last saw him in the office 12/20/2018.  He has a history of tobacco abuse and hyperlipidemia.  He had stenting 22 years ago.  He was seen in the hospital 08/16/2017 with unstable angina and non-STEMI.  He underwent cardiac catheterization the same day by Dr. Claiborne Billings revealing severe left main disease with an occluded circumflex and high-grade RCA stenosis with normal LV function.  Murmur was auscultated.  2D echo revealed moderate aortic stenosis.  The following day he underwent coronary artery bypass grafting by Dr. Darcey Nora with LIMA to his diagonal branch, vein to the ramus branch, marginal branch and the PDA.  He also had a 23 mm Edwards pericardial tissue valve implanted by Dr. Darcey Nora as well.  His postop course uneventful.,   Since I saw him in the office 3-1/2 years ago he continues to do well.  He is completely asymptomatic.  He takes care of his chronically ill wife who is primarily bedbound.  He did have a 2D echocardiogram performed 10/16/2019 revealed preserved LV function with a well-functioning aortic bioprosthesis.   Current Meds  Medication Sig   acetaminophen (TYLENOL) 325 MG tablet Take 2 tablets (650 mg total) by mouth every 6 (six) hours as needed for mild pain.   aspirin EC 81 MG tablet Take 1 tablet (81 mg total) by mouth daily.   baclofen (LIORESAL) 10 MG tablet 1 tablet as needed   clopidogrel (PLAVIX) 75 MG tablet Take 1 tablet by mouth daily.   colchicine (COLCRYS) 0.6 MG tablet 1 tablet   cyclobenzaprine (FLEXERIL) 10 MG tablet Take 1 tablet by mouth 3 (three) times daily.    diclofenac Sodium (VOLTAREN) 1 % GEL 1 application   levothyroxine (SYNTHROID, LEVOTHROID) 175 MCG tablet Take 175 mcg by mouth daily before breakfast.   Magnesium 250 MG TABS Take 1 tablet by mouth daily.   metoprolol tartrate (LOPRESSOR) 25 MG tablet Take 0.5 tablets (12.5 mg total) by mouth 2 (two) times daily.   NEOMYCIN-POLYMYXIN-HYDROCORTISONE (CORTISPORIN) 1 % SOLN OTIC solution 4 drops into affected ear   rosuvastatin (CRESTOR) 40 MG tablet Take 1 tablet (40 mg total) by mouth daily.   sertraline (ZOLOFT) 50 MG tablet Take 50 mg by mouth daily.   vitamin B-12 (CYANOCOBALAMIN) 1000 MCG tablet Take 1,000 mcg by mouth daily.     No Known Allergies  Social History   Socioeconomic History   Marital status: Married    Spouse name: Not on file   Number of children: 2   Years of education: Not on file   Highest education level: Not on file  Occupational History   Occupation: retired  Tobacco Use   Smoking status: Former    Types: Cigarettes    Quit date: 08/15/2017    Years since quitting: 4.7   Smokeless tobacco: Never  Vaping Use   Vaping Use: Not on file  Substance and Sexual Activity   Alcohol use: Yes   Drug use: Never   Sexual activity: Not on file  Other Topics Concern   Not on file  Social History Narrative  He lives with wife.    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[employer])            Social Determinants of Radio broadcast assistant Strain: Not on file  Food Insecurity: No Food Insecurity (11/10/2017)   Hunger Vital Sign    Worried About Running Out of Food in the Last Year: Never true    Ran Out of Food in the Last Year: Never true  Transportation Needs: No Transportation Needs (11/10/2017)   PRAPARE - Hydrologist (Medical): No    Lack of Transportation (Non-Medical): No  Physical Activity: Inactive (11/10/2017)   Exercise Vital Sign    Days of Exercise per Week: 0 days    Minutes of Exercise per Session: 0 min   Stress: Stress Concern Present (11/10/2017)   [place]    Feeling of Stress : To some extent  Social Connections: Not on file  Intimate Partner Violence: Not on file     Review of Systems: General: negative for chills, fever, night sweats or weight changes.  Cardiovascular: negative for chest pain, dyspnea on exertion, edema, orthopnea, palpitations, paroxysmal nocturnal dyspnea or shortness of breath Dermatological: negative for rash Respiratory: negative for cough or wheezing Urologic: negative for hematuria Abdominal: negative for nausea, vomiting, diarrhea, bright red blood per rectum, melena, or hematemesis Neurologic: negative for visual changes, syncope, or dizziness All other systems reviewed and are otherwise negative except as noted above.    Blood pressure 120/78, pulse 86, height 5' 9"$  (1.753 m), weight 245 lb 6.4 oz (111.3 kg), SpO2 93 %.  General appearance: alert and no distress Neck: no adenopathy, no carotid bruit, no JVD, supple, symmetrical, trachea midline, and thyroid not enlarged, symmetric, no tenderness/mass/nodules Lungs: clear to auscultation bilaterally Heart: regular rate and rhythm, S1, S2 normal, no murmur, click, rub or gallop Extremities: extremities normal, atraumatic, no cyanosis or edema Pulses: 2+ and symmetric Skin: Skin color, texture, turgor normal. No rashes or lesions Neurologic: Grossly normal  EKG sinus rhythm 86 with nonspecific ST and T wave changes.  Personally reviewed this EKG.  ASSESSMENT AND PLAN:   Aortic valve stenosis History of aortic valve stenosis status post bioprosthetic AVR at the time of bypass surgery using Edwards 23 mm tissue valve May 2019.  His most recent 2D echo performed 10/15/2021 revealed normal LV systolic function with a well-functioning aortic bioprosthesis.  This will be performed on an annual basis.  S/P CABG x 4 History of CAD status post  coronary artery bypass grafting by Dr. Darcey Nora after cardiac catheterization performed by Dr. Claiborne Billings in the setting of non-STEMI 08/16/2017 revealed left main three-vessel disease.  He had a LIMA to his diagonal branch, vein to the ramus branch, marginal branch and PDA.  He denies chest pain or shortness of breath.  Hyperlipidemia History of hyperlipidemia on statin therapy with lipid profile performed 10/28/2021 revealing total cholesterol 137, LDL 69 and HDL 47.     Lorretta Harp MD [facility] 05/18/2022 2:46 PM

## 2022-05-18 NOTE — Patient Instructions (Signed)
Medication Instructions:  Your physician recommends that you continue on your current medications as directed. Please refer to the Current Medication list given to you today.  *If you need a refill on your cardiac medications before your next appointment, please call your pharmacy*   Testing/Procedures: Your physician has requested that you have an echocardiogram. Echocardiography is a painless test that uses sound waves to create images of your heart. It provides your doctor with information about the size and shape of your heart and how well your heart's chambers and valves are working. This procedure takes approximately one hour. There are no restrictions for this procedure. Please do NOT wear cologne, perfume, aftershave, or lotions (deodorant is allowed). Please arrive 15 minutes prior to your appointment time. This procedure will be done at [address]. To do in July.    Follow-Up: At [facility], you and your health needs are our priority.  As part of our continuing mission to provide you with exceptional heart care, we have created designated Provider Care Teams.  These Care Teams include your primary Cardiologist (physician) and Advanced Practice Providers (APPs -  Physician Assistants and Nurse Practitioners) who all work together to provide you with the care you need, when you need it.  We recommend signing up for the patient portal called "MyChart".  Sign up information is provided on this After Visit Summary.  MyChart is used to connect with patients for Virtual Visits (Telemedicine).  Patients are able to view lab/test results, encounter notes, upcoming appointments, etc.  Non-urgent messages can be sent to your provider as well.   To learn more about what you can do with MyChart, go to [email or web address].    Your next appointment:   12 month(s)  Provider:   Quay Burow, MD

## 2022-05-18 NOTE — Assessment & Plan Note (Signed)
History of aortic valve stenosis status post bioprosthetic AVR at the time of bypass surgery using Edwards 23 mm tissue valve May 2019.  His most recent 2D echo performed 10/15/2021 revealed normal LV systolic function with a well-functioning aortic bioprosthesis.  This will be performed on an annual basis.

## 2022-05-18 NOTE — Assessment & Plan Note (Signed)
History of hyperlipidemia on statin therapy with lipid profile performed 10/28/2021 revealing total cholesterol 137, LDL 69 and HDL 47.

## 2022-05-21 ENCOUNTER — Ambulatory Visit: Payer: Medicare Other | Admitting: Cardiovascular Disease

## 2022-06-28 ENCOUNTER — Other Ambulatory Visit: Payer: Self-pay

## 2022-06-28 ENCOUNTER — Emergency Department (HOSPITAL_BASED_OUTPATIENT_CLINIC_OR_DEPARTMENT_OTHER)
Admission: EM | Admit: 2022-06-28 | Discharge: 2022-06-28 | Disposition: A | Discharge status: Home / Self Care | Payer: Medicare Other | Attending: Emergency Medicine | Admitting: Emergency Medicine

## 2022-06-28 ENCOUNTER — Emergency Department (HOSPITAL_BASED_OUTPATIENT_CLINIC_OR_DEPARTMENT_OTHER): Payer: Medicare Other | Admitting: Radiology

## 2022-06-28 ENCOUNTER — Encounter (HOSPITAL_BASED_OUTPATIENT_CLINIC_OR_DEPARTMENT_OTHER): Payer: Self-pay

## 2022-06-28 DIAGNOSIS — S299XXA Unspecified injury of thorax, initial encounter: Secondary | ICD-10-CM | POA: Insufficient documentation

## 2022-06-28 DIAGNOSIS — X58XXXA Exposure to other specified factors, initial encounter: Secondary | ICD-10-CM | POA: Diagnosis not present

## 2022-06-28 DIAGNOSIS — R079 Chest pain, unspecified: Secondary | ICD-10-CM | POA: Diagnosis not present

## 2022-06-28 DIAGNOSIS — S298XXA Other specified injuries of thorax, initial encounter: Secondary | ICD-10-CM

## 2022-06-28 DIAGNOSIS — R0789 Other chest pain: Secondary | ICD-10-CM | POA: Diagnosis not present

## 2022-06-28 LAB — CBC
HCT: 46.9 % (ref 39.0–52.0)
Hemoglobin: 15.1 g/dL (ref 13.0–17.0)
MCH: 32.9 pg (ref 26.0–34.0)
MCHC: 32.2 g/dL (ref 30.0–36.0)
MCV: 102.2 fL — ABNORMAL HIGH (ref 80.0–100.0)
Platelets: 117 10*3/uL — ABNORMAL LOW (ref 150–400)
RBC: 4.59 MIL/uL (ref 4.22–5.81)
RDW: 14.2 % (ref 11.5–15.5)
WBC: 5.9 10*3/uL (ref 4.0–10.5)
nRBC: 0 % (ref 0.0–0.2)

## 2022-06-28 LAB — BASIC METABOLIC PANEL
Anion gap: 7 (ref 5–15)
BUN: 19 mg/dL (ref 8–23)
CO2: 32 mmol/L (ref 22–32)
Calcium: 9.8 mg/dL (ref 8.9–10.3)
Chloride: 101 mmol/L (ref 98–111)
Creatinine, Ser: 0.89 mg/dL (ref 0.61–1.24)
GFR, Estimated: >60 mL/min (ref >60)
Glucose, Bld: 98 mg/dL (ref 70–99)
Potassium: 4.6 mmol/L (ref 3.5–5.1)
Sodium: 140 mmol/L (ref 135–145)

## 2022-06-28 LAB — TROPONIN I (HIGH SENSITIVITY): Troponin I (High Sensitivity): 9 ng/L (ref <18)

## 2022-06-28 MED ORDER — OXYCODONE-ACETAMINOPHEN 5-325 MG PO TABS
1.0000 | ORAL_TABLET | Freq: Once | ORAL | Status: AC
  Administered 2022-06-28: 1 via ORAL
  Filled 2022-06-28: qty 1

## 2022-06-28 MED ORDER — OXYCODONE-ACETAMINOPHEN 5-325 MG PO TABS: 1.0000 | ORAL_TABLET | Freq: Three times a day (TID) | ORAL | 0 refills | Status: DC | PRN

## 2022-06-28 NOTE — ED Notes (Signed)
Discharge instructions, medications and follow up care reviewed and explained. Pt verbalized understanding and had no further questions.   

## 2022-06-28 NOTE — ED Triage Notes (Signed)
Patient here POV from Home.  Endorses Left Sided CP that began 2 Weeks ago. Worsened since and worse with movement. No SOB. No N/V. No fever.   NAD Noted during Triage. A&Ox4. GCS 15. BIB Wheelchair.

## 2022-06-28 NOTE — ED Provider Notes (Signed)
[facility] Provider Note   CSN: [id number] Arrival date & time: 06/28/22  1236     History  Chief Complaint  Patient presents with   Chest Pain    Jordan Hebert is a 82 y.o. male.   Chest Pain Patient presents with left-sided chest pain.  Began around 2 weeks ago after falling onto his left side.  States has had pain since.  Worse with certain movements.  No difficulty breathing but does have pain with breathing.  No hemoptysis.  No abdominal pain. History of a CABG and aortic valve replacement.    Past Medical History:  Diagnosis Date   Back pain    Colon polyps    Coronary artery disease    Degenerative lumbar spinal stenosis    Depression    Erectile dysfunction    Gynecomastia    High cholesterol    Hyperthyroidism    Rheumatic fever    Seborrhea    Umbilical hernia     Home Medications Prior to Admission medications   Medication Sig Start Date End Date Taking? Authorizing Provider  oxyCODONE-acetaminophen (PERCOCET/ROXICET) 5-325 MG tablet Take 1-2 tablets by mouth every 8 (eight) hours as needed for severe pain. 06/28/22  Yes Davonna Belling, MD  acetaminophen (TYLENOL) 325 MG tablet Take 2 tablets (650 mg total) by mouth every 6 (six) hours as needed for mild pain. 08/24/17   Elgie Collard, PA-C  aspirin EC 81 MG tablet Take 1 tablet (81 mg total) by mouth daily. 03/01/18   Almyra Deforest, PA  baclofen (LIORESAL) 10 MG tablet 1 tablet as needed 04/22/21   [provider]  clopidogrel (PLAVIX) 75 MG tablet Take 1 tablet by mouth daily.    [provider]  colchicine (COLCRYS) 0.6 MG tablet 1 tablet    [provider]  cyclobenzaprine (FLEXERIL) 10 MG tablet Take 1 tablet by mouth 3 (three) times daily.    [provider]  diclofenac Sodium (VOLTAREN) 1 % GEL 1 application 0000000   [provider]  levothyroxine (SYNTHROID, LEVOTHROID) 175 MCG tablet Take 175 mcg by mouth daily  before breakfast.    [provider]  Magnesium 250 MG TABS Take 1 tablet by mouth daily.    [provider]  metoprolol tartrate (LOPRESSOR) 25 MG tablet Take 0.5 tablets (12.5 mg total) by mouth 2 (two) times daily. 08/24/17   Elgie Collard, PA-C  NEOMYCIN-POLYMYXIN-HYDROCORTISONE (CORTISPORIN) 1 % SOLN OTIC solution 4 drops into affected ear    [provider]  rosuvastatin (CRESTOR) 40 MG tablet Take 1 tablet (40 mg total) by mouth daily. 10/21/21   Lorretta Harp, MD  sertraline (ZOLOFT) 50 MG tablet Take 50 mg by mouth daily.    [provider]  vitamin B-12 (CYANOCOBALAMIN) 1000 MCG tablet Take 1,000 mcg by mouth daily.    [provider]      Allergies    Patient has no known allergies.    Review of Systems   Review of Systems  Cardiovascular:  Positive for chest pain.    Physical Exam Updated Vital Signs BP 123/71   Pulse 76   Temp 97.8 F (36.6 C) (Oral)   Resp 11   Ht 5\' 9"  (1.753 m)   Wt 111.3 kg   SpO2 96%   BMI 36.24 kg/m  Physical Exam Vitals reviewed.  Pulmonary:     Effort: No tachypnea.     Breath sounds: No rales.  Chest:  Chest wall: Tenderness present.     Comments: Tenderness to the left anterior chest wall without crepitance or deformity. Musculoskeletal:     Cervical back: Neck supple.     Right lower leg: No tenderness.     Left lower leg: No tenderness.  Skin:    General: Skin is warm.  Neurological:     Mental Status: He is alert.     ED Results / Procedures / Treatments   Labs (all labs ordered are listed, but only abnormal results are displayed) Labs Reviewed  CBC - Abnormal; Notable for the following components:      Result Value   MCV 102.2 (*)    Platelets 117 (*)    All other components within normal limits  BASIC METABOLIC PANEL  TROPONIN I (HIGH SENSITIVITY)  TROPONIN I (HIGH SENSITIVITY)    EKG EKG Interpretation  Date/Time:  Monday June 28 2022 12:51:26  EDT Ventricular Rate:  78 PR Interval:  152 QRS Duration: 102 QT Interval:  382 QTC Calculation: 435 R Axis:   -1 Text Interpretation: Normal sinus rhythm Anterior infarct , age undetermined ST & T wave abnormality, consider lateral ischemia Abnormal ECG When compared with ECG of 18-Aug-2017 06:58, Anterior infarct is now Present ST no longer depressed in Anterolateral leads T wave inversion less evident in Anterolateral leads Confirmed by Davonna Belling [id number]) on 06/28/2022 4:34:59 PM  Radiology DG Chest 2 View  Result Date: 06/28/2022 CLINICAL DATA:  Chest pain, worse with movements. EXAM: CHEST - 2 VIEW COMPARISON:  09/21/2017 and older exams. FINDINGS: Stable changes from prior cardiac surgery and aortic valve replacement. Cardiac silhouette is normal in size. No mediastinal or hilar masses. No evidence of adenopathy. Clear lungs.  No pleural effusion or pneumothorax. Skeletal structures are intact. IMPRESSION: No acute cardiopulmonary disease. Electronically Signed   By: Lajean Manes M.D.   On: 06/28/2022 13:23    Procedures Procedures    Medications Ordered in ED Medications  oxyCODONE-acetaminophen (PERCOCET/ROXICET) 5-325 MG per tablet 1 tablet (has no administration in time range)    ED Course/ Medical Decision Making/ A&P                             Medical Decision Making Amount and/or Complexity of Data Reviewed Radiology: ordered.   Patient with tenderness to left anterior chest wall after fall.  No abdominal tenderness.  EKG reassuring.  Chest x-ray independently interpreted shows no visible fractures, however I think blunt chest trauma is the most likely cause of pain.  Doubt cardiac ischemia as a cause.  No pneumothorax or effusion seen.  Will treat symptomatic with pain control.  Outpatient follow-up as needed.  Discharge home.        Final Clinical Impression(s) / ED Diagnoses Final diagnoses:  Blunt chest trauma, initial encounter    Rx / DC  Orders ED Discharge Orders          Ordered    oxyCODONE-acetaminophen (PERCOCET/ROXICET) 5-325 MG tablet  Every 8 hours PRN        06/28/22 1652              Davonna Belling, MD 06/28/22 1652

## 2022-10-04 ENCOUNTER — Other Ambulatory Visit (HOSPITAL_COMMUNITY): Payer: Medicare Other

## 2022-10-12 ENCOUNTER — Ambulatory Visit (HOSPITAL_COMMUNITY): Payer: Medicare Other | Attending: Cardiovascular Disease

## 2022-10-22 ENCOUNTER — Other Ambulatory Visit (HOSPITAL_COMMUNITY): Payer: Medicare Other

## 2022-11-22 ENCOUNTER — Ambulatory Visit (HOSPITAL_COMMUNITY): Payer: Medicare Other

## 2023-01-04 ENCOUNTER — Ambulatory Visit (HOSPITAL_COMMUNITY): Payer: Medicare Other | Attending: Cardiovascular Disease

## 2023-01-04 DIAGNOSIS — I350 Nonrheumatic aortic (valve) stenosis: Secondary | ICD-10-CM | POA: Diagnosis not present

## 2023-01-04 DIAGNOSIS — E782 Mixed hyperlipidemia: Secondary | ICD-10-CM | POA: Insufficient documentation

## 2023-01-04 DIAGNOSIS — Z951 Presence of aortocoronary bypass graft: Secondary | ICD-10-CM | POA: Insufficient documentation

## 2023-01-04 LAB — ECHOCARDIOGRAM COMPLETE
AV Mean grad: 14.2 mm[Hg]
AV Peak grad: 25.3 mm[Hg]
Ao pk vel: 2.51 m/s
Area-P 1/2: 2.83 cm2
S' Lateral: 3.3 cm

## 2023-01-06 ENCOUNTER — Other Ambulatory Visit (HOSPITAL_COMMUNITY): Payer: Self-pay

## 2023-01-06 DIAGNOSIS — I214 Non-ST elevation (NSTEMI) myocardial infarction: Secondary | ICD-10-CM

## 2023-01-06 DIAGNOSIS — L7211 Pilar cyst: Secondary | ICD-10-CM | POA: Diagnosis not present

## 2023-01-06 DIAGNOSIS — Z951 Presence of aortocoronary bypass graft: Secondary | ICD-10-CM

## 2023-01-06 DIAGNOSIS — Z952 Presence of prosthetic heart valve: Secondary | ICD-10-CM

## 2023-01-06 DIAGNOSIS — E782 Mixed hyperlipidemia: Secondary | ICD-10-CM

## 2023-01-06 DIAGNOSIS — L821 Other seborrheic keratosis: Secondary | ICD-10-CM | POA: Diagnosis not present

## 2023-01-07 DIAGNOSIS — H6123 Impacted cerumen, bilateral: Secondary | ICD-10-CM | POA: Diagnosis not present

## 2023-01-12 DIAGNOSIS — E039 Hypothyroidism, unspecified: Secondary | ICD-10-CM | POA: Diagnosis not present

## 2023-01-14 DIAGNOSIS — H6122 Impacted cerumen, left ear: Secondary | ICD-10-CM | POA: Diagnosis not present

## 2023-04-26 DIAGNOSIS — M47816 Spondylosis without myelopathy or radiculopathy, lumbar region: Secondary | ICD-10-CM | POA: Diagnosis not present

## 2023-04-26 DIAGNOSIS — I2510 Atherosclerotic heart disease of native coronary artery without angina pectoris: Secondary | ICD-10-CM | POA: Diagnosis not present

## 2023-04-26 DIAGNOSIS — R7301 Impaired fasting glucose: Secondary | ICD-10-CM | POA: Diagnosis not present

## 2023-04-26 DIAGNOSIS — E7800 Pure hypercholesterolemia, unspecified: Secondary | ICD-10-CM | POA: Diagnosis not present

## 2023-04-26 DIAGNOSIS — H6123 Impacted cerumen, bilateral: Secondary | ICD-10-CM | POA: Diagnosis not present

## 2023-04-26 DIAGNOSIS — E039 Hypothyroidism, unspecified: Secondary | ICD-10-CM | POA: Diagnosis not present

## 2023-09-22 ENCOUNTER — Other Ambulatory Visit: Payer: Self-pay

## 2023-09-22 ENCOUNTER — Emergency Department

## 2023-09-22 DIAGNOSIS — R9089 Other abnormal findings on diagnostic imaging of central nervous system: Secondary | ICD-10-CM | POA: Diagnosis not present

## 2023-09-22 DIAGNOSIS — S0003XA Contusion of scalp, initial encounter: Secondary | ICD-10-CM | POA: Diagnosis not present

## 2023-09-22 DIAGNOSIS — S199XXA Unspecified injury of neck, initial encounter: Secondary | ICD-10-CM | POA: Diagnosis not present

## 2023-09-22 DIAGNOSIS — Z5321 Procedure and treatment not carried out due to patient leaving prior to being seen by health care provider: Secondary | ICD-10-CM | POA: Diagnosis not present

## 2023-09-22 DIAGNOSIS — W01198A Fall on same level from slipping, tripping and stumbling with subsequent striking against other object, initial encounter: Secondary | ICD-10-CM | POA: Insufficient documentation

## 2023-09-22 DIAGNOSIS — R519 Headache, unspecified: Secondary | ICD-10-CM | POA: Insufficient documentation

## 2023-09-22 NOTE — ED Triage Notes (Addendum)
 Patient C/O mechanical fall. Patient states his wife was falling and bumped into him, causing him to fall backwards and hit his head on the concrete. Patient states that he does have a headache, but no LOC, no vision changes, no vomiting, no laceration noted. Patient denies blood thinner use. Patient denies any injury to his back, neck, etc.

## 2023-09-22 NOTE — ED Notes (Signed)
 Patient transported to CT

## 2023-09-23 ENCOUNTER — Emergency Department
Admission: EM | Admit: 2023-09-23 | Discharge: 2023-09-23 | Discharge status: Against Medical Advice | Attending: Emergency Medicine | Admitting: Emergency Medicine

## 2023-09-23 ENCOUNTER — Emergency Department

## 2023-09-23 DIAGNOSIS — R9089 Other abnormal findings on diagnostic imaging of central nervous system: Secondary | ICD-10-CM | POA: Diagnosis not present

## 2023-09-23 DIAGNOSIS — S199XXA Unspecified injury of neck, initial encounter: Secondary | ICD-10-CM | POA: Diagnosis not present

## 2023-09-23 DIAGNOSIS — S0003XA Contusion of scalp, initial encounter: Secondary | ICD-10-CM | POA: Diagnosis not present

## 2023-09-23 NOTE — ED Notes (Signed)
 Pt back in lobby with family.

## 2023-09-26 DIAGNOSIS — I2510 Atherosclerotic heart disease of native coronary artery without angina pectoris: Secondary | ICD-10-CM | POA: Diagnosis not present

## 2023-09-26 DIAGNOSIS — M170 Bilateral primary osteoarthritis of knee: Secondary | ICD-10-CM | POA: Diagnosis not present

## 2023-09-26 DIAGNOSIS — I350 Nonrheumatic aortic (valve) stenosis: Secondary | ICD-10-CM | POA: Diagnosis not present

## 2023-10-04 DIAGNOSIS — G629 Polyneuropathy, unspecified: Secondary | ICD-10-CM | POA: Diagnosis not present

## 2023-10-04 DIAGNOSIS — R609 Edema, unspecified: Secondary | ICD-10-CM | POA: Diagnosis not present

## 2023-10-04 DIAGNOSIS — R7303 Prediabetes: Secondary | ICD-10-CM | POA: Diagnosis not present

## 2023-10-06 DIAGNOSIS — M4802 Spinal stenosis, cervical region: Secondary | ICD-10-CM | POA: Diagnosis not present

## 2023-10-06 DIAGNOSIS — I2510 Atherosclerotic heart disease of native coronary artery without angina pectoris: Secondary | ICD-10-CM | POA: Diagnosis not present

## 2023-10-06 DIAGNOSIS — M5030 Other cervical disc degeneration, unspecified cervical region: Secondary | ICD-10-CM | POA: Diagnosis not present

## 2023-10-06 DIAGNOSIS — M170 Bilateral primary osteoarthritis of knee: Secondary | ICD-10-CM | POA: Diagnosis not present

## 2023-10-06 DIAGNOSIS — E039 Hypothyroidism, unspecified: Secondary | ICD-10-CM | POA: Diagnosis not present

## 2023-10-06 DIAGNOSIS — Z951 Presence of aortocoronary bypass graft: Secondary | ICD-10-CM | POA: Diagnosis not present

## 2023-10-06 DIAGNOSIS — M47812 Spondylosis without myelopathy or radiculopathy, cervical region: Secondary | ICD-10-CM | POA: Diagnosis not present

## 2023-10-06 DIAGNOSIS — J449 Chronic obstructive pulmonary disease, unspecified: Secondary | ICD-10-CM | POA: Diagnosis not present

## 2023-10-06 DIAGNOSIS — R7303 Prediabetes: Secondary | ICD-10-CM | POA: Diagnosis not present

## 2023-10-06 DIAGNOSIS — Z952 Presence of prosthetic heart valve: Secondary | ICD-10-CM | POA: Diagnosis not present

## 2023-10-06 DIAGNOSIS — Z7902 Long term (current) use of antithrombotics/antiplatelets: Secondary | ICD-10-CM | POA: Diagnosis not present

## 2023-10-06 DIAGNOSIS — F17210 Nicotine dependence, cigarettes, uncomplicated: Secondary | ICD-10-CM | POA: Diagnosis not present

## 2023-10-06 DIAGNOSIS — E7800 Pure hypercholesterolemia, unspecified: Secondary | ICD-10-CM | POA: Diagnosis not present

## 2023-10-06 DIAGNOSIS — Z7982 Long term (current) use of aspirin: Secondary | ICD-10-CM | POA: Diagnosis not present

## 2023-10-12 DIAGNOSIS — M4802 Spinal stenosis, cervical region: Secondary | ICD-10-CM | POA: Diagnosis not present

## 2023-10-12 DIAGNOSIS — Z7982 Long term (current) use of aspirin: Secondary | ICD-10-CM | POA: Diagnosis not present

## 2023-10-12 DIAGNOSIS — M47812 Spondylosis without myelopathy or radiculopathy, cervical region: Secondary | ICD-10-CM | POA: Diagnosis not present

## 2023-10-12 DIAGNOSIS — R7303 Prediabetes: Secondary | ICD-10-CM | POA: Diagnosis not present

## 2023-10-12 DIAGNOSIS — M170 Bilateral primary osteoarthritis of knee: Secondary | ICD-10-CM | POA: Diagnosis not present

## 2023-10-12 DIAGNOSIS — Z951 Presence of aortocoronary bypass graft: Secondary | ICD-10-CM | POA: Diagnosis not present

## 2023-10-12 DIAGNOSIS — I2510 Atherosclerotic heart disease of native coronary artery without angina pectoris: Secondary | ICD-10-CM | POA: Diagnosis not present

## 2023-10-12 DIAGNOSIS — E039 Hypothyroidism, unspecified: Secondary | ICD-10-CM | POA: Diagnosis not present

## 2023-10-12 DIAGNOSIS — Z952 Presence of prosthetic heart valve: Secondary | ICD-10-CM | POA: Diagnosis not present

## 2023-10-12 DIAGNOSIS — J449 Chronic obstructive pulmonary disease, unspecified: Secondary | ICD-10-CM | POA: Diagnosis not present

## 2023-10-12 DIAGNOSIS — F17210 Nicotine dependence, cigarettes, uncomplicated: Secondary | ICD-10-CM | POA: Diagnosis not present

## 2023-10-12 DIAGNOSIS — Z7902 Long term (current) use of antithrombotics/antiplatelets: Secondary | ICD-10-CM | POA: Diagnosis not present

## 2023-10-12 DIAGNOSIS — M5030 Other cervical disc degeneration, unspecified cervical region: Secondary | ICD-10-CM | POA: Diagnosis not present

## 2023-10-12 DIAGNOSIS — E7800 Pure hypercholesterolemia, unspecified: Secondary | ICD-10-CM | POA: Diagnosis not present

## 2023-10-17 DIAGNOSIS — Z23 Encounter for immunization: Secondary | ICD-10-CM | POA: Diagnosis not present

## 2023-10-17 DIAGNOSIS — E039 Hypothyroidism, unspecified: Secondary | ICD-10-CM | POA: Diagnosis not present

## 2023-10-17 DIAGNOSIS — F17210 Nicotine dependence, cigarettes, uncomplicated: Secondary | ICD-10-CM | POA: Diagnosis not present

## 2023-10-17 DIAGNOSIS — Z0000 Encounter for general adult medical examination without abnormal findings: Secondary | ICD-10-CM | POA: Diagnosis not present

## 2023-10-17 DIAGNOSIS — H6123 Impacted cerumen, bilateral: Secondary | ICD-10-CM | POA: Diagnosis not present

## 2023-10-17 DIAGNOSIS — E7800 Pure hypercholesterolemia, unspecified: Secondary | ICD-10-CM | POA: Diagnosis not present

## 2023-10-17 DIAGNOSIS — I2510 Atherosclerotic heart disease of native coronary artery without angina pectoris: Secondary | ICD-10-CM | POA: Diagnosis not present

## 2023-10-20 DIAGNOSIS — Z7902 Long term (current) use of antithrombotics/antiplatelets: Secondary | ICD-10-CM | POA: Diagnosis not present

## 2023-10-20 DIAGNOSIS — Z952 Presence of prosthetic heart valve: Secondary | ICD-10-CM | POA: Diagnosis not present

## 2023-10-20 DIAGNOSIS — E7800 Pure hypercholesterolemia, unspecified: Secondary | ICD-10-CM | POA: Diagnosis not present

## 2023-10-20 DIAGNOSIS — M4802 Spinal stenosis, cervical region: Secondary | ICD-10-CM | POA: Diagnosis not present

## 2023-10-20 DIAGNOSIS — M170 Bilateral primary osteoarthritis of knee: Secondary | ICD-10-CM | POA: Diagnosis not present

## 2023-10-20 DIAGNOSIS — Z951 Presence of aortocoronary bypass graft: Secondary | ICD-10-CM | POA: Diagnosis not present

## 2023-10-20 DIAGNOSIS — M47812 Spondylosis without myelopathy or radiculopathy, cervical region: Secondary | ICD-10-CM | POA: Diagnosis not present

## 2023-10-20 DIAGNOSIS — R7303 Prediabetes: Secondary | ICD-10-CM | POA: Diagnosis not present

## 2023-10-20 DIAGNOSIS — M5030 Other cervical disc degeneration, unspecified cervical region: Secondary | ICD-10-CM | POA: Diagnosis not present

## 2023-10-20 DIAGNOSIS — F17210 Nicotine dependence, cigarettes, uncomplicated: Secondary | ICD-10-CM | POA: Diagnosis not present

## 2023-10-20 DIAGNOSIS — I2510 Atherosclerotic heart disease of native coronary artery without angina pectoris: Secondary | ICD-10-CM | POA: Diagnosis not present

## 2023-10-20 DIAGNOSIS — J449 Chronic obstructive pulmonary disease, unspecified: Secondary | ICD-10-CM | POA: Diagnosis not present

## 2023-10-20 DIAGNOSIS — E039 Hypothyroidism, unspecified: Secondary | ICD-10-CM | POA: Diagnosis not present

## 2023-10-20 DIAGNOSIS — Z7982 Long term (current) use of aspirin: Secondary | ICD-10-CM | POA: Diagnosis not present

## 2023-10-25 DIAGNOSIS — M170 Bilateral primary osteoarthritis of knee: Secondary | ICD-10-CM | POA: Diagnosis not present

## 2023-10-25 DIAGNOSIS — R7303 Prediabetes: Secondary | ICD-10-CM | POA: Diagnosis not present

## 2023-10-25 DIAGNOSIS — E039 Hypothyroidism, unspecified: Secondary | ICD-10-CM | POA: Diagnosis not present

## 2023-10-25 DIAGNOSIS — Z951 Presence of aortocoronary bypass graft: Secondary | ICD-10-CM | POA: Diagnosis not present

## 2023-10-25 DIAGNOSIS — J449 Chronic obstructive pulmonary disease, unspecified: Secondary | ICD-10-CM | POA: Diagnosis not present

## 2023-10-25 DIAGNOSIS — Z7902 Long term (current) use of antithrombotics/antiplatelets: Secondary | ICD-10-CM | POA: Diagnosis not present

## 2023-10-25 DIAGNOSIS — M5030 Other cervical disc degeneration, unspecified cervical region: Secondary | ICD-10-CM | POA: Diagnosis not present

## 2023-10-25 DIAGNOSIS — E7800 Pure hypercholesterolemia, unspecified: Secondary | ICD-10-CM | POA: Diagnosis not present

## 2023-10-25 DIAGNOSIS — M4802 Spinal stenosis, cervical region: Secondary | ICD-10-CM | POA: Diagnosis not present

## 2023-10-25 DIAGNOSIS — M47812 Spondylosis without myelopathy or radiculopathy, cervical region: Secondary | ICD-10-CM | POA: Diagnosis not present

## 2023-10-25 DIAGNOSIS — Z7982 Long term (current) use of aspirin: Secondary | ICD-10-CM | POA: Diagnosis not present

## 2023-10-25 DIAGNOSIS — F17210 Nicotine dependence, cigarettes, uncomplicated: Secondary | ICD-10-CM | POA: Diagnosis not present

## 2023-10-25 DIAGNOSIS — Z952 Presence of prosthetic heart valve: Secondary | ICD-10-CM | POA: Diagnosis not present

## 2023-10-25 DIAGNOSIS — I2510 Atherosclerotic heart disease of native coronary artery without angina pectoris: Secondary | ICD-10-CM | POA: Diagnosis not present

## 2023-10-27 DIAGNOSIS — I2510 Atherosclerotic heart disease of native coronary artery without angina pectoris: Secondary | ICD-10-CM | POA: Diagnosis not present

## 2023-10-27 DIAGNOSIS — M170 Bilateral primary osteoarthritis of knee: Secondary | ICD-10-CM | POA: Diagnosis not present

## 2023-10-27 DIAGNOSIS — I350 Nonrheumatic aortic (valve) stenosis: Secondary | ICD-10-CM | POA: Diagnosis not present

## 2023-10-30 DIAGNOSIS — M47812 Spondylosis without myelopathy or radiculopathy, cervical region: Secondary | ICD-10-CM | POA: Diagnosis not present

## 2023-10-30 DIAGNOSIS — Z7982 Long term (current) use of aspirin: Secondary | ICD-10-CM | POA: Diagnosis not present

## 2023-10-30 DIAGNOSIS — I2510 Atherosclerotic heart disease of native coronary artery without angina pectoris: Secondary | ICD-10-CM | POA: Diagnosis not present

## 2023-10-30 DIAGNOSIS — Z952 Presence of prosthetic heart valve: Secondary | ICD-10-CM | POA: Diagnosis not present

## 2023-10-30 DIAGNOSIS — Z951 Presence of aortocoronary bypass graft: Secondary | ICD-10-CM | POA: Diagnosis not present

## 2023-10-30 DIAGNOSIS — J449 Chronic obstructive pulmonary disease, unspecified: Secondary | ICD-10-CM | POA: Diagnosis not present

## 2023-10-30 DIAGNOSIS — Z7902 Long term (current) use of antithrombotics/antiplatelets: Secondary | ICD-10-CM | POA: Diagnosis not present

## 2023-10-30 DIAGNOSIS — R7303 Prediabetes: Secondary | ICD-10-CM | POA: Diagnosis not present

## 2023-10-30 DIAGNOSIS — M5030 Other cervical disc degeneration, unspecified cervical region: Secondary | ICD-10-CM | POA: Diagnosis not present

## 2023-10-30 DIAGNOSIS — E039 Hypothyroidism, unspecified: Secondary | ICD-10-CM | POA: Diagnosis not present

## 2023-10-30 DIAGNOSIS — M4802 Spinal stenosis, cervical region: Secondary | ICD-10-CM | POA: Diagnosis not present

## 2023-10-30 DIAGNOSIS — F17210 Nicotine dependence, cigarettes, uncomplicated: Secondary | ICD-10-CM | POA: Diagnosis not present

## 2023-10-30 DIAGNOSIS — M170 Bilateral primary osteoarthritis of knee: Secondary | ICD-10-CM | POA: Diagnosis not present

## 2023-10-30 DIAGNOSIS — E7800 Pure hypercholesterolemia, unspecified: Secondary | ICD-10-CM | POA: Diagnosis not present

## 2023-11-08 DIAGNOSIS — Z7982 Long term (current) use of aspirin: Secondary | ICD-10-CM | POA: Diagnosis not present

## 2023-11-08 DIAGNOSIS — Z951 Presence of aortocoronary bypass graft: Secondary | ICD-10-CM | POA: Diagnosis not present

## 2023-11-08 DIAGNOSIS — J449 Chronic obstructive pulmonary disease, unspecified: Secondary | ICD-10-CM | POA: Diagnosis not present

## 2023-11-08 DIAGNOSIS — R7303 Prediabetes: Secondary | ICD-10-CM | POA: Diagnosis not present

## 2023-11-08 DIAGNOSIS — Z7902 Long term (current) use of antithrombotics/antiplatelets: Secondary | ICD-10-CM | POA: Diagnosis not present

## 2023-11-08 DIAGNOSIS — M47812 Spondylosis without myelopathy or radiculopathy, cervical region: Secondary | ICD-10-CM | POA: Diagnosis not present

## 2023-11-08 DIAGNOSIS — E039 Hypothyroidism, unspecified: Secondary | ICD-10-CM | POA: Diagnosis not present

## 2023-11-08 DIAGNOSIS — Z952 Presence of prosthetic heart valve: Secondary | ICD-10-CM | POA: Diagnosis not present

## 2023-11-08 DIAGNOSIS — M5030 Other cervical disc degeneration, unspecified cervical region: Secondary | ICD-10-CM | POA: Diagnosis not present

## 2023-11-08 DIAGNOSIS — M4802 Spinal stenosis, cervical region: Secondary | ICD-10-CM | POA: Diagnosis not present

## 2023-11-08 DIAGNOSIS — E7800 Pure hypercholesterolemia, unspecified: Secondary | ICD-10-CM | POA: Diagnosis not present

## 2023-11-08 DIAGNOSIS — M170 Bilateral primary osteoarthritis of knee: Secondary | ICD-10-CM | POA: Diagnosis not present

## 2023-11-08 DIAGNOSIS — F17210 Nicotine dependence, cigarettes, uncomplicated: Secondary | ICD-10-CM | POA: Diagnosis not present

## 2023-11-08 DIAGNOSIS — I2510 Atherosclerotic heart disease of native coronary artery without angina pectoris: Secondary | ICD-10-CM | POA: Diagnosis not present

## 2023-11-24 DIAGNOSIS — Z7902 Long term (current) use of antithrombotics/antiplatelets: Secondary | ICD-10-CM | POA: Diagnosis not present

## 2023-11-25 ENCOUNTER — Emergency Department (HOSPITAL_COMMUNITY)

## 2023-11-25 ENCOUNTER — Other Ambulatory Visit: Payer: Self-pay

## 2023-11-25 ENCOUNTER — Inpatient Hospital Stay (HOSPITAL_COMMUNITY)
Admission: EM | Admit: 2023-11-25 | Discharge: 2023-12-07 | DRG: 190 | Disposition: A | Discharge status: Skilled Nursing Facility | Source: Other Acute Inpatient Hospital | Attending: Internal Medicine | Admitting: Internal Medicine

## 2023-11-25 ENCOUNTER — Encounter (HOSPITAL_COMMUNITY): Payer: Self-pay

## 2023-11-25 DIAGNOSIS — Z951 Presence of aortocoronary bypass graft: Secondary | ICD-10-CM

## 2023-11-25 DIAGNOSIS — E875 Hyperkalemia: Secondary | ICD-10-CM | POA: Diagnosis not present

## 2023-11-25 DIAGNOSIS — H9190 Unspecified hearing loss, unspecified ear: Secondary | ICD-10-CM | POA: Diagnosis present

## 2023-11-25 DIAGNOSIS — I10 Essential (primary) hypertension: Secondary | ICD-10-CM | POA: Diagnosis present

## 2023-11-25 DIAGNOSIS — G4733 Obstructive sleep apnea (adult) (pediatric): Secondary | ICD-10-CM | POA: Diagnosis present

## 2023-11-25 DIAGNOSIS — R5381 Other malaise: Secondary | ICD-10-CM | POA: Diagnosis not present

## 2023-11-25 DIAGNOSIS — M109 Gout, unspecified: Secondary | ICD-10-CM | POA: Diagnosis present

## 2023-11-25 DIAGNOSIS — J9621 Acute and chronic respiratory failure with hypoxia: Secondary | ICD-10-CM | POA: Diagnosis not present

## 2023-11-25 DIAGNOSIS — R001 Bradycardia, unspecified: Secondary | ICD-10-CM | POA: Diagnosis not present

## 2023-11-25 DIAGNOSIS — Z7982 Long term (current) use of aspirin: Secondary | ICD-10-CM | POA: Diagnosis not present

## 2023-11-25 DIAGNOSIS — Z888 Allergy status to other drugs, medicaments and biological substances status: Secondary | ICD-10-CM

## 2023-11-25 DIAGNOSIS — J441 Chronic obstructive pulmonary disease with (acute) exacerbation: Secondary | ICD-10-CM | POA: Diagnosis not present

## 2023-11-25 DIAGNOSIS — D696 Thrombocytopenia, unspecified: Secondary | ICD-10-CM | POA: Diagnosis present

## 2023-11-25 DIAGNOSIS — T17990A Other foreign object in respiratory tract, part unspecified in causing asphyxiation, initial encounter: Secondary | ICD-10-CM | POA: Diagnosis not present

## 2023-11-25 DIAGNOSIS — F17210 Nicotine dependence, cigarettes, uncomplicated: Secondary | ICD-10-CM | POA: Diagnosis present

## 2023-11-25 DIAGNOSIS — Z955 Presence of coronary angioplasty implant and graft: Secondary | ICD-10-CM

## 2023-11-25 DIAGNOSIS — T380X5A Adverse effect of glucocorticoids and synthetic analogues, initial encounter: Secondary | ICD-10-CM | POA: Diagnosis not present

## 2023-11-25 DIAGNOSIS — I2510 Atherosclerotic heart disease of native coronary artery without angina pectoris: Secondary | ICD-10-CM | POA: Diagnosis present

## 2023-11-25 DIAGNOSIS — I252 Old myocardial infarction: Secondary | ICD-10-CM

## 2023-11-25 DIAGNOSIS — E7800 Pure hypercholesterolemia, unspecified: Secondary | ICD-10-CM | POA: Diagnosis present

## 2023-11-25 DIAGNOSIS — E039 Hypothyroidism, unspecified: Secondary | ICD-10-CM | POA: Diagnosis not present

## 2023-11-25 DIAGNOSIS — Z6838 Body mass index (BMI) 38.0-38.9, adult: Secondary | ICD-10-CM

## 2023-11-25 DIAGNOSIS — Z79899 Other long term (current) drug therapy: Secondary | ICD-10-CM

## 2023-11-25 DIAGNOSIS — J9622 Acute and chronic respiratory failure with hypercapnia: Secondary | ICD-10-CM | POA: Diagnosis present

## 2023-11-25 DIAGNOSIS — Z952 Presence of prosthetic heart valve: Secondary | ICD-10-CM

## 2023-11-25 DIAGNOSIS — R739 Hyperglycemia, unspecified: Secondary | ICD-10-CM | POA: Diagnosis not present

## 2023-11-25 DIAGNOSIS — R0602 Shortness of breath: Secondary | ICD-10-CM | POA: Diagnosis not present

## 2023-11-25 DIAGNOSIS — Y92239 Unspecified place in hospital as the place of occurrence of the external cause: Secondary | ICD-10-CM | POA: Diagnosis not present

## 2023-11-25 DIAGNOSIS — I350 Nonrheumatic aortic (valve) stenosis: Secondary | ICD-10-CM | POA: Diagnosis present

## 2023-11-25 DIAGNOSIS — Z1152 Encounter for screening for COVID-19: Secondary | ICD-10-CM

## 2023-11-25 DIAGNOSIS — Z860100 Personal history of colon polyps, unspecified: Secondary | ICD-10-CM

## 2023-11-25 DIAGNOSIS — Z7902 Long term (current) use of antithrombotics/antiplatelets: Secondary | ICD-10-CM

## 2023-11-25 DIAGNOSIS — Z79890 Hormone replacement therapy (postmenopausal): Secondary | ICD-10-CM

## 2023-11-25 DIAGNOSIS — Z823 Family history of stroke: Secondary | ICD-10-CM

## 2023-11-25 DIAGNOSIS — E66812 Obesity, class 2: Secondary | ICD-10-CM | POA: Diagnosis present

## 2023-11-25 LAB — CBC
HCT: 46.7 % (ref 39.0–52.0)
Hemoglobin: 14.7 g/dL (ref 13.0–17.0)
MCH: 31.9 pg (ref 26.0–34.0)
MCHC: 31.5 g/dL (ref 30.0–36.0)
MCV: 101.3 fL — ABNORMAL HIGH (ref 80.0–100.0)
Platelets: 129 K/uL — ABNORMAL LOW (ref 150–400)
RBC: 4.61 MIL/uL (ref 4.22–5.81)
RDW: 14.4 % (ref 11.5–15.5)
WBC: 5.6 K/uL (ref 4.0–10.5)
nRBC: 0 % (ref 0.0–0.2)

## 2023-11-25 LAB — BASIC METABOLIC PANEL WITH GFR
Anion gap: 12 (ref 5–15)
BUN: 16 mg/dL (ref 8–23)
CO2: 29 mmol/L (ref 22–32)
Calcium: 9.4 mg/dL (ref 8.9–10.3)
Chloride: 95 mmol/L — ABNORMAL LOW (ref 98–111)
Creatinine, Ser: 0.85 mg/dL (ref 0.61–1.24)
GFR, Estimated: >60 mL/min (ref >60)
Glucose, Bld: 100 mg/dL — ABNORMAL HIGH (ref 70–99)
Potassium: 4.5 mmol/L (ref 3.5–5.1)
Sodium: 135 mmol/L (ref 135–145)

## 2023-11-25 MED ORDER — IPRATROPIUM-ALBUTEROL 0.5-2.5 (3) MG/3ML IN SOLN
3.0000 mL | Freq: Once | RESPIRATORY_TRACT | Status: AC
  Administered 2023-11-25: 3 mL via RESPIRATORY_TRACT
  Filled 2023-11-25: qty 3

## 2023-11-25 MED ORDER — MAGNESIUM SULFATE 2 GM/50ML IV SOLN
2.0000 g | Freq: Once | INTRAVENOUS | Status: AC
  Administered 2023-11-25: 2 g via INTRAVENOUS
  Filled 2023-11-25: qty 50

## 2023-11-25 MED ORDER — DEXAMETHASONE SODIUM PHOSPHATE 10 MG/ML IJ SOLN
10.0000 mg | Freq: Once | INTRAMUSCULAR | Status: AC
  Administered 2023-11-25: 10 mg via INTRAVENOUS
  Filled 2023-11-25: qty 1

## 2023-11-25 NOTE — ED Triage Notes (Signed)
 HX of COPD smokes 60+ years, has been having wheezing and home nurse told them to go to urgent care, urgent care sent them to the ER. Has inhaler but doesn't know where it is.

## 2023-11-26 DIAGNOSIS — E039 Hypothyroidism, unspecified: Secondary | ICD-10-CM | POA: Diagnosis not present

## 2023-11-26 DIAGNOSIS — J441 Chronic obstructive pulmonary disease with (acute) exacerbation: Secondary | ICD-10-CM | POA: Diagnosis present

## 2023-11-26 DIAGNOSIS — R2681 Unsteadiness on feet: Secondary | ICD-10-CM | POA: Diagnosis not present

## 2023-11-26 DIAGNOSIS — I2510 Atherosclerotic heart disease of native coronary artery without angina pectoris: Secondary | ICD-10-CM | POA: Diagnosis not present

## 2023-11-26 DIAGNOSIS — J9811 Atelectasis: Secondary | ICD-10-CM | POA: Diagnosis not present

## 2023-11-26 DIAGNOSIS — F17210 Nicotine dependence, cigarettes, uncomplicated: Secondary | ICD-10-CM | POA: Diagnosis present

## 2023-11-26 DIAGNOSIS — R599 Enlarged lymph nodes, unspecified: Secondary | ICD-10-CM | POA: Diagnosis not present

## 2023-11-26 DIAGNOSIS — J449 Chronic obstructive pulmonary disease, unspecified: Secondary | ICD-10-CM | POA: Diagnosis not present

## 2023-11-26 DIAGNOSIS — M6281 Muscle weakness (generalized): Secondary | ICD-10-CM | POA: Diagnosis not present

## 2023-11-26 DIAGNOSIS — E7800 Pure hypercholesterolemia, unspecified: Secondary | ICD-10-CM | POA: Diagnosis not present

## 2023-11-26 DIAGNOSIS — Z7982 Long term (current) use of aspirin: Secondary | ICD-10-CM | POA: Diagnosis not present

## 2023-11-26 DIAGNOSIS — I272 Pulmonary hypertension, unspecified: Secondary | ICD-10-CM | POA: Diagnosis not present

## 2023-11-26 DIAGNOSIS — D696 Thrombocytopenia, unspecified: Secondary | ICD-10-CM | POA: Diagnosis not present

## 2023-11-26 DIAGNOSIS — I10 Essential (primary) hypertension: Secondary | ICD-10-CM | POA: Diagnosis not present

## 2023-11-26 DIAGNOSIS — E875 Hyperkalemia: Secondary | ICD-10-CM | POA: Diagnosis not present

## 2023-11-26 DIAGNOSIS — M1000 Idiopathic gout, unspecified site: Secondary | ICD-10-CM | POA: Diagnosis not present

## 2023-11-26 DIAGNOSIS — J9621 Acute and chronic respiratory failure with hypoxia: Secondary | ICD-10-CM | POA: Diagnosis present

## 2023-11-26 DIAGNOSIS — M170 Bilateral primary osteoarthritis of knee: Secondary | ICD-10-CM | POA: Diagnosis not present

## 2023-11-26 DIAGNOSIS — I350 Nonrheumatic aortic (valve) stenosis: Secondary | ICD-10-CM | POA: Diagnosis not present

## 2023-11-26 DIAGNOSIS — Z955 Presence of coronary angioplasty implant and graft: Secondary | ICD-10-CM | POA: Diagnosis not present

## 2023-11-26 DIAGNOSIS — R001 Bradycardia, unspecified: Secondary | ICD-10-CM | POA: Diagnosis not present

## 2023-11-26 DIAGNOSIS — R638 Other symptoms and signs concerning food and fluid intake: Secondary | ICD-10-CM | POA: Diagnosis not present

## 2023-11-26 DIAGNOSIS — T17990A Other foreign object in respiratory tract, part unspecified in causing asphyxiation, initial encounter: Secondary | ICD-10-CM | POA: Diagnosis present

## 2023-11-26 DIAGNOSIS — Z1152 Encounter for screening for COVID-19: Secondary | ICD-10-CM | POA: Diagnosis not present

## 2023-11-26 DIAGNOSIS — Z951 Presence of aortocoronary bypass graft: Secondary | ICD-10-CM | POA: Diagnosis not present

## 2023-11-26 DIAGNOSIS — Z7401 Bed confinement status: Secondary | ICD-10-CM | POA: Diagnosis not present

## 2023-11-26 DIAGNOSIS — Z6838 Body mass index (BMI) 38.0-38.9, adult: Secondary | ICD-10-CM | POA: Diagnosis not present

## 2023-11-26 DIAGNOSIS — R531 Weakness: Secondary | ICD-10-CM | POA: Diagnosis not present

## 2023-11-26 DIAGNOSIS — E119 Type 2 diabetes mellitus without complications: Secondary | ICD-10-CM | POA: Diagnosis not present

## 2023-11-26 DIAGNOSIS — G4733 Obstructive sleep apnea (adult) (pediatric): Secondary | ICD-10-CM | POA: Diagnosis present

## 2023-11-26 DIAGNOSIS — E66812 Obesity, class 2: Secondary | ICD-10-CM | POA: Diagnosis present

## 2023-11-26 DIAGNOSIS — R278 Other lack of coordination: Secondary | ICD-10-CM | POA: Diagnosis not present

## 2023-11-26 DIAGNOSIS — J9602 Acute respiratory failure with hypercapnia: Secondary | ICD-10-CM | POA: Diagnosis not present

## 2023-11-26 DIAGNOSIS — J9622 Acute and chronic respiratory failure with hypercapnia: Secondary | ICD-10-CM | POA: Diagnosis not present

## 2023-11-26 DIAGNOSIS — Z952 Presence of prosthetic heart valve: Secondary | ICD-10-CM | POA: Diagnosis not present

## 2023-11-26 DIAGNOSIS — N62 Hypertrophy of breast: Secondary | ICD-10-CM | POA: Diagnosis not present

## 2023-11-26 DIAGNOSIS — T380X5A Adverse effect of glucocorticoids and synthetic analogues, initial encounter: Secondary | ICD-10-CM | POA: Diagnosis not present

## 2023-11-26 DIAGNOSIS — R0609 Other forms of dyspnea: Secondary | ICD-10-CM | POA: Diagnosis not present

## 2023-11-26 DIAGNOSIS — E785 Hyperlipidemia, unspecified: Secondary | ICD-10-CM | POA: Diagnosis not present

## 2023-11-26 DIAGNOSIS — R7301 Impaired fasting glucose: Secondary | ICD-10-CM | POA: Diagnosis not present

## 2023-11-26 DIAGNOSIS — R739 Hyperglycemia, unspecified: Secondary | ICD-10-CM | POA: Diagnosis not present

## 2023-11-26 DIAGNOSIS — Z79890 Hormone replacement therapy (postmenopausal): Secondary | ICD-10-CM | POA: Diagnosis not present

## 2023-11-26 DIAGNOSIS — R5381 Other malaise: Secondary | ICD-10-CM | POA: Diagnosis present

## 2023-11-26 DIAGNOSIS — L219 Seborrheic dermatitis, unspecified: Secondary | ICD-10-CM | POA: Diagnosis not present

## 2023-11-26 DIAGNOSIS — J9601 Acute respiratory failure with hypoxia: Secondary | ICD-10-CM | POA: Diagnosis not present

## 2023-11-26 DIAGNOSIS — Y92239 Unspecified place in hospital as the place of occurrence of the external cause: Secondary | ICD-10-CM | POA: Diagnosis not present

## 2023-11-26 DIAGNOSIS — Z743 Need for continuous supervision: Secondary | ICD-10-CM | POA: Diagnosis not present

## 2023-11-26 DIAGNOSIS — I459 Conduction disorder, unspecified: Secondary | ICD-10-CM | POA: Diagnosis not present

## 2023-11-26 DIAGNOSIS — Z720 Tobacco use: Secondary | ICD-10-CM | POA: Diagnosis not present

## 2023-11-26 LAB — RESP PANEL BY RT-PCR (RSV, FLU A&B, COVID)  RVPGX2
Influenza A by PCR: NEGATIVE
Influenza B by PCR: NEGATIVE
Resp Syncytial Virus by PCR: NEGATIVE
SARS Coronavirus 2 by RT PCR: NEGATIVE

## 2023-11-26 LAB — MRSA NEXT GEN BY PCR, NASAL: MRSA by PCR Next Gen: NOT DETECTED

## 2023-11-26 LAB — COMPREHENSIVE METABOLIC PANEL WITH GFR
ALT: 22 U/L (ref 0–44)
AST: 28 U/L (ref 15–41)
Albumin: 4.3 g/dL (ref 3.5–5.0)
Alkaline Phosphatase: 45 U/L (ref 38–126)
Anion gap: 10 (ref 5–15)
BUN: 19 mg/dL (ref 8–23)
CO2: 31 mmol/L (ref 22–32)
Calcium: 9.5 mg/dL (ref 8.9–10.3)
Chloride: 95 mmol/L — ABNORMAL LOW (ref 98–111)
Creatinine, Ser: 0.87 mg/dL (ref 0.61–1.24)
GFR, Estimated: >60 mL/min (ref >60)
Glucose, Bld: 151 mg/dL — ABNORMAL HIGH (ref 70–99)
Potassium: 5 mmol/L (ref 3.5–5.1)
Sodium: 136 mmol/L (ref 135–145)
Total Bilirubin: 0.4 mg/dL (ref 0.0–1.2)
Total Protein: 7.4 g/dL (ref 6.5–8.1)

## 2023-11-26 LAB — CBC
HCT: 48.1 % (ref 39.0–52.0)
Hemoglobin: 15.1 g/dL (ref 13.0–17.0)
MCH: 32.5 pg (ref 26.0–34.0)
MCHC: 31.4 g/dL (ref 30.0–36.0)
MCV: 103.4 fL — ABNORMAL HIGH (ref 80.0–100.0)
Platelets: 133 K/uL — ABNORMAL LOW (ref 150–400)
RBC: 4.65 MIL/uL (ref 4.22–5.81)
RDW: 14.4 % (ref 11.5–15.5)
WBC: 8.5 K/uL (ref 4.0–10.5)
nRBC: 0 % (ref 0.0–0.2)

## 2023-11-26 LAB — BLOOD GAS, ARTERIAL
Acid-Base Excess: 7.8 mmol/L — ABNORMAL HIGH (ref 0.0–2.0)
Bicarbonate: 36.1 mmol/L — ABNORMAL HIGH (ref 20.0–28.0)
Drawn by: 25770
O2 Content: 5 L/min
O2 Saturation: 97.2 %
Patient temperature: 37.1
pCO2 arterial: 67 mmHg (ref 32–48)
pH, Arterial: 7.34 — ABNORMAL LOW (ref 7.35–7.45)
pO2, Arterial: 81 mmHg — ABNORMAL LOW (ref 83–108)

## 2023-11-26 LAB — MAGNESIUM: Magnesium: 2.7 mg/dL — ABNORMAL HIGH (ref 1.7–2.4)

## 2023-11-26 LAB — HIV ANTIBODY (ROUTINE TESTING W REFLEX): HIV Screen 4th Generation wRfx: NONREACTIVE

## 2023-11-26 MED ORDER — NICOTINE 14 MG/24HR TD PT24
14.0000 mg | MEDICATED_PATCH | Freq: Every day | TRANSDERMAL | Status: DC
  Administered 2023-11-26 – 2023-12-07 (×12): 14 mg via TRANSDERMAL
  Filled 2023-11-26 (×11): qty 1

## 2023-11-26 MED ORDER — NYSTATIN 100000 UNIT/GM EX POWD
Freq: Three times a day (TID) | CUTANEOUS | Status: DC
  Administered 2023-11-29: 1 via TOPICAL
  Filled 2023-11-26 (×2): qty 15

## 2023-11-26 MED ORDER — LEVOTHYROXINE SODIUM 75 MCG PO TABS
150.0000 ug | ORAL_TABLET | Freq: Every day | ORAL | Status: DC
  Administered 2023-11-27 – 2023-12-07 (×11): 150 ug via ORAL
  Filled 2023-11-26 (×11): qty 2

## 2023-11-26 MED ORDER — ALBUTEROL SULFATE (2.5 MG/3ML) 0.083% IN NEBU
5.0000 mg | INHALATION_SOLUTION | Freq: Once | RESPIRATORY_TRACT | Status: AC
  Administered 2023-11-26: 5 mg via RESPIRATORY_TRACT
  Filled 2023-11-26: qty 6

## 2023-11-26 MED ORDER — SERTRALINE HCL 100 MG PO TABS
50.0000 mg | ORAL_TABLET | Freq: Every day | ORAL | Status: DC
  Administered 2023-11-26 – 2023-12-07 (×12): 50 mg via ORAL
  Filled 2023-11-26 (×12): qty 1

## 2023-11-26 MED ORDER — ORAL CARE MOUTH RINSE
15.0000 mL | OROMUCOSAL | Status: DC | PRN
  Administered 2023-11-27: 15 mL via OROMUCOSAL

## 2023-11-26 MED ORDER — NICOTINE POLACRILEX 2 MG MT GUM: 2.0000 mg | CHEWING_GUM | OROMUCOSAL | Status: DC | PRN

## 2023-11-26 MED ORDER — IPRATROPIUM-ALBUTEROL 0.5-2.5 (3) MG/3ML IN SOLN
3.0000 mL | Freq: Four times a day (QID) | RESPIRATORY_TRACT | Status: DC
  Administered 2023-11-26 (×3): 3 mL via RESPIRATORY_TRACT
  Filled 2023-11-26 (×3): qty 3

## 2023-11-26 MED ORDER — KETOCONAZOLE 2 % EX CREA
TOPICAL_CREAM | Freq: Two times a day (BID) | CUTANEOUS | Status: DC
  Administered 2023-11-29 – 2023-12-05 (×4): 1 via TOPICAL
  Filled 2023-11-26 (×2): qty 15

## 2023-11-26 MED ORDER — ASPIRIN 81 MG PO TBEC
81.0000 mg | DELAYED_RELEASE_TABLET | Freq: Every day | ORAL | Status: DC
  Administered 2023-11-26 – 2023-12-07 (×12): 81 mg via ORAL
  Filled 2023-11-26 (×12): qty 1

## 2023-11-26 MED ORDER — METOPROLOL TARTRATE 12.5 MG HALF TABLET
12.5000 mg | ORAL_TABLET | Freq: Two times a day (BID) | ORAL | Status: DC
  Administered 2023-11-26 – 2023-11-27 (×4): 12.5 mg via ORAL
  Filled 2023-11-26 (×4): qty 1

## 2023-11-26 MED ORDER — CLOPIDOGREL BISULFATE 75 MG PO TABS
75.0000 mg | ORAL_TABLET | Freq: Every day | ORAL | Status: DC
  Administered 2023-11-26 – 2023-12-07 (×12): 75 mg via ORAL
  Filled 2023-11-26 (×12): qty 1

## 2023-11-26 MED ORDER — MELATONIN 5 MG PO TABS
5.0000 mg | ORAL_TABLET | Freq: Every evening | ORAL | Status: DC | PRN
  Administered 2023-11-30 – 2023-12-06 (×5): 5 mg via ORAL
  Filled 2023-11-26 (×5): qty 1

## 2023-11-26 MED ORDER — ALBUTEROL SULFATE (2.5 MG/3ML) 0.083% IN NEBU
2.5000 mg | INHALATION_SOLUTION | RESPIRATORY_TRACT | Status: DC | PRN
Start: 2023-11-26 — End: 2023-12-07

## 2023-11-26 MED ORDER — ROSUVASTATIN CALCIUM 20 MG PO TABS
40.0000 mg | ORAL_TABLET | Freq: Every day | ORAL | Status: DC
Start: 2023-11-26 — End: 2023-12-07
  Administered 2023-11-26 – 2023-12-07 (×12): 40 mg via ORAL
  Filled 2023-11-26 (×12): qty 2

## 2023-11-26 MED ORDER — IPRATROPIUM-ALBUTEROL 0.5-2.5 (3) MG/3ML IN SOLN
3.0000 mL | Freq: Once | RESPIRATORY_TRACT | Status: AC
  Administered 2023-11-26: 3 mL via RESPIRATORY_TRACT
  Filled 2023-11-26: qty 3

## 2023-11-26 MED ORDER — METHYLPREDNISOLONE SODIUM SUCC 40 MG IJ SOLR
40.0000 mg | Freq: Two times a day (BID) | INTRAMUSCULAR | Status: AC
  Administered 2023-11-26 – 2023-11-29 (×7): 40 mg via INTRAVENOUS
  Filled 2023-11-26 (×7): qty 1

## 2023-11-26 MED ORDER — BUDESONIDE 0.25 MG/2ML IN SUSP
0.2500 mg | Freq: Two times a day (BID) | RESPIRATORY_TRACT | Status: DC
  Administered 2023-11-26 – 2023-12-07 (×23): 0.25 mg via RESPIRATORY_TRACT
  Filled 2023-11-26 (×23): qty 2

## 2023-11-26 MED ORDER — GUAIFENESIN ER 600 MG PO TB12
1200.0000 mg | ORAL_TABLET | Freq: Two times a day (BID) | ORAL | Status: DC
  Administered 2023-11-26 – 2023-12-07 (×23): 1200 mg via ORAL
  Filled 2023-11-26 (×23): qty 2

## 2023-11-26 MED ORDER — METHYLPREDNISOLONE SODIUM SUCC 40 MG IJ SOLR
40.0000 mg | Freq: Two times a day (BID) | INTRAMUSCULAR | Status: DC
  Administered 2023-11-26: 40 mg via INTRAVENOUS
  Filled 2023-11-26: qty 1

## 2023-11-26 MED ORDER — PNEUMOCOCCAL 20-VAL CONJ VACC 0.5 ML IM SUSY: 0.5000 mL | PREFILLED_SYRINGE | INTRAMUSCULAR | Status: DC | PRN

## 2023-11-26 MED ORDER — SALINE SPRAY 0.65 % NA SOLN
1.0000 | NASAL | Status: DC | PRN
  Administered 2023-11-26 – 2023-11-30 (×3): 1 via NASAL
  Filled 2023-11-26: qty 44

## 2023-11-26 MED ORDER — PREDNISONE 20 MG PO TABS: 40.0000 mg | ORAL_TABLET | Freq: Every day | ORAL | Status: DC

## 2023-11-26 MED ORDER — CHLORHEXIDINE GLUCONATE CLOTH 2 % EX PADS
6.0000 | MEDICATED_PAD | Freq: Every day | CUTANEOUS | Status: DC
  Administered 2023-11-26 – 2023-11-28 (×4): 6 via TOPICAL

## 2023-11-26 MED ORDER — ENOXAPARIN SODIUM 60 MG/0.6ML IJ SOSY
60.0000 mg | PREFILLED_SYRINGE | INTRAMUSCULAR | Status: DC
  Administered 2023-11-26 – 2023-12-01 (×6): 60 mg via SUBCUTANEOUS
  Filled 2023-11-26 (×6): qty 0.6

## 2023-11-26 NOTE — Progress Notes (Signed)
Notified Lab that ABG being sent for analysis. 

## 2023-11-26 NOTE — H&P (Cosign Needed Addendum)
 History and Physical    Jordan Hebert [id number] DOB: [birth year] DOA: 11/25/2023  DOS: the patient was seen and examined on 11/25/2023  PCP: Stacia Millman, PA   Patient coming from: Home  I have personally briefly reviewed patient's old medical records in  [facility] and Care[facility]where  HPI:   83 year old man PMH COPD, CAD s/p CABG, aortic valve stenosis, gout, hyperlipidemia, hypothyroidism, hypertension, chronic thrombocytopenia, hearing impairment, and tobacco use disorder presented to [facility] with reports of increased work of breath, wheezing which has been progressively getting worse.  He does endorse a cough with small amount of sputum production, no color.  Denies chest pain or palpitations.  He reports that he still smokes half a pack a day.  No known sick contacts.  At presentation to ED patient O2 sat dropped below 87% on room air requiring 2 L of oxygen  for maintaining SPO2 91 to 93%.  Otherwise hemodynamically stable and afebrile. CBC unremarkable with elevated MCV and chronically low platelet count 129. BMP unremarkable. Chest x-ray no acute cardiopulmonary process.  In the ED patient has been treated with DuoNeb nebulizer, Decadron  10 mg and mag sulfate 2 g.  Hospitalist has been consulted for further evaluation management of COPD exacerbation.     Review of Systems: As mentioned in the history of present illness. All other systems reviewed and are negative.  Review of Systems  Constitutional:  Positive for malaise/fatigue. Negative for chills and fever.  HENT:  Negative for congestion.   Respiratory:  Positive for cough, sputum production and wheezing. Negative for shortness of breath.   Cardiovascular:  Positive for leg swelling. Negative for chest pain and palpitations.  Gastrointestinal:  Negative for abdominal pain, constipation, diarrhea, nausea and vomiting.  Genitourinary:  Negative for dysuria.  Neurological:  Negative for focal weakness and  weakness.    Past Medical History:  Diagnosis Date   Back pain    Colon polyps    Coronary artery disease    Degenerative lumbar spinal stenosis    Depression    Erectile dysfunction    Gynecomastia    High cholesterol    Hyperthyroidism    Rheumatic fever    Seborrhea    Umbilical hernia     Past Surgical History:  Procedure Laterality Date   ABDOMINAL AORTOGRAM N/A 08/16/2017   Procedure: ABDOMINAL AORTOGRAM;  Surgeon: Burnard Debby LABOR, MD;  Location: [facility] INVASIVE CV LAB;  Service: Cardiovascular;  Laterality: N/A;   AORTIC VALVE REPLACEMENT N/A 08/17/2017   Procedure: AORTIC VALVE REPLACEMENT (AVR) WITH INSPIRIS AORTIC VALVE;  Surgeon: Fleeta Hanford Coy, MD;  Location: [facility];  Service: Open Heart Surgery;  Laterality: N/A;   CARDIAC CATHETERIZATION     CORONARY ANGIOGRAPHY N/A 08/16/2017   Procedure: CORONARY ANGIOGRAPHY;  Surgeon: Burnard Debby LABOR, MD;  Location: [facility] INVASIVE CV LAB;  Service: Cardiovascular;  Laterality: N/A;   CORONARY ARTERY BYPASS GRAFT N/A 08/17/2017   Procedure: CORONARY ARTERY BYPASS GRAFTING (CABG) x 4 , USING LEFT INTERNAL MAMMARY ARTERY AND GREATER SAPHENOUS VEIN HARVESTED ENDOSCOPICALLY;  Surgeon: Fleeta Hanford Coy, MD;  Location: [facility] OR;  Service: Open Heart Surgery;  Laterality: N/A;   CORONARY STENT PLACEMENT     TEE WITHOUT CARDIOVERSION N/A 08/17/2017   Procedure: TRANSESOPHAGEAL ECHOCARDIOGRAM (TEE);  Surgeon: Fleeta Hanford, Coy, MD;  Location: [facility] OR;  Service: Open Heart Surgery;  Laterality: N/A;     reports that he quit smoking about 6 years ago. His smoking use included cigarettes. He has never used  smokeless tobacco. He reports current alcohol use. He reports that he does not use drugs.  Allergies  Allergen Reactions   Viagra [Sildenafil]     Family History  Problem Relation Age of Onset   Stroke Mother    Cancer Brother    Aneurysm Sister        Cerebral    Other Son        Guillain-Barre Syndrome    Prior to Admission medications    Medication Sig Start Date End Date Taking? Authorizing Provider  acetaminophen  (TYLENOL ) 325 MG tablet Take 2 tablets (650 mg total) by mouth every 6 (six) hours as needed for mild pain. 08/24/17   Lucien Orren SAILOR, PA-C  aspirin  EC 81 MG tablet Take 1 tablet (81 mg total) by mouth daily. 03/01/18   Meng, Hao, PA  baclofen (LIORESAL) 10 MG tablet 1 tablet as needed 04/22/21   [provider]  clopidogrel  (PLAVIX ) 75 MG tablet Take 1 tablet by mouth daily.    [provider]  colchicine (COLCRYS) 0.6 MG tablet 1 tablet    [provider]  cyclobenzaprine (FLEXERIL) 10 MG tablet Take 1 tablet by mouth 3 (three) times daily.    [provider]  diclofenac Sodium (VOLTAREN) 1 % GEL 1 application 12/09/20   [provider]  Magnesium  250 MG TABS Take 1 tablet by mouth daily.    [provider]  metoprolol  tartrate (LOPRESSOR ) 25 MG tablet Take 0.5 tablets (12.5 mg total) by mouth 2 (two) times daily. 08/24/17   Lucien Orren SAILOR, PA-C  NEOMYCIN-POLYMYXIN-HYDROCORTISONE (CORTISPORIN) 1 % SOLN OTIC solution 4 drops into affected ear    [provider]  oxyCODONE -acetaminophen  (PERCOCET/ROXICET) 5-325 MG tablet Take 1-2 tablets by mouth every 8 (eight) hours as needed for severe pain. 06/28/22   Patsey Lot, MD  rosuvastatin  (CRESTOR ) 40 MG tablet Take 1 tablet (40 mg total) by mouth daily. 10/21/21   Court Dorn PARAS, MD  sertraline  (ZOLOFT ) 50 MG tablet Take 50 mg by mouth daily.    [provider]  vitamin B-12 (CYANOCOBALAMIN ) 1000 MCG tablet Take 1,000 mcg by mouth daily.    [provider]    Physical Exam: Vitals:   11/26/23 0949 11/26/23 1030 11/26/23 1350 11/26/23 1354  BP:  (!) 153/95    Pulse:  94  83  Resp:  18  16  Temp: 97.8 F (36.6 C)     TempSrc: Oral     SpO2:  94% 95% 95%  Weight:      Height:        Physical Exam Vitals reviewed.  Constitutional:      Appearance: He is well-developed. He is  obese.  HENT:     Head: Normocephalic.  Eyes:     Pupils: Pupils are equal, round, and reactive to light.  Cardiovascular:     Rate and Rhythm: Normal rate and regular rhythm.  Pulmonary:     Effort: Tachypnea present. No respiratory distress.     Breath sounds: Wheezing present. No rhonchi or rales.  Chest:     Chest wall: No tenderness.  Abdominal:     General: Bowel sounds are normal.     Comments: Obese abdomen  Musculoskeletal:     Right lower leg: No edema.  Skin:    General: Skin is warm and dry.     Capillary Refill: Capillary refill takes less than 2 seconds.  Neurological:     General: No focal deficit present.  Mental Status: He is lethargic.     Reassessment at 1005A: Patient is now awake and alert, no longer falling asleep during interview and wheezing significantly improved   Labs on Admission: I have personally reviewed following labs and imaging studies  CBC: Recent Labs  Lab 11/25/23 1947  WBC 5.6  HGB 14.7  HCT 46.7  MCV 101.3*  PLT 129*   Basic Metabolic Panel: Recent Labs  Lab 11/25/23 1947 11/26/23 0853  NA 135 136  K 4.5 5.0  CL 95* 95*  CO2 29 31  GLUCOSE 100* 151*  BUN 16 19  CREATININE 0.85 0.87  CALCIUM  9.4 9.5  MG  --  2.7*   GFR: Estimated Creatinine Clearance: 82.3 mL/min (by C-G formula based on SCr of 0.87 mg/dL). Liver Function Tests: Recent Labs  Lab 11/26/23 0853  AST 28  ALT 22  ALKPHOS 45  BILITOT 0.4  PROT 7.4  ALBUMIN  4.3   No results for input(s): LIPASE, AMYLASE in the last 168 hours. No results for input(s): AMMONIA in the last 168 hours. Coagulation Profile: No results for input(s): INR, PROTIME in the last 168 hours. Cardiac Enzymes: No results for input(s): CKTOTAL, CKMB, CKMBINDEX, TROPONINI, TROPONINIHS in the last 168 hours. BNP (last 3 results) No results for input(s): BNP in the last 8760 hours. HbA1C: No results for input(s): HGBA1C in the last 72 hours. CBG: No  results for input(s): GLUCAP in the last 168 hours. Lipid Profile: No results for input(s): CHOL, HDL, LDLCALC, TRIG, CHOLHDL, LDLDIRECT in the last 72 hours. Thyroid  Function Tests: No results for input(s): TSH, T4TOTAL, FREET4, T3FREE, THYROIDAB in the last 72 hours. Anemia Panel: No results for input(s): VITAMINB12, FOLATE, FERRITIN, TIBC, IRON, RETICCTPCT in the last 72 hours. Urine analysis:    Component Value Date/Time   COLORURINE YELLOW 08/16/2017 2120   APPEARANCEUR CLEAR 08/16/2017 2120   LABSPEC 1.016 08/16/2017 2120   PHURINE 6.0 08/16/2017 2120   GLUCOSEU NEGATIVE 08/16/2017 2120   HGBUR NEGATIVE 08/16/2017 2120   BILIRUBINUR NEGATIVE 08/16/2017 2120   KETONESUR NEGATIVE 08/16/2017 2120   PROTEINUR NEGATIVE 08/16/2017 2120   NITRITE NEGATIVE 08/16/2017 2120   LEUKOCYTESUR NEGATIVE 08/16/2017 2120    Radiological Exams on Admission: I have personally reviewed images DG Chest 2 View Result Date: 11/25/2023 CLINICAL DATA:  Shortness of breath EXAM: CHEST - 2 VIEW COMPARISON:  Chest x-ray 06/28/2022 FINDINGS: Patient is status post heart valve replacement, unchanged. The heart size and mediastinal contours are within normal limits. Both lungs are clear. The visualized skeletal structures are unremarkable. IMPRESSION: No active cardiopulmonary disease. Electronically Signed   By: Greig Pique M.D.   On: 11/25/2023 19:59    EKG: My personal interpretation of EKG shows: Normal sinus rhythm heart rate 71 with nonspecific T wave abnormalities and minimal ST elevation in anterior leads   Assessment/Plan Principal Problem:   COPD with acute exacerbation (HCC)   Active medical issues: ##COPD Exacerbation ##Acute Hypoxic Respiratory Failure - IV Solumedrol - Duonebs - Check COVID, Flu, RSV panel - Mucinex  - Supplemental O2, wean as able  - Pulmonary toilet: Mobilize/incentive spirometry/flutter valve   Chronic medical issues: #CAD s/p  CABG  - Continue home aspirin  and Plavix   #Hyperlipidemia  - Continue home Crestor   #Chronic thrombocytopenia   - Daily CBC - Discontinue chemical VTE prophylaxis for platelets <100  #Tobacco use disorder Patient reports smoking 0.5 pack per day  - Patient counseled on importance of continued cessation - NRT while inpatient with nicotine  patch  and as needed nicotine  gum   #Class II Obesity Estimated body mass index is 37.8 kg/m as calculated from the following:   Height as of this encounter: 5' 9 (1.753 m).   Weight as of this encounter: 116.1 kg.    VTE prophylaxis:  Lovenox   GI prophylaxis: Not indicated  Diet: Heart healthy Access: PIV Lines: None Code Status:  Full Code Telemetry: Yes  Admission status: Inpatient, Telemetry bed Patient is from: Home Anticipated d/c is to: Home Anticipated d/c date is: 1-2 days Patient currently: Receiving IV steroids, nebulizers, and pending weaning of oxygen , and symptomatic improvement  Family Communication: Wife called at [phone] and updated, wife is inpatient at [facility].  Daughter was at bedside visiting wife and subsequently came downstairs to ED.  Daughter [name] updated at bedside  Consults called: N/A    Severity of Illness: The appropriate patient status for this patient is INPATIENT. Inpatient status is judged to be reasonable and necessary in order to provide the required intensity of service to ensure the patient's safety. The patient's presenting symptoms, physical exam findings, and initial radiographic and laboratory data in the context of their chronic comorbidities is felt to place them at high risk for further clinical deterioration. Furthermore, it is not anticipated that the patient will be medically stable for discharge from the hospital within 2 midnights of admission.   * I certify that at the point of admission it is my clinical judgment that the patient will require inpatient hospital care spanning  beyond 2 midnights from the point of admission due to high intensity of service, high risk for further deterioration and high frequency of surveillance required.*  To reach the provider On-Call:   7AM- 7PM see care teams to locate the attending and reach out to them via [email or web address]. Password: [email or web address] 7PM-7AM contact night-coverage If you still have difficulty reaching the appropriate provider, please page the [facility] (Director on Call) for [facility] on amion for assistance  This document was prepared using Conservation officer, historic buildings and may include unintentional dictation errors.  Rockie Rams FNP-BC, PMHNP-BC Nurse Practitioner [facility]

## 2023-11-26 NOTE — Progress Notes (Signed)
 Assisted with transporting PT to [facility] from [facility] ED while off BiPAP (on 5 lpm [place]). PT is  not in respiratory distress, at this time, and is more alert and remains orientated. RT ICU RT is aware PT has arrived.

## 2023-11-26 NOTE — Progress Notes (Signed)
 Original ABG order was collected but apparently order had been discontinued therefore Lab could not result. Now another ABG has been ordered. RN aware.

## 2023-11-26 NOTE — Plan of Care (Signed)

## 2023-11-26 NOTE — ED Notes (Signed)
 Pt ambulated to rest without O2 and dropped to 90 at the lowest, when sat down on the side of stretcher pts O2 dropped to 88. Pt stated he was out of breath.

## 2023-11-26 NOTE — Progress Notes (Signed)
 Initial Nutrition Assessment  DOCUMENTATION CODES:   Obesity unspecified  INTERVENTION:  - Heart healthy diet per MD.  - Encourage intake at all meals.    NUTRITION DIAGNOSIS:   Increased nutrient needs related to acute illness as evidenced by estimated needs.  GOAL:   Patient will meet greater than or equal to 90% of their needs  MONITOR:   PO intake, Weight trends  REASON FOR ASSESSMENT:   Consult Assessment of nutrition requirement/status  ASSESSMENT:   83 year old male with PMH COPD, CAD s/p CABG, aortic valve stenosis, gout, HLD, HTN, hypothyroidism, hearing impairment, and tobacco use disorder who presented with increased work of breath, wheezing which has been progressively getting worse. Admitted for COPD exacerbation.   RD working remotely. Patient noted to be on Bi-PAP at this time. Will obtain nutrition history on next in person visit.  Per chart review, only weight history over the past year is from June to now. Weights has increased significantly (16kg) since that time.   Patient currently on a heart healthy diet, no PO intake documented yet. Will monitor PO intake to determine need for ONS.   Medications reviewed and include: -  Labs reviewed: -   NUTRITION - FOCUSED PHYSICAL EXAM:  RD working remotely  Diet Order:   Diet Order             Diet Heart Room service appropriate? Yes; Fluid consistency: Thin  Diet effective now                   EDUCATION NEEDS:  No education needs have been identified at this time  Skin:  Skin Assessment: Reviewed RN Assessment  Last BM:  unknown  Height:  Ht Readings from Last 1 Encounters:  11/25/23 5' 9 (1.753 m)   Weight:  Wt Readings from Last 1 Encounters:  11/25/23 116.1 kg   Ideal Body Weight:  72.73 kg  BMI:  Body mass index is 37.8 kg/m.  Estimated Nutritional Needs:  Kcal:  2000-2350 kcals Protein:  90-110 grams Fluid:  >/= 2L    Trude Ned RD, LDN Contact via Secure  Chat.

## 2023-11-26 NOTE — Progress Notes (Signed)
 At 0930 RT found PT off all oxygen  with Sp02 85%. Placed PT back on 5 LPM and spoke with RN- RT will attempt ABG when PT has been on 02 (already determined PT is hypoxic on RA). PT is not in respiratory distress at this time and is alert and oriented. Sp02 currently 93% on 5 LPM [place].

## 2023-11-26 NOTE — Hospital Course (Addendum)
  83 year old man PMH COPD, CAD s/p CABG, aortic valve stenosis, gout, hyperlipidemia, hypothyroidism, hypertension, chronic thrombocytopenia, hearing impairment, and tobacco use disorder presented to [facility] with reports of increased work of breath, wheezing which has been progressively getting worse.  He does endorse a cough with small amount of sputum production, no color.  Denies chest pain or palpitations.  He reports that he still smokes half a pack a day.  No known sick contacts.  At presentation to ED patient O2 sat dropped below 87% on room air requiring 2 L of oxygen  for maintaining SPO2 91 to 93%.  Otherwise hemodynamically stable and afebrile. CBC unremarkable with elevated MCV and chronically low platelet count 129. BMP unremarkable. Chest x-ray no acute cardiopulmonary process.  In the ED patient has been treated with DuoNeb nebulizer, Decadron  10 mg and mag sulfate 2 g.  Hospitalist has been consulted for further evaluation management of COPD exacerbation.

## 2023-11-26 NOTE — ED Notes (Signed)
 Patient instructed to on safety and the use off the call system for help. Pt also instructed that he should not remove his oxygen .Call bell in reach. Bed in locked and low position with side rails up.

## 2023-11-26 NOTE — Progress Notes (Signed)
 RN aware PT is now on BiPAP (tolerating well). RN aware PT door must remain open or a system must be put in place to hear PT and or BiPAP alarms with door closed while PT utilizes BiPAP.

## 2023-11-26 NOTE — ED Provider Notes (Signed)
 [place] EMERGENCY DEPARTMENT AT [facility] Provider Note   CSN: [id number] Arrival date & time: 11/25/23  1916     Patient presents with: Shortness of Breath (HX of COPD smokes 60+ years, has been having wheezing and home nurse told them to go to urgent care, urgent care sent them to the ER. Has inhaler but doesn't know where it is.)   Jordan Hebert is a 83 y.o. male.  Patient with history of COPD, NSTEMI status post CABG, gout presents the emergency room complaining of shortness of breath.  He went to a walk-in clinic earlier today who recommended to come to the emergency department due to oxygen  desaturations.  He states he used to have an inhaler but does not know where it is.  He has not used any medications at home and urgent care did not prescribe any medications.  He denies fever, nausea, vomiting, chest pain.  He endorses shortness of breath with wheezing and cough.    Shortness of Breath      Prior to Admission medications   Medication Sig Start Date End Date Taking? Authorizing Provider  acetaminophen  (TYLENOL ) 325 MG tablet Take 2 tablets (650 mg total) by mouth every 6 (six) hours as needed for mild pain. 08/24/17   Lucien Orren SAILOR, PA-C  aspirin  EC 81 MG tablet Take 1 tablet (81 mg total) by mouth daily. 03/01/18   Meng, Hao, PA  baclofen (LIORESAL) 10 MG tablet 1 tablet as needed 04/22/21   [provider]  clopidogrel  (PLAVIX ) 75 MG tablet Take 1 tablet by mouth daily.    [provider]  colchicine (COLCRYS) 0.6 MG tablet 1 tablet    [provider]  cyclobenzaprine (FLEXERIL) 10 MG tablet Take 1 tablet by mouth 3 (three) times daily.    [provider]  diclofenac Sodium (VOLTAREN) 1 % GEL 1 application 12/09/20   [provider]  levothyroxine  (SYNTHROID , LEVOTHROID) 175 MCG tablet Take 175 mcg by mouth daily before breakfast.    [provider]  Magnesium  250 MG TABS Take 1 tablet by mouth daily.     [provider]  metoprolol  tartrate (LOPRESSOR ) 25 MG tablet Take 0.5 tablets (12.5 mg total) by mouth 2 (two) times daily. 08/24/17   Lucien Orren SAILOR, PA-C  NEOMYCIN-POLYMYXIN-HYDROCORTISONE (CORTISPORIN) 1 % SOLN OTIC solution 4 drops into affected ear    [provider]  oxyCODONE -acetaminophen  (PERCOCET/ROXICET) 5-325 MG tablet Take 1-2 tablets by mouth every 8 (eight) hours as needed for severe pain. 06/28/22   Patsey Lot, MD  rosuvastatin  (CRESTOR ) 40 MG tablet Take 1 tablet (40 mg total) by mouth daily. 10/21/21   Court Dorn PARAS, MD  sertraline  (ZOLOFT ) 50 MG tablet Take 50 mg by mouth daily.    [provider]  vitamin B-12 (CYANOCOBALAMIN ) 1000 MCG tablet Take 1,000 mcg by mouth daily.    [provider]    Allergies: Viagra [sildenafil]    Review of Systems  Respiratory:  Positive for shortness of breath.     Updated Vital Signs BP (!) 143/84 (BP Location: Right Arm)   Pulse 91   Temp (!) 97.5 F (36.4 C) (Oral)   Resp 17   Ht 5' 9 (1.753 m)   Wt 116.1 kg   SpO2 92%   BMI 37.80 kg/m   Physical Exam Vitals and nursing note reviewed.  Constitutional:      General: He is not in acute distress.    Appearance: He is well-developed.  HENT:     Head: Normocephalic and atraumatic.  Eyes:     Conjunctiva/sclera: Conjunctivae normal.  Cardiovascular:     Rate and Rhythm: Normal rate and regular rhythm.     Heart sounds: No murmur heard. Pulmonary:     Effort: Pulmonary effort is normal. No respiratory distress.     Breath sounds: Wheezing present.  Abdominal:     Palpations: Abdomen is soft.     Tenderness: There is no abdominal tenderness.  Musculoskeletal:        General: No swelling.     Cervical back: Neck supple.  Skin:    General: Skin is warm and dry.     Capillary Refill: Capillary refill takes less than 2 seconds.  Neurological:     Mental Status: He is alert.  Psychiatric:        Mood and Affect: Mood normal.      (all labs ordered are listed, but only abnormal results are displayed) Labs Reviewed  BASIC METABOLIC PANEL WITH GFR - Abnormal; Notable for the following components:      Result Value   Chloride 95 (*)    Glucose, Bld 100 (*)    All other components within normal limits  CBC - Abnormal; Notable for the following components:   MCV 101.3 (*)    Platelets 129 (*)    All other components within normal limits    EKG: None  Radiology: DG Chest 2 View Result Date: 11/25/2023 CLINICAL DATA:  Shortness of breath EXAM: CHEST - 2 VIEW COMPARISON:  Chest x-ray 06/28/2022 FINDINGS: Patient is status post heart valve replacement, unchanged. The heart size and mediastinal contours are within normal limits. Both lungs are clear. The visualized skeletal structures are unremarkable. IMPRESSION: No active cardiopulmonary disease. Electronically Signed   By: Greig Pique M.D.   On: 11/25/2023 19:59     .Critical Care  Performed by: Logan Ubaldo NOVAK, PA-C Authorized by: Logan Ubaldo NOVAK, PA-C   Critical care provider statement:    Critical care time (minutes):  30   Critical care time was exclusive of:  Separately billable procedures and treating other patients   Critical care was necessary to treat or prevent imminent or life-threatening deterioration of the following conditions:  Respiratory failure   Critical care was time spent personally by me on the following activities:  Development of treatment plan with patient or surrogate, discussions with consultants, evaluation of patient's response to treatment, examination of patient, ordering and review of laboratory studies, ordering and review of radiographic studies, ordering and performing treatments and interventions, pulse oximetry, re-evaluation of patient's condition and review of old charts   Care discussed with: admitting provider      Medications Ordered in the ED  ipratropium-albuterol  (DUONEB) 0.5-2.5 (3) MG/3ML nebulizer  solution 3 mL (has no administration in time range)  ipratropium-albuterol  (DUONEB) 0.5-2.5 (3) MG/3ML nebulizer solution 3 mL (3 mLs Nebulization Given 11/25/23 2307)  dexamethasone  (DECADRON ) injection 10 mg (10 mg Intravenous Given 11/25/23 2308)  magnesium  sulfate IVPB 2 g 50 mL (0 g Intravenous Stopped 11/26/23 0007)                                    Medical Decision Making Amount and/or Complexity of Data Reviewed Labs: ordered. Radiology: ordered.  Risk Prescription drug management. Decision regarding hospitalization.   This patient presents to the ED for concern of shortness of breath, this involves  an extensive number of treatment options, and is a complaint that carries with it a high risk of complications and morbidity.  The differential diagnosis includes COPD exacerbation, pneumonia, PE, others   Co morbidities / Chronic conditions that complicate the patient evaluation  COPD   Additional history obtained:  Additional history obtained from EMR External records from outside source obtained and reviewed including primary care note/summary   Lab Tests:  I Ordered, and personally interpreted labs.  The pertinent results include: No leukocytosis   Imaging Studies ordered:  I ordered imaging studies including chest x-ray I independently visualized and interpreted imaging which showed no acute findings I agree with the radiologist interpretation   Cardiac Monitoring: / EKG:  The patient was maintained on a cardiac monitor.  I personally viewed and interpreted the cardiac monitored which showed an underlying rhythm of: Sinus rhythm   Problem List / ED Course / Critical interventions / Medication management   I ordered medication including magnesium , DuoNeb, Decadron  Reevaluation of the patient after these medicines showed that the patient had improvement in wheezing but continued to have work of breathing and hypoxia    Consultations Obtained:  I  requested consultation with the hospitalist, Dr. Sundil,  and discussed lab and imaging findings as well as pertinent plan - they recommend: admission   Social Determinants of Health:  Patient is a daily 1/2 pack smoker   Test / Admission - Considered:  Patient with COPD exacerbation. Widespread wheezing on examination. Patient ambulated but became increasingly short of breath and upon sitting had SpO2 of 88%.   Patient is responding to treatments but continues to have new oxygen  requirement.  At this time I feel patient would benefit from admission to the hospital for further management.      Final diagnoses:  COPD exacerbation [facility])    ED Discharge Orders     None          Logan Ubaldo KATHEE DEVONNA 11/26/23 0259    Palumbo, April, MD 11/26/23 [id number]    Nettie Earing, MD 11/26/23 [id number]

## 2023-11-26 NOTE — ED Notes (Signed)
 Pt found sleeping with sats in the 70's. After patient was awake sats to mid 80s. Pt given time to recover with minimum improvement. Pt now on 5L. Sats 92%. Respiratory and MD notified. Coarse wheezes heard throughout.

## 2023-11-27 DIAGNOSIS — J441 Chronic obstructive pulmonary disease with (acute) exacerbation: Secondary | ICD-10-CM | POA: Diagnosis not present

## 2023-11-27 DIAGNOSIS — J9601 Acute respiratory failure with hypoxia: Secondary | ICD-10-CM | POA: Diagnosis not present

## 2023-11-27 DIAGNOSIS — I350 Nonrheumatic aortic (valve) stenosis: Secondary | ICD-10-CM | POA: Diagnosis not present

## 2023-11-27 DIAGNOSIS — I2510 Atherosclerotic heart disease of native coronary artery without angina pectoris: Secondary | ICD-10-CM | POA: Diagnosis not present

## 2023-11-27 DIAGNOSIS — J9602 Acute respiratory failure with hypercapnia: Secondary | ICD-10-CM

## 2023-11-27 DIAGNOSIS — M170 Bilateral primary osteoarthritis of knee: Secondary | ICD-10-CM | POA: Diagnosis not present

## 2023-11-27 LAB — POTASSIUM: Potassium: 5.8 mmol/L — ABNORMAL HIGH (ref 3.5–5.1)

## 2023-11-27 LAB — CBC
HCT: 44.5 % (ref 39.0–52.0)
Hemoglobin: 14.3 g/dL (ref 13.0–17.0)
MCH: 33.3 pg (ref 26.0–34.0)
MCHC: 32.1 g/dL (ref 30.0–36.0)
MCV: 103.5 fL — ABNORMAL HIGH (ref 80.0–100.0)
Platelets: 141 K/uL — ABNORMAL LOW (ref 150–400)
RBC: 4.3 MIL/uL (ref 4.22–5.81)
RDW: 14.4 % (ref 11.5–15.5)
WBC: 12.9 K/uL — ABNORMAL HIGH (ref 4.0–10.5)
nRBC: 0 % (ref 0.0–0.2)

## 2023-11-27 LAB — BASIC METABOLIC PANEL WITH GFR
Anion gap: 8 (ref 5–15)
BUN: 23 mg/dL (ref 8–23)
CO2: 31 mmol/L (ref 22–32)
Calcium: 9.4 mg/dL (ref 8.9–10.3)
Chloride: 93 mmol/L — ABNORMAL LOW (ref 98–111)
Creatinine, Ser: 0.8 mg/dL (ref 0.61–1.24)
GFR, Estimated: >60 mL/min (ref >60)
Glucose, Bld: 171 mg/dL — ABNORMAL HIGH (ref 70–99)
Potassium: 5.6 mmol/L — ABNORMAL HIGH (ref 3.5–5.1)
Sodium: 132 mmol/L — ABNORMAL LOW (ref 135–145)

## 2023-11-27 MED ORDER — SODIUM ZIRCONIUM CYCLOSILICATE 10 G PO PACK
10.0000 g | PACK | ORAL | Status: AC
  Administered 2023-11-27 – 2023-11-28 (×3): 10 g via ORAL
  Filled 2023-11-27 (×3): qty 1

## 2023-11-27 MED ORDER — IPRATROPIUM-ALBUTEROL 0.5-2.5 (3) MG/3ML IN SOLN
3.0000 mL | Freq: Three times a day (TID) | RESPIRATORY_TRACT | Status: DC
  Administered 2023-11-27 – 2023-12-07 (×31): 3 mL via RESPIRATORY_TRACT
  Filled 2023-11-27 (×31): qty 3

## 2023-11-27 MED ORDER — SODIUM ZIRCONIUM CYCLOSILICATE 10 G PO PACK
10.0000 g | PACK | ORAL | Status: AC
  Administered 2023-11-27 (×2): 10 g via ORAL
  Filled 2023-11-27 (×2): qty 1

## 2023-11-27 MED ORDER — DOXYCYCLINE HYCLATE 100 MG PO TABS
100.0000 mg | ORAL_TABLET | Freq: Two times a day (BID) | ORAL | Status: AC
  Administered 2023-11-27 – 2023-12-03 (×14): 100 mg via ORAL
  Filled 2023-11-27 (×14): qty 1

## 2023-11-27 MED ORDER — VITAMIN B-12 1000 MCG PO TABS
1000.0000 ug | ORAL_TABLET | Freq: Every day | ORAL | Status: DC
  Administered 2023-11-27 – 2023-12-07 (×11): 1000 ug via ORAL
  Filled 2023-11-27 (×11): qty 1

## 2023-11-27 NOTE — Progress Notes (Signed)
 [facility] PROGRESS NOTE   Jordan Hebert FMW:[id number] DOB: [birth year] DOA: 11/25/2023  PCP: Stacia Millman, PA  Brief History: 83 year old man PMH COPD, CAD s/p CABG, aortic valve stenosis, gout, hyperlipidemia, hypothyroidism, hypertension, chronic thrombocytopenia, hearing impairment, and tobacco use disorder presented to [facility] with reports of increased work of breath, wheezing which has been progressively getting worse.  Patient was hospitalized for further management of COPD exacerbation.  Consultants: None  Procedures: None    Subjective/Interval History: Patient mentioned that he is feeling much better this morning.  Had a reasonable night sleep last night.  Shortness of breath is improved.  Denies any chest pain.  Feels hungry this morning.    Assessment/Plan:  COPD with acute exacerbation/acute respiratory failure with hypoxia and hypercapnia Seems to be slightly better today.  BiPAP was ordered which she used just for couple hours yesterday evening but did not use it overnight.  Work of breathing appears to have improved. COVID-19 flu and RSV PCR negative. Continue Solu-Medrol  nebulizer treatments.  He is on budesonide . Continue doxycycline . If he does not require BiPAP during the day today should be able to be transferred to the floor with perhaps nighttime BiPAP. Wean down oxygen . Chest x-ray did not show any acute findings.  Coronary artery disease status post CABG Cardiac status is stable.  Continue aspirin  Plavix .  He is noted to be on metoprolol .  He is on statin.  Hyperkalemia Potassium level noted to be 5.6 today.  Will order Lokelma .  Recheck labs later today.  Chronic thrombocytopenia Stable.  Tobacco use disorder Counseled.  Class II obesity Estimated body mass index is 38.45 kg/m as calculated from the following:   Height as of this encounter: 5' 9 (1.753 m).   Weight as of this encounter: 118.1 kg.  DVT Prophylaxis:  Lovenox  Code Status: Full code Family Communication: Discussed with the patient Disposition Plan: Hopefully home when improved.  PT and OT eval.  Mobilize.  Out of bed to chair.  Status is: Inpatient Remains inpatient appropriate because: COPD with acute exacerbation      Medications: Scheduled:  aspirin  EC  81 mg Oral Daily   budesonide  (PULMICORT ) nebulizer solution  0.25 mg Nebulization BID   Chlorhexidine  Gluconate Cloth  6 each Topical Daily   clopidogrel   75 mg Oral Daily   enoxaparin  (LOVENOX ) injection  60 mg Subcutaneous Q24H   guaiFENesin   1,200 mg Oral BID   ipratropium-albuterol   3 mL Nebulization TID   ketoconazole    Topical BID   levothyroxine   150 mcg Oral QAC breakfast   methylPREDNISolone  (SOLU-MEDROL ) injection  40 mg Intravenous Q12H   metoprolol  tartrate  12.5 mg Oral BID   nicotine   14 mg Transdermal Daily   nystatin    Topical TID   rosuvastatin   40 mg Oral Daily   sertraline   50 mg Oral Daily   sodium zirconium cyclosilicate   10 g Oral Q4H   Continuous: PRN:albuterol , melatonin, nicotine  polacrilex, mouth rinse, pneumococcal 20-valent conjugate vaccine, sodium chloride   Antibiotics: Anti-infectives (From admission, onward)    None       Objective:  Vital Signs  Vitals:   11/27/23 0502 11/27/23 0700 11/27/23 0728 11/27/23 0849  BP: 117/63 (!) 109/54    Pulse: 65 65 76   Resp: 18 19 19    Temp:    98.8 F (37.1 C)  TempSrc:    Oral  SpO2: 90% 94% 94%   Weight:      Height:  Intake/Output Summary (Last 24 hours) at 11/27/2023 0855 Last data filed at 11/27/2023 0851 Gross per 24 hour  Intake 360 ml  Output 2250 ml  Net -1890 ml   Filed Weights   11/25/23 1936 11/25/23 2209 11/26/23 1615  Weight: 116.1 kg 116.1 kg 118.1 kg    General appearance: Awake alert.  In no distress Resp: Proved effort compared to yesterday though still tachypneic.  No use of accessory muscles.  Diffuse wheezing bilaterally.  Crackles at the bases.  No  rhonchi. Cardio: S1-S2 is normal regular.  No S3-S4.  No rubs murmurs or bruit GI: Abdomen is soft.  Nontender nondistended.  Bowel sounds are present normal.  No masses organomegaly Extremities: No edema.  Moving all of his extremities.  Physical deconditioning is noted. Neurologic: Alert and oriented x3.  No focal neurological deficits.    Lab Results:  Data Reviewed: I have personally reviewed following labs and reports of the imaging studies  CBC: Recent Labs  Lab 11/25/23 1947 11/26/23 1626 11/27/23 0305  WBC 5.6 8.5 12.9*  HGB 14.7 15.1 14.3  HCT 46.7 48.1 44.5  MCV 101.3* 103.4* 103.5*  PLT 129* 133* 141*    Basic Metabolic Panel: Recent Labs  Lab 11/25/23 1947 11/26/23 0853 11/27/23 0305  NA 135 136 132*  K 4.5 5.0 5.6*  CL 95* 95* 93*  CO2 29 31 31   GLUCOSE 100* 151* 171*  BUN 16 19 23   CREATININE 0.85 0.87 0.80  CALCIUM  9.4 9.5 9.4  MG  --  2.7*  --     GFR: Estimated Creatinine Clearance: 90.3 mL/min (by C-G formula based on SCr of 0.8 mg/dL).  Liver Function Tests: Recent Labs  Lab 11/26/23 0853  AST 28  ALT 22  ALKPHOS 45  BILITOT 0.4  PROT 7.4  ALBUMIN  4.3     Recent Results (from the past 240 hours)  Resp panel by RT-PCR (RSV, Flu A&B, Covid) Anterior Nasal Swab     Status: None   Collection Time: 11/26/23 12:30 PM   Specimen: Anterior Nasal Swab  Result Value Ref Range Status   SARS Coronavirus 2 by RT PCR NEGATIVE NEGATIVE Final    Comment: (NOTE) SARS-CoV-2 target nucleic acids are NOT DETECTED.  The SARS-CoV-2 RNA is generally detectable in upper respiratory specimens during the acute phase of infection. The lowest concentration of SARS-CoV-2 viral copies this assay can detect is 138 copies/mL. A negative result does not preclude SARS-Cov-2 infection and should not be used as the sole basis for treatment or other patient management decisions. A negative result may occur with  improper specimen collection/handling, submission  of specimen other than nasopharyngeal swab, presence of viral mutation(s) within the areas targeted by this assay, and inadequate number of viral copies(<138 copies/mL). A negative result must be combined with clinical observations, patient history, and epidemiological information. The expected result is Negative.  Fact Sheet for Patients:  BloggerCourse.com  Fact Sheet for Healthcare Providers:  SeriousBroker.it  This test is no t yet approved or cleared by the [place]  FDA and  has been authorized for detection and/or diagnosis of SARS-CoV-2 by FDA under an Emergency Use Authorization (EUA). This EUA will remain  in effect (meaning this test can be used) for the duration of the COVID-19 declaration under Section 564(b)(1) of the Act, 21 U.S.C.section 360bbb-3(b)(1), unless the authorization is terminated  or revoked sooner.       Influenza A by PCR NEGATIVE NEGATIVE Final   Influenza B by PCR NEGATIVE  NEGATIVE Final    Comment: (NOTE) The Xpert Xpress SARS-CoV-2/FLU/RSV plus assay is intended as an aid in the diagnosis of influenza from Nasopharyngeal swab specimens and should not be used as a sole basis for treatment. Nasal washings and aspirates are unacceptable for Xpert Xpress SARS-CoV-2/FLU/RSV testing.  Fact Sheet for Patients: BloggerCourse.com  Fact Sheet for Healthcare Providers: SeriousBroker.it  This test is not yet approved or cleared by the [place]  FDA and has been authorized for detection and/or diagnosis of SARS-CoV-2 by FDA under an Emergency Use Authorization (EUA). This EUA will remain in effect (meaning this test can be used) for the duration of the COVID-19 declaration under Section 564(b)(1) of the Act, 21 U.S.C. section 360bbb-3(b)(1), unless the authorization is terminated or revoked.     Resp Syncytial Virus by PCR NEGATIVE NEGATIVE  Final    Comment: (NOTE) Fact Sheet for Patients: BloggerCourse.com  Fact Sheet for Healthcare Providers: SeriousBroker.it  This test is not yet approved or cleared by the [place]  FDA and has been authorized for detection and/or diagnosis of SARS-CoV-2 by FDA under an Emergency Use Authorization (EUA). This EUA will remain in effect (meaning this test can be used) for the duration of the COVID-19 declaration under Section 564(b)(1) of the Act, 21 U.S.C. section 360bbb-3(b)(1), unless the authorization is terminated or revoked.  Performed at [facility], [address] [place], [place] [address]   MRSA Next Gen by PCR, Nasal     Status: None   Collection Time: 11/26/23  5:01 PM   Specimen: Nasal Mucosa; Nasal Swab  Result Value Ref Range Status   MRSA by PCR Next Gen NOT DETECTED NOT DETECTED Final    Comment: (NOTE) The GeneXpert MRSA Assay (FDA approved for NASAL specimens only), is one component of a comprehensive MRSA colonization surveillance program. It is not intended to diagnose MRSA infection nor to guide or monitor treatment for MRSA infections. Test performance is not FDA approved in patients less than 58 years old. Performed at [facility], [address] [place], [place] [address]       Radiology Studies: DG Chest 2 View Result Date: 11/25/2023 CLINICAL DATA:  Shortness of breath EXAM: CHEST - 2 VIEW COMPARISON:  Chest x-ray 06/28/2022 FINDINGS: Patient is status post heart valve replacement, unchanged. The heart size and mediastinal contours are within normal limits. Both lungs are clear. The visualized skeletal structures are unremarkable. IMPRESSION: No active cardiopulmonary disease. Electronically Signed   By: Greig Pique M.D.   On: 11/25/2023 19:59       LOS: 1 day   Jordan Hebert  [facility] Pager on [email or web address]  11/27/2023, 8:55 AM

## 2023-11-27 NOTE — Evaluation (Signed)
 Physical Therapy Evaluation Patient Details Name: Jordan Hebert MRN: [id number] DOB: 1940/12/02 Today's Date: 11/27/2023  History of Present Illness  Pt admitted from home2* SOB with COPD exacerbation.  Pt with hx of CAD, s/p CABG, aortic valve replacement, and spinal stenosis  Clinical Impression  Pt admitted as above and presenting with functional mobility limitations 2* generalized weakness/deconditioning, ambulatory balance deficits and decreased activity tolerance.  Pt should progress to dc home with family assist.  Pt reports he is active with [facility] PT/OT services and would like to continue same after dc.        If plan is discharge home, recommend the following: A little help with walking and/or transfers;A little help with bathing/dressing/bathroom;Assistance with cooking/housework;Assist for transportation   Can travel by private vehicle        Equipment Recommendations None recommended by PT  Recommendations for Other Services       Functional Status Assessment Patient has had a recent decline in their functional status and demonstrates the ability to make significant improvements in function in a reasonable and predictable amount of time.     Precautions / Restrictions Precautions Precautions: Fall Restrictions Weight Bearing Restrictions Per Provider Order: No      Mobility  Bed Mobility Overal bed mobility: Needs Assistance Bed Mobility: Supine to Sit     Supine to sit: Min assist     General bed mobility comments: use of bed rail with min assist to bring trunk to upright    Transfers Overall transfer level: Needs assistance Equipment used: Rolling walker (2 wheels) Transfers: Sit to/from Stand Sit to Stand: Min assist           General transfer comment: cues for LE management and use of UEs to self assist    Ambulation/Gait Ambulation/Gait assistance: Min assist Gait Distance (Feet): 170 Feet Assistive device: Rolling walker (2 wheels) Gait  Pattern/deviations: Step-through pattern, Decreased step length - right, Decreased step length - left, Shuffle, Trunk flexed       General Gait Details: cues for posture and position from [employer]            Wheelchair Mobility     Tilt Bed    Modified Rankin (Stroke Patients Only)       Balance Overall balance assessment: Needs assistance Sitting-balance support: No upper extremity supported, Feet supported Sitting balance-Leahy Scale: Good     Standing balance support: Single extremity supported Standing balance-Leahy Scale: Poor                               Pertinent Vitals/Pain Pain Assessment Pain Assessment: No/denies pain    Home Living Family/patient expects to be discharged to:: Private residence Living Arrangements: Spouse/significant other Available Help at Discharge: Family;Available 24 hours/day Type of Home: House Home Access: Stairs to enter Entrance Stairs-Rails: Right;Left;Can reach both Entrance Stairs-Number of Steps: 6   Home Layout: One level Home Equipment: Agricultural consultant (2 wheels);Cane - single point Additional Comments: Pt states wife is largely bed bound and they live with dtr    Prior Function Prior Level of Function : Independent/Modified Independent             Mobility Comments: Limited HH ambulator - used electic scooters as available in community       Extremity/Trunk Assessment   Upper Extremity Assessment Upper Extremity Assessment: Overall WFL for tasks assessed    Lower Extremity Assessment Lower Extremity Assessment: Generalized  weakness    Cervical / Trunk Assessment Cervical / Trunk Assessment: Kyphotic  Communication   Communication Communication: Impaired Factors Affecting Communication: Hearing impaired    Cognition Arousal: Alert Behavior During Therapy: WFL for tasks assessed/performed                             Following commands: Intact       Cueing Cueing  Techniques: Verbal cues     General Comments      Exercises     Assessment/Plan    PT Assessment Patient needs continued PT services  PT Problem List Decreased strength;Decreased activity tolerance;Decreased balance;Decreased mobility;Decreased knowledge of use of DME;Obesity       PT Treatment Interventions DME instruction;Gait training;Stair training;Functional mobility training;Therapeutic activities;Therapeutic exercise;Balance training;Patient/family education    PT Goals (Current goals can be found in the Care Plan section)  Acute Rehab PT Goals Patient Stated Goal: Regain IND to return to prior living arrangement PT Goal Formulation: With patient Time For Goal Achievement: 12/11/23 Potential to Achieve Goals: Good    Frequency Min 2X/week     Co-evaluation               AM-PAC PT 6 Clicks Mobility  Outcome Measure Help needed turning from your back to your side while in a flat bed without using bedrails?: A Little Help needed moving from lying on your back to sitting on the side of a flat bed without using bedrails?: A Little Help needed moving to and from a bed to a chair (including a wheelchair)?: A Little Help needed standing up from a chair using your arms (e.g., wheelchair or bedside chair)?: A Little Help needed to walk in hospital room?: A Little Help needed climbing 3-5 steps with a railing? : A Lot 6 Click Score: 17    End of Session Equipment Utilized During Treatment: Gait belt;Oxygen  Activity Tolerance: Patient tolerated treatment well Patient left: in chair;with call bell/phone within reach;with chair alarm set Nurse Communication: Mobility status PT Visit Diagnosis: Difficulty in walking, not elsewhere classified (R26.2)    Time: 9047-8981 PT Time Calculation (min) (ACUTE ONLY): 26 min   Charges:   PT Evaluation $PT Eval Low Complexity: 1 Low PT Treatments $Gait Training: 8-22 mins PT General Charges $$ ACUTE PT VISIT: 1 Visit          [facility] PT Acute Rehabilitation Services Office [phone]   Horton Ellithorpe 11/27/2023, 4:09 PM

## 2023-11-28 DIAGNOSIS — J441 Chronic obstructive pulmonary disease with (acute) exacerbation: Secondary | ICD-10-CM | POA: Diagnosis not present

## 2023-11-28 DIAGNOSIS — J9601 Acute respiratory failure with hypoxia: Secondary | ICD-10-CM | POA: Diagnosis not present

## 2023-11-28 DIAGNOSIS — J9602 Acute respiratory failure with hypercapnia: Secondary | ICD-10-CM | POA: Diagnosis not present

## 2023-11-28 LAB — BLOOD GAS, VENOUS
Acid-Base Excess: 10.8 mmol/L — ABNORMAL HIGH (ref 0.0–2.0)
Acid-Base Excess: 10.6 mmol/L — ABNORMAL HIGH (ref 0.0–2.0)
Bicarbonate: 41.4 mmol/L — ABNORMAL HIGH (ref 20.0–28.0)
Bicarbonate: 40.8 mmol/L — ABNORMAL HIGH (ref 20.0–28.0)
Drawn by: 71535
O2 Saturation: 90.3 %
O2 Saturation: 90 %
Patient temperature: 37
Patient temperature: 36.6
pCO2, Ven: 88 mmHg (ref 44–60)
pCO2, Ven: 80 mmHg (ref 44–60)
pH, Ven: 7.28 (ref 7.25–7.43)
pH, Ven: 7.32 (ref 7.25–7.43)
pO2, Ven: 60 mmHg — ABNORMAL HIGH (ref 32–45)
pO2, Ven: 57 mmHg — ABNORMAL HIGH (ref 32–45)

## 2023-11-28 LAB — BASIC METABOLIC PANEL WITH GFR
Anion gap: 8 (ref 5–15)
BUN: 30 mg/dL — ABNORMAL HIGH (ref 8–23)
CO2: 34 mmol/L — ABNORMAL HIGH (ref 22–32)
Calcium: 9 mg/dL (ref 8.9–10.3)
Chloride: 93 mmol/L — ABNORMAL LOW (ref 98–111)
Creatinine, Ser: 0.79 mg/dL (ref 0.61–1.24)
GFR, Estimated: >60 mL/min (ref >60)
Glucose, Bld: 170 mg/dL — ABNORMAL HIGH (ref 70–99)
Potassium: 5.1 mmol/L (ref 3.5–5.1)
Sodium: 135 mmol/L (ref 135–145)

## 2023-11-28 LAB — CBC
HCT: 44.4 % (ref 39.0–52.0)
Hemoglobin: 14 g/dL (ref 13.0–17.0)
MCH: 33.4 pg (ref 26.0–34.0)
MCHC: 31.5 g/dL (ref 30.0–36.0)
MCV: 106 fL — ABNORMAL HIGH (ref 80.0–100.0)
Platelets: 139 K/uL — ABNORMAL LOW (ref 150–400)
RBC: 4.19 MIL/uL — ABNORMAL LOW (ref 4.22–5.81)
RDW: 14.4 % (ref 11.5–15.5)
WBC: 12.4 K/uL — ABNORMAL HIGH (ref 4.0–10.5)
nRBC: 0 % (ref 0.0–0.2)

## 2023-11-28 MED ORDER — SODIUM ZIRCONIUM CYCLOSILICATE 10 G PO PACK
10.0000 g | PACK | Freq: Once | ORAL | Status: AC
  Administered 2023-11-28: 10 g via ORAL
  Filled 2023-11-28: qty 1

## 2023-11-28 MED ORDER — ORAL CARE MOUTH RINSE: 15.0000 mL | OROMUCOSAL | Status: DC | PRN

## 2023-11-28 MED ORDER — ORAL CARE MOUTH RINSE
15.0000 mL | OROMUCOSAL | Status: DC
  Administered 2023-11-28 – 2023-12-07 (×31): 15 mL via OROMUCOSAL

## 2023-11-28 NOTE — Progress Notes (Signed)
   11/28/23 2038  BiPAP/CPAP/SIPAP  BiPAP/CPAP/SIPAP Pt Type Adult  BiPAP/CPAP/SIPAP V60  Mask Type Full face mask  Dentures removed? Yes - Placed in denture cup  Mask Size Large  Set Rate 18 breaths/min  Respiratory Rate 19 breaths/min  IPAP 20 cmH20  EPAP 6 cmH2O  FiO2 (%) 40 %  Minute Ventilation 8.1  Leak 7  Peak Inspiratory Pressure (PIP) 20  Tidal Volume (Vt) 478  Patient Home Machine No  Patient Home Mask No  Patient Home Tubing No  Auto Titrate No  Press High Alarm 30 cmH2O  Press Low Alarm 5 cmH2O  Device Plugged into RED Power Outlet Yes

## 2023-11-28 NOTE — Evaluation (Signed)
 Occupational Therapy Evaluation Patient Details Name: Jordan Hebert MRN: [id number] DOB: 01-13-1941 Today's Date: 11/28/2023   History of Present Illness   Pt admitted from home2* SOB with COPD exacerbation.  Pt with hx of CAD, s/p CABG, aortic valve replacement, and spinal stenosis     Clinical Impressions PTA, patient lives with disabled wife in home with support from daughter and hired pca services 2-3 times per week for wife. Patient was not on home O2 at baseline but reports he was receiving HHPT and was driving with use of motorized cart at grocery store when available. Currently, patient presents with deficits outlined below (see OT Problem List for details) most significantly O2 dep, increased body habitus, LE edema and strength, decreased activity tolerance and balance for BADL's and functional mobility. Patient requires continued Acute care hospital level OT services to progress safety and functional performance and allow for discharge. With support and assistance from caregivers, anticipate patient will benefit from [facility] services and tub transfer bench upon d/c. Will provide written handout on how to obtain if agreeable.       If plan is discharge home, recommend the following:   A lot of help with walking and/or transfers;A lot of help with bathing/dressing/bathroom;Assistance with cooking/housework;Direct supervision/assist for medications management;Direct supervision/assist for financial management;Assist for transportation;Help with stairs or ramp for entrance     Functional Status Assessment   Patient has had a recent decline in their functional status and demonstrates the ability to make significant improvements in function in a reasonable and predictable amount of time.     Equipment Recommendations   Tub/shower bench      Precautions/Restrictions   Precautions Precautions: Fall Restrictions Weight Bearing Restrictions Per Provider Order: No      Mobility Bed Mobility Overal bed mobility:  (was up in recliner in supine and assisted upright but remained in recliner after session)                  Transfers Overall transfer level: Needs assistance Equipment used: Rolling walker (2 wheels) Transfers: Sit to/from Stand Sit to Stand: Min assist           General transfer comment: mod cues her hand placement      Balance Overall balance assessment: Needs assistance Sitting-balance support: No upper extremity supported, Feet supported Sitting balance-Leahy Scale: Good     Standing balance support: Bilateral upper extremity supported Standing balance-Leahy Scale: Poor                             ADL either performed or assessed with clinical judgement   ADL Overall ADL's : Needs assistance/impaired Eating/Feeding: Set up;Sitting   Grooming: Wash/dry hands;Wash/dry face;Sitting;Set up   Upper Body Bathing: Set up;Sitting   Lower Body Bathing: Maximal assistance;Sit to/from stand   Upper Body Dressing : Minimal assistance;Sitting   Lower Body Dressing: Maximal assistance;Sit to/from stand       Toileting- Architect and Hygiene: Moderate assistance;Sit to/from stand Toileting - Clothing Manipulation Details (indicate cue type and reason): has Purewick currently     Functional mobility during ADLs: Minimal assistance;Rolling walker (2 wheels) General ADL Comments: cues for pacing and breathing strategies with all ADL's     Vision Baseline Vision/History: 0 No visual deficits;1 Wears glasses Ability to See in Adequate Light: 0 Adequate Patient Visual Report: No change from baseline Vision Assessment?: No apparent visual deficits;Wears glasses for reading;Wears glasses for driving  Pertinent Vitals/Pain Pain Assessment Pain Assessment: No/denies pain     Extremity/Trunk Assessment Upper Extremity Assessment Upper Extremity Assessment: Overall WFL for tasks  assessed;Right hand dominant   Lower Extremity Assessment Lower Extremity Assessment: Defer to PT evaluation   Cervical / Trunk Assessment Cervical / Trunk Assessment: Kyphotic   Communication Communication Communication: Impaired Factors Affecting Communication: Hearing impaired   Cognition Arousal: Alert Behavior During Therapy: WFL for tasks assessed/performed Cognition: No apparent impairments             OT - Cognition Comments: mild increased processing time                 Following commands: Intact       Cueing  General Comments   Cueing Techniques: Verbal cues  B compression hose with zippers in place, RR fluctuates from 18-28 depending on effort and task, 4 ltrs O2 via [place] with SpO2 from 87-92%   Exercises Exercises: Other exercises (breathing ex with IS and flutter 10 reps)        Home Living Family/patient expects to be discharged to:: Private residence Living Arrangements: Spouse/significant other Available Help at Discharge: Family;Available 24 hours/day Type of Home: House Home Access: Stairs to enter [employer] of Steps: 6 Entrance Stairs-Rails: Right;Left;Can reach both Home Layout: One level     Bathroom Shower/Tub: Tub/shower unit;Curtain   Bathroom Toilet: Handicapped height Bathroom Accessibility: No   Home Equipment: Cane - single Librarian, academic (2 wheels);Shower seat   Additional Comments: now patient reporting he and his relatively bed bound wife live at home alone and daughter comes by about 1x per day vs lives with dtr he reported to PT, and reports wife gets 2-3 x per week pca services      Prior Functioning/Environment Prior Level of Function : Independent/Modified Independent             Mobility Comments: Limited HH ambulator - used Investment banker, corporate scooters as available in community ADLs Comments: indep with BADL's and some IADL's but has some support by daughter and assist with support stockings     OT Problem List: Decreased strength;Decreased activity tolerance;Impaired balance (sitting and/or standing);Decreased knowledge of use of DME or AE;Decreased knowledge of precautions;Cardiopulmonary status limiting activity;Obesity;Increased edema   OT Treatment/Interventions: Self-care/ADL training;Therapeutic exercise;Energy conservation;DME and/or AE instruction;Therapeutic activities;Patient/family education;Balance training      OT Goals(Current goals can be found in the care plan section)   Acute Rehab OT Goals Patient Stated Goal: to get stronger OT Goal Formulation: With patient Time For Goal Achievement: 12/12/23 Potential to Achieve Goals: Fair ADL Goals Pt Will Perform Lower Body Bathing: with set-up;with adaptive equipment;sit to/from stand Pt Will Perform Lower Body Dressing: sit to/from stand;with adaptive equipment;with min assist Pt Will Transfer to Toilet: with contact guard assist;regular height toilet;grab bars Pt Will Perform Toileting - Clothing Manipulation and hygiene: with set-up;sit to/from stand Pt Will Perform Tub/Shower Transfer: with min assist;tub bench;rolling walker;Tub transfer Pt/caregiver will Perform Home Exercise Program: Both right and left upper extremity;With written HEP provided;Independently Additional ADL Goal #1: Patient will teach back 4/5 ECT principles with ADL's and mobility with min cues   OT Frequency:  Min 2X/week       AM-PAC OT 6 Clicks Daily Activity     Outcome Measure Help from another person eating meals?: A Little Help from another person taking care of personal grooming?: A Little Help from another person toileting, which includes using toliet, bedpan, or urinal?: A Lot Help from another person bathing (  including washing, rinsing, drying)?: A Lot Help from another person to put on and taking off regular upper body clothing?: A Lot Help from another person to put on and taking off regular lower body clothing?: A Lot 6  Click Score: 14   End of Session Equipment Utilized During Treatment: Gait belt;Rolling walker (2 wheels);Oxygen  Nurse Communication: Mobility status  Activity Tolerance: Patient tolerated treatment well Patient left: in chair;with call bell/phone within reach;with chair alarm set  OT Visit Diagnosis: Unsteadiness on feet (R26.81);Muscle weakness (generalized) (M62.81)                Time: 1555-1630 OT Time Calculation (min): 35 min Charges:  OT General Charges $OT Visit: 1 Visit OT Evaluation $OT Eval Low Complexity: 1 Low OT Treatments $Self Care/Home Management : 8-22 mins Arsenia Goracke OT/L Acute Rehabilitation Department  [phone]  11/28/2023, 4:36 PM

## 2023-11-28 NOTE — Progress Notes (Signed)
 [facility] PROGRESS NOTE   Jordan Hebert FMW:[id number] DOB: Feb 22, 1941 DOA: 11/25/2023  PCP: Stacia Millman, PA  Brief History: 83 year old man PMH COPD, CAD s/p CABG, aortic valve stenosis, gout, hyperlipidemia, hypothyroidism, hypertension, chronic thrombocytopenia, hearing impairment, and tobacco use disorder presented to [facility] with reports of increased work of breath, wheezing which has been progressively getting worse.  Patient was hospitalized for further management of COPD exacerbation.  Consultants: None  Procedures: None    Subjective/Interval History: Overnight events noted.  Patient developed hypercapnia during the night when he removed his BiPAP.  This was reapplied.  BiPAP taken off this morning.  Patient is awake talking.  Feels better.  He promises that he will be compliant with BiPAP at night.     Assessment/Plan:  COPD with acute exacerbation/acute respiratory failure with hypoxia and hypercapnia Will need BiPAP at nighttime due to hypercapnia.  This will need to be arranged at home as well.  We will request TOC to facilitate this. Patient promises to be compliant with his BiPAP at home. COVID-19 flu and RSV PCR negative. Respiratory status has improved.  He still wheezing though. Continue with Solu-Medrol  and nebulizer treatment.  He is on budesonide  nebulizer as well.  Continue doxycycline . Chest x-ray did not show any acute findings. Will probably need home oxygen  as well.  Coronary artery disease status post CABG Cardiac status is stable.  Continue aspirin  Plavix .  He is noted to be on metoprolol .  Noted to be bradycardic.  Will stop the metoprolol .  Continue statin.   Hyperkalemia Reason for this is not entirely clear.  Improved after he was given Lokelma .  Could be related to steroids.  Will give additional dose of Lokelma  today.    Chronic thrombocytopenia Stable.  Tobacco use disorder Counseled.  Class II obesity Estimated body mass  index is 38.45 kg/m as calculated from the following:   Height as of this encounter: 5' 9 (1.753 m).   Weight as of this encounter: 118.1 kg.  DVT Prophylaxis: Lovenox  Code Status: Full code Family Communication: Discussed with the patient Disposition Plan: Hopefully home when improved.  PT and OT eval.  Mobilize.  Out of bed to chair.     Medications: Scheduled:  aspirin  EC  81 mg Oral Daily   budesonide  (PULMICORT ) nebulizer solution  0.25 mg Nebulization BID   Chlorhexidine  Gluconate Cloth  6 each Topical Daily   clopidogrel   75 mg Oral Daily   cyanocobalamin   1,000 mcg Oral Daily   doxycycline   100 mg Oral Q12H   enoxaparin  (LOVENOX ) injection  60 mg Subcutaneous Q24H   guaiFENesin   1,200 mg Oral BID   ipratropium-albuterol   3 mL Nebulization TID   ketoconazole    Topical BID   levothyroxine   150 mcg Oral QAC breakfast   methylPREDNISolone  (SOLU-MEDROL ) injection  40 mg Intravenous Q12H   nicotine   14 mg Transdermal Daily   nystatin    Topical TID   mouth rinse  15 mL Mouth Rinse 4 times per day   rosuvastatin   40 mg Oral Daily   sertraline   50 mg Oral Daily   Continuous: PRN:albuterol , melatonin, nicotine  polacrilex, mouth rinse, pneumococcal 20-valent conjugate vaccine, sodium chloride   Antibiotics: Anti-infectives (From admission, onward)    Start     Dose/Rate Route Frequency Ordered Stop   11/27/23 1000  doxycycline  (VIBRA -TABS) tablet 100 mg        100 mg Oral Every 12 hours 11/27/23 0859         Objective:  Vital Signs  Vitals:   11/28/23 0650 11/28/23 0724 11/28/23 0729 11/28/23 0840  BP:  113/67    Pulse: 60 (!) 54    Resp: 15 19    Temp:    98.3 F (36.8 C)  TempSrc:    Oral  SpO2: 95% 92% 94%   Weight:      Height:        Intake/Output Summary (Last 24 hours) at 11/28/2023 0851 Last data filed at 11/28/2023 0630 Gross per 24 hour  Intake 387 ml  Output 1150 ml  Net -763 ml   Filed Weights   11/25/23 1936 11/25/23 2209 11/26/23 1615   Weight: 116.1 kg 116.1 kg 118.1 kg    General appearance: Awake alert.  In no distress Resp: Wheezing bilaterally.  Less so compared to yesterday.  Few crackles at the bases.  No rhonchi. Cardio: S1-S2 is normal regular.  No S3-S4.  No rubs murmurs or bruit GI: Abdomen is soft.  Nontender nondistended.  Bowel sounds are present normal.  No masses organomegaly Extremities: No edema.  Full range of motion of lower extremities. Neurologic: Alert and oriented x3.  No focal neurological deficits.    Lab Results:  Data Reviewed: I have personally reviewed following labs and reports of the imaging studies  CBC: Recent Labs  Lab 11/25/23 1947 11/26/23 1626 11/27/23 0305 11/28/23 0313  WBC 5.6 8.5 12.9* 12.4*  HGB 14.7 15.1 14.3 14.0  HCT 46.7 48.1 44.5 44.4  MCV 101.3* 103.4* 103.5* 106.0*  PLT 129* 133* 141* 139*    Basic Metabolic Panel: Recent Labs  Lab 11/25/23 1947 11/26/23 0853 11/27/23 0305 11/27/23 1256 11/28/23 0313  NA 135 136 132*  --  135  K 4.5 5.0 5.6* 5.8* 5.1  CL 95* 95* 93*  --  93*  CO2 29 31 31   --  34*  GLUCOSE 100* 151* 171*  --  170*  BUN 16 19 23   --  30*  CREATININE 0.85 0.87 0.80  --  0.79  CALCIUM  9.4 9.5 9.4  --  9.0  MG  --  2.7*  --   --   --     GFR: Estimated Creatinine Clearance: 90.3 mL/min (by C-G formula based on SCr of 0.79 mg/dL).  Liver Function Tests: Recent Labs  Lab 11/26/23 0853  AST 28  ALT 22  ALKPHOS 45  BILITOT 0.4  PROT 7.4  ALBUMIN  4.3     Recent Results (from the past 240 hours)  Resp panel by RT-PCR (RSV, Flu A&B, Covid) Anterior Nasal Swab     Status: None   Collection Time: 11/26/23 12:30 PM   Specimen: Anterior Nasal Swab  Result Value Ref Range Status   SARS Coronavirus 2 by RT PCR NEGATIVE NEGATIVE Final    Comment: (NOTE) SARS-CoV-2 target nucleic acids are NOT DETECTED.  The SARS-CoV-2 RNA is generally detectable in upper respiratory specimens during the acute phase of infection. The  lowest concentration of SARS-CoV-2 viral copies this assay can detect is 138 copies/mL. A negative result does not preclude SARS-Cov-2 infection and should not be used as the sole basis for treatment or other patient management decisions. A negative result may occur with  improper specimen collection/handling, submission of specimen other than nasopharyngeal swab, presence of viral mutation(s) within the areas targeted by this assay, and inadequate number of viral copies(<138 copies/mL). A negative result must be combined with clinical observations, patient history, and epidemiological information. The expected result is Negative.  Fact Sheet  for Patients:  BloggerCourse.com  Fact Sheet for Healthcare Providers:  SeriousBroker.it  This test is no t yet approved or cleared by the [place]  FDA and  has been authorized for detection and/or diagnosis of SARS-CoV-2 by FDA under an Emergency Use Authorization (EUA). This EUA will remain  in effect (meaning this test can be used) for the duration of the COVID-19 declaration under Section 564(b)(1) of the Act, 21 U.S.C.section 360bbb-3(b)(1), unless the authorization is terminated  or revoked sooner.       Influenza A by PCR NEGATIVE NEGATIVE Final   Influenza B by PCR NEGATIVE NEGATIVE Final    Comment: (NOTE) The Xpert Xpress SARS-CoV-2/FLU/RSV plus assay is intended as an aid in the diagnosis of influenza from Nasopharyngeal swab specimens and should not be used as a sole basis for treatment. Nasal washings and aspirates are unacceptable for Xpert Xpress SARS-CoV-2/FLU/RSV testing.  Fact Sheet for Patients: BloggerCourse.com  Fact Sheet for Healthcare Providers: SeriousBroker.it  This test is not yet approved or cleared by the [place]  FDA and has been authorized for detection and/or diagnosis of SARS-CoV-2 by FDA under  an Emergency Use Authorization (EUA). This EUA will remain in effect (meaning this test can be used) for the duration of the COVID-19 declaration under Section 564(b)(1) of the Act, 21 U.S.C. section 360bbb-3(b)(1), unless the authorization is terminated or revoked.     Resp Syncytial Virus by PCR NEGATIVE NEGATIVE Final    Comment: (NOTE) Fact Sheet for Patients: BloggerCourse.com  Fact Sheet for Healthcare Providers: SeriousBroker.it  This test is not yet approved or cleared by the [place]  FDA and has been authorized for detection and/or diagnosis of SARS-CoV-2 by FDA under an Emergency Use Authorization (EUA). This EUA will remain in effect (meaning this test can be used) for the duration of the COVID-19 declaration under Section 564(b)(1) of the Act, 21 U.S.C. section 360bbb-3(b)(1), unless the authorization is terminated or revoked.  Performed at [facility], [address], [place], [place] [address]   MRSA Next Gen by PCR, Nasal     Status: None   Collection Time: 11/26/23  5:01 PM   Specimen: Nasal Mucosa; Nasal Swab  Result Value Ref Range Status   MRSA by PCR Next Gen NOT DETECTED NOT DETECTED Final    Comment: (NOTE) The GeneXpert MRSA Assay (FDA approved for NASAL specimens only), is one component of a comprehensive MRSA colonization surveillance program. It is not intended to diagnose MRSA infection nor to guide or monitor treatment for MRSA infections. Test performance is not FDA approved in patients less than 4 years old. Performed at [facility], [address] [place], [place] [address]       Radiology Studies: No results found.    LOS: 2 days   Laney Bagshaw Verdene  [facility] Pager on [email or web address]  11/28/2023, 8:51 AM

## 2023-11-28 NOTE — Progress Notes (Signed)
   11/28/23 0359  BiPAP/CPAP/SIPAP  BiPAP/CPAP/SIPAP Pt Type Adult  BiPAP/CPAP/SIPAP V60  Mask Type Full face mask  Mask Size Large  Set Rate 14 breaths/min  Respiratory Rate 23 breaths/min  IPAP 18 cmH20  EPAP 8 cmH2O  FiO2 (%) 50 %  Minute Ventilation 9.8  Leak 27  Peak Inspiratory Pressure (PIP) 18  Tidal Volume (Vt) 423  Patient Home Machine No  Patient Home Mask No  Patient Home Tubing No  Auto Titrate No  Press High Alarm 35 cmH2O  Press Low Alarm 5 cmH2O  Device Plugged into RED Power Outlet Yes  BiPAP/CPAP /SiPAP Vitals  Pulse Rate 62  Resp 15  SpO2 93 %  MEWS Score/Color  MEWS Score 0  MEWS Score Color Jordan Hebert

## 2023-11-28 NOTE — TOC Initial Note (Signed)
 Transition of Care [facility]) - Initial/Assessment Note    Patient Details  Name: Jordan Hebert MRN: [id number] Date of Birth: 1940/11/23  [facility]) CM/SW Contact:    Heather DELENA Saltness, LCSW Phone Number: 11/28/2023, 3:29 PM  Clinical Narrative:                 CSW spoke with pt's daughter, Lanard Arguijo [phone], via phone call to discuss discharge planning. Pt lives at home with spouse and daughter. Daughter reports pt received HH PT services through [facility]. Daughter inquired about adding [facility] RN services. CSW sent message to Artavia with [facility]. Pt not currently on home oxygen , may need BiPAP at discharge. TOC will continue to follow.    Expected Discharge Plan: Home w Home Health Services Barriers to Discharge: Continued Medical Work up   Patient Goals and CMS Choice Patient states their goals for this hospitalization and ongoing recovery are:: To return home    Expected Discharge Plan and Services In-house Referral: Clinical Social Work Discharge Planning Services: NA Post Acute Care Choice: Home Health Living arrangements for the past 2 months: Single Family Home                 DME Arranged: N/A DME Agency: NA       HH Arranged: PT, RN HH Agency: [facility] Date HH Agency Contacted: 11/28/23 Time HH Agency Contacted: 1526 Representative spoke with at [facility] Agency: [facility]  Prior Living Arrangements/Services Living arrangements for the past 2 months: Single Family Home Lives with:: Self, Adult Children, Spouse Patient language and need for interpreter reviewed:: Yes Do you feel safe going back to the place where you live?: Yes      Need for Family Participation in Patient Care: Yes (Comment) Care giver support system in place?: Yes (comment) Current home services: Home OT, Home PT Criminal Activity/Legal Involvement Pertinent to Current Situation/Hospitalization: No - Comment as needed  Activities of Daily Living   ADL  Screening (condition at time of admission) Independently performs ADLs?: Yes (appropriate for developmental age) Is the patient deaf or have difficulty hearing?: No Does the patient have difficulty seeing, even when wearing glasses/contacts?: No Does the patient have difficulty concentrating, remembering, or making decisions?: No  Permission Sought/Granted Permission sought to share information with : Case Manager, Family Supports Permission granted to share information with : Yes, Verbal Permission Granted  Share Information with NAME: Aengus Sauceda  Permission granted to share info w AGENCY: [facility]  Permission granted to share info w Relationship: Daughter  Permission granted to share info w Contact Information: [phone]  Emotional Assessment Appearance:: Appears stated age Attitude/Demeanor/Rapport: Unable to Assess Affect (typically observed): Unable to Assess Orientation: : Oriented to Self, Oriented to Place, Oriented to  Time, Oriented to Situation Alcohol / Substance Use: Not Applicable Psych Involvement: No (comment)  Admission diagnosis:  COPD exacerbation (HCC) [J44.1] COPD with acute exacerbation (HCC) [J44.1] Patient Active Problem List   Diagnosis Date Noted   COPD with acute exacerbation (HCC) 11/26/2023   Atherosclerotic heart disease of native coronary artery without angina pectoris 10/02/2021   Chronic gouty arthritis 10/02/2021   DDD (degenerative disc disease), cervical 10/02/2021   Erectile dysfunction due to arterial insufficiency 10/02/2021   Gynecomastia 10/02/2021   Hypothyroidism 10/02/2021   Idiopathic gout 10/02/2021   Impaired fasting glucose 10/02/2021   Pure hypercholesterolemia 10/02/2021   Seborrheic dermatitis 10/02/2021   Chronic eczematous otitis externa of both ears 03/16/2018   Skipped heart  beats 11/16/2017   Hyperlipidemia 09/13/2017   Tobacco abuse 09/13/2017   S/P AVR    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   Aortic valve  stenosis    NSTEMI (non-ST elevated myocardial infarction) (HCC) 08/15/2017   Hereditary and idiopathic peripheral neuropathy 06/22/2013   PCP:  Stacia Millman, PA Pharmacy:   [id number] - [place], [place] - [address] AT [facility] OF [address] & [address] [place] [address] Phone: [phone] Fax: [phone]     Social Drivers of Health (SDOH) Social History: SDOH Screenings   Food Insecurity: No Food Insecurity (11/26/2023)  Housing: High Risk (11/26/2023)  Transportation Needs: No Transportation Needs (11/26/2023)  Utilities: At Risk (11/26/2023)  Physical Activity: Inactive (11/10/2017)  Social Connections: Moderately Integrated (11/26/2023)  Stress: Stress Concern Present (11/10/2017)  Tobacco Use: Medium Risk (11/25/2023)   SDOH Interventions: None indicated     Readmission Risk Interventions    11/28/2023    3:24 PM  Readmission Risk Prevention Plan  Post Dischage Appt Complete  Medication Screening Complete  Transportation Screening Complete    Signed: Heather Saltness, MSW, LCSW Clinical Social Worker Inpatient Care Management 11/28/2023 3:31 PM

## 2023-11-29 DIAGNOSIS — J9601 Acute respiratory failure with hypoxia: Secondary | ICD-10-CM | POA: Diagnosis not present

## 2023-11-29 DIAGNOSIS — J9602 Acute respiratory failure with hypercapnia: Secondary | ICD-10-CM | POA: Diagnosis not present

## 2023-11-29 DIAGNOSIS — J441 Chronic obstructive pulmonary disease with (acute) exacerbation: Secondary | ICD-10-CM | POA: Diagnosis not present

## 2023-11-29 LAB — GLUCOSE, CAPILLARY
Glucose-Capillary: 206 mg/dL — ABNORMAL HIGH (ref 70–99)
Glucose-Capillary: 281 mg/dL — ABNORMAL HIGH (ref 70–99)
Glucose-Capillary: 299 mg/dL — ABNORMAL HIGH (ref 70–99)
Glucose-Capillary: 239 mg/dL — ABNORMAL HIGH (ref 70–99)

## 2023-11-29 LAB — BASIC METABOLIC PANEL WITH GFR
Anion gap: 9 (ref 5–15)
BUN: 30 mg/dL — ABNORMAL HIGH (ref 8–23)
CO2: 33 mmol/L — ABNORMAL HIGH (ref 22–32)
Calcium: 9.2 mg/dL (ref 8.9–10.3)
Chloride: 92 mmol/L — ABNORMAL LOW (ref 98–111)
Creatinine, Ser: 0.72 mg/dL (ref 0.61–1.24)
GFR, Estimated: >60 mL/min (ref >60)
Glucose, Bld: 274 mg/dL — ABNORMAL HIGH (ref 70–99)
Potassium: 5 mmol/L (ref 3.5–5.1)
Sodium: 134 mmol/L — ABNORMAL LOW (ref 135–145)

## 2023-11-29 LAB — BLOOD GAS, ARTERIAL
Acid-Base Excess: 14.1 mmol/L — ABNORMAL HIGH (ref 0.0–2.0)
Bicarbonate: 43.6 mmol/L — ABNORMAL HIGH (ref 20.0–28.0)
Delivery systems: POSITIVE
Drawn by: 56037
Expiratory PAP: 6 cmH2O
FIO2: 40 %
Inspiratory PAP: 20 cmH2O
O2 Saturation: 97.8 %
Patient temperature: 36
RATE: 18 {breaths}/min
pCO2 arterial: 76 mmHg (ref 32–48)
pH, Arterial: 7.36 (ref 7.35–7.45)
pO2, Arterial: 83 mmHg (ref 83–108)

## 2023-11-29 MED ORDER — INSULIN GLARGINE 100 UNIT/ML ~~LOC~~ SOLN
10.0000 [IU] | Freq: Every day | SUBCUTANEOUS | Status: DC
  Administered 2023-11-29 – 2023-12-07 (×9): 10 [IU] via SUBCUTANEOUS
  Filled 2023-11-29 (×9): qty 0.1

## 2023-11-29 MED ORDER — INSULIN ASPART 100 UNIT/ML IJ SOLN
0.0000 [IU] | Freq: Three times a day (TID) | INTRAMUSCULAR | Status: DC
  Administered 2023-11-29: 5 [IU] via SUBCUTANEOUS
  Administered 2023-11-29: 2 [IU] via SUBCUTANEOUS
  Administered 2023-11-29: 8 [IU] via SUBCUTANEOUS
  Administered 2023-11-30: 5 [IU] via SUBCUTANEOUS
  Administered 2023-11-30: 3 [IU] via SUBCUTANEOUS
  Administered 2023-11-30: 5 [IU] via SUBCUTANEOUS
  Administered 2023-12-01: 3 [IU] via SUBCUTANEOUS
  Administered 2023-12-01: 2 [IU] via SUBCUTANEOUS
  Administered 2023-12-02 (×2): 5 [IU] via SUBCUTANEOUS
  Administered 2023-12-02 – 2023-12-03 (×2): 2 [IU] via SUBCUTANEOUS
  Administered 2023-12-03: 3 [IU] via SUBCUTANEOUS
  Administered 2023-12-03 – 2023-12-04 (×2): 8 [IU] via SUBCUTANEOUS
  Administered 2023-12-04: 2 [IU] via SUBCUTANEOUS
  Administered 2023-12-04: 5 [IU] via SUBCUTANEOUS
  Administered 2023-12-05 (×2): 3 [IU] via SUBCUTANEOUS
  Administered 2023-12-05: 5 [IU] via SUBCUTANEOUS
  Administered 2023-12-06: 3 [IU] via SUBCUTANEOUS
  Administered 2023-12-06: 2 [IU] via SUBCUTANEOUS
  Administered 2023-12-06: 5 [IU] via SUBCUTANEOUS
  Administered 2023-12-07: 2 [IU] via SUBCUTANEOUS

## 2023-11-29 MED ORDER — SODIUM ZIRCONIUM CYCLOSILICATE 10 G PO PACK
10.0000 g | PACK | Freq: Once | ORAL | Status: AC
Start: 2023-11-29 — End: 2023-11-29
  Administered 2023-11-29: 10 g via ORAL
  Filled 2023-11-29: qty 1

## 2023-11-29 MED ORDER — CHLORHEXIDINE GLUCONATE CLOTH 2 % EX PADS: 6.0000 | MEDICATED_PAD | Freq: Every day | CUTANEOUS | Status: DC

## 2023-11-29 MED ORDER — CHLORHEXIDINE GLUCONATE CLOTH 2 % EX PADS
6.0000 | MEDICATED_PAD | Freq: Every day | CUTANEOUS | Status: DC
  Administered 2023-11-29 – 2023-12-06 (×8): 6 via TOPICAL

## 2023-11-29 MED ORDER — PREDNISONE 20 MG PO TABS
60.0000 mg | ORAL_TABLET | Freq: Every day | ORAL | Status: DC
  Administered 2023-11-30 – 2023-12-07 (×8): 60 mg via ORAL
  Filled 2023-11-29 (×8): qty 3

## 2023-11-29 MED ORDER — INSULIN ASPART 100 UNIT/ML IJ SOLN
0.0000 [IU] | Freq: Every day | INTRAMUSCULAR | Status: DC
  Administered 2023-11-29 – 2023-11-30 (×2): 2 [IU] via SUBCUTANEOUS
  Administered 2023-12-02: 3 [IU] via SUBCUTANEOUS
  Administered 2023-12-03: 2 [IU] via SUBCUTANEOUS
  Administered 2023-12-04: 3 [IU] via SUBCUTANEOUS
  Administered 2023-12-05 – 2023-12-06 (×2): 2 [IU] via SUBCUTANEOUS

## 2023-11-29 NOTE — Inpatient Diabetes Management (Signed)
 Inpatient Diabetes Program Recommendations  AACE/ADA: New Consensus Statement on Inpatient Glycemic Control (2015)  Target Ranges:  Prepandial:   less than 140 mg/dL      Peak postprandial:   less than 180 mg/dL (1-2 hours)      Critically ill patients:  140 - 180 mg/dL   Lab Results  Component Value Date   GLUCAP 281 (H) 11/29/2023   HGBA1C 5.5 08/16/2017    Review of Glycemic Control  Diabetes history: IGT Outpatient Diabetes medications: None Current orders for Inpatient glycemic control: Novolog  0-15 TID with meals and 0-5 HS  On Solumedrol 40 Q12H  Inpatient Diabetes Program Recommendations:    Consider adding Novolog  2-3 units TID while on steroids.  If FBS > 180, consider adding Lantus  12 units every day  Awaiting updated HgbA1C results.  Continue to follow.  Thank you. Shona Brandy, RD, LDN, CDCES Inpatient Diabetes Coordinator [phone]

## 2023-11-29 NOTE — Progress Notes (Signed)
 Physical Therapy Treatment Patient Details Name: Jordan Hebert MRN: [id number] DOB: 27-Jun-1940 Today's Date: 11/29/2023   History of Present Illness Pt admitted from home2* SOB with COPD exacerbation.  Pt with hx of CAD, s/p CABG, aortic valve replacement, and spinal stenosis    PT Comments   The patient sleeping , arouses readily. [place] out of nares, SPO2 85% . Placed back on 4 L with Spo2 back to 96%.Patient ambulated with RW  and 4 L , Spo2 , post 84%. Encouraged Pursed lip breaths, stopped to catch breath x 4 while walking.  Patient  expresses limited knowledge of his deficits, discussed  with patient that he currently will require O2, stated,  I am getting a machine.   Patient's wife will be going to SNF for rehab, daughter is  unable to provide 24/7 assistance the patient will benefit fro  post acute rehab to gain strength, knowledge of caring for self with increased pulmonary needs and regain independence prior to returning home. Patient will benefit from continued inpatient follow up therapy, <3 hours/day     If plan is discharge home, recommend the following: A little help with walking and/or transfers;A little help with bathing/dressing/bathroom;Assistance with cooking/housework;Assist for transportation   Can travel by private vehicle     No  Equipment Recommendations  None recommended by PT    Recommendations for Other Services       Precautions / Restrictions Precautions Precautions: Fall Precaution/Restrictions Comments: monitor sats on 4 l Restrictions Weight Bearing Restrictions Per Provider Order: No     Mobility  Bed Mobility               General bed mobility comments: in recliner    Transfers Overall transfer level: Needs assistance Equipment used: Rolling walker (2 wheels) Transfers: Sit to/from Stand Sit to Stand: Min assist           General transfer comment: mod cues her hand placement,  2 attempts to rise from recliner     Ambulation/Gait Ambulation/Gait assistance: Min assist, +2 safety/equipment Gait Distance (Feet): 170 Feet Assistive device: Rolling walker (2 wheels)   Gait velocity: decr     General Gait Details: cues for posture and position from RW, stopped to rest x 4  Catch my breath>   Stairs             Wheelchair Mobility     Tilt Bed    Modified Rankin (Stroke Patients Only)       Balance Overall balance assessment: Needs assistance Sitting-balance support: No upper extremity supported, Feet supported Sitting balance-Leahy Scale: Good     Standing balance support: Bilateral upper extremity supported, During functional activity, Reliant on assistive device for balance Standing balance-Leahy Scale: Poor                              Communication Communication Communication: Impaired Factors Affecting Communication: Hearing impaired  Cognition Arousal: Alert Behavior During Therapy: WFL for tasks assessed/performed   PT - Cognitive impairments: Awareness                       PT - Cognition Comments: of deficits and  increased  pulmonary bnneds, just says they are getting me a machine.' patient will have  limited support at DC Following commands: Intact      Cueing Cueing Techniques: Verbal cues  Exercises      General Comments  Pertinent Vitals/Pain Pain Assessment Pain Assessment: No/denies pain    Home Living                          Prior Function            PT Goals (current goals can now be found in the care plan section) Progress towards PT goals: Progressing toward goals    Frequency    Min 2X/week      PT Plan      Co-evaluation              AM-PAC PT 6 Clicks Mobility   Outcome Measure  Help needed turning from your back to your side while in a flat bed without using bedrails?: A Little Help needed moving from lying on your back to sitting on the side of a flat bed without  using bedrails?: A Little Help needed moving to and from a bed to a chair (including a wheelchair)?: A Little Help needed standing up from a chair using your arms (e.g., wheelchair or bedside chair)?: A Little Help needed to walk in hospital room?: A Little Help needed climbing 3-5 steps with a railing? : A Lot 6 Click Score: 17    End of Session Equipment Utilized During Treatment: Gait belt;Oxygen  Activity Tolerance: Patient tolerated treatment well Patient left: in chair;with call bell/phone within reach;with chair alarm set Nurse Communication: Mobility status       Time: 8869-8845 PT Time Calculation (min) (ACUTE ONLY): 24 min  Charges:    $Gait Training: 23-37 mins PT General Charges $$ ACUTE PT VISIT: 1 Visit                     Darice Potters PT Acute Rehabilitation Services Office [phone]    Potters Darice Norris 11/29/2023, 12:44 PM

## 2023-11-29 NOTE — Progress Notes (Addendum)
 SATURATION QUALIFICATIONS: (This note is used to comply with regulatory documentation for home oxygen )  Patient Saturations on Room Air at Rest = 82%  Patient Saturations on Room Air while Ambulating = not tested  Patient Saturations on 6 Liters of oxygen  while Ambulating = 84%  Please briefly explain why patient needs home oxygen :

## 2023-11-29 NOTE — TOC Progression Note (Addendum)
 Transition of Care [facility]) - Progression Note    Patient Details  Name: Jordan Hebert MRN: [id number] Date of Birth: 09-06-1940  Transition of Care [facility]) CM/SW Contact  [name], RN Phone Number: 11/29/2023, 11:45 AM  Clinical Narrative:    NCM received consult for Home O2 and Bipap/NIV. Orders sent to [facility] with [facility]. Will submit orders, awaiting insurance auth for home Bipap/ NIV. IP Care Management continuing to follow.  Addendum: NCM received secure chat from PT stating pt recommendation changing to SNF. States pt will not have adequate support in the home upon discharge. NCM sent out referrals for SNF placement, awaiting bed offers. Will present offers to pt/ pt daughter once a list becomes available. Once bed choice is made, will start insurance auth. IP Care Management following.     Expected Discharge Plan: Home w Home Health Services Barriers to Discharge: Continued Medical Work up               Expected Discharge Plan and Services In-house Referral: Clinical Social Work Discharge Planning Services: NA Post Acute Care Choice: Home Health Living arrangements for the past 2 months: Single Family Home                 DME Arranged: N/A DME Agency: NA       HH Arranged: PT, RN HH Agency: [facility] Date HH Agency Contacted: 11/28/23 Time HH Agency Contacted: 1526 Representative spoke with at [place] Agency: [facility]   Social Drivers of Health (SDOH) Interventions SDOH Screenings   Food Insecurity: No Food Insecurity (11/26/2023)  Housing: High Risk (11/26/2023)  Transportation Needs: No Transportation Needs (11/26/2023)  Utilities: At Risk (11/26/2023)  Physical Activity: Inactive (11/10/2017)  Social Connections: Moderately Integrated (11/26/2023)  Stress: Stress Concern Present (11/10/2017)  Tobacco Use: Medium Risk (11/25/2023)    Readmission Risk Interventions    11/28/2023    3:24 PM  Readmission Risk Prevention Plan   Post Dischage Appt Complete  Medication Screening Complete  Transportation Screening Complete

## 2023-11-29 NOTE — NC FL2 (Signed)
 [place]  MEDICAID FL2 LEVEL OF CARE FORM     IDENTIFICATION  Patient Name: Jordan Hebert Birthdate: 06/14/1940 Sex: male Admission Date (Current Location): 11/25/2023  [facility] and [place] Number:  Producer, television/film/video and Address:  [facility],  [address], [place] [address]      Provider Number: [id number]  Attending Physician Name and Address:  Verdene Purchase, MD  Relative Name and Phone Number:  Waylan, Busta (Daughter)  [phone]    Current Level of Care: Hospital Recommended Level of Care: Skilled Nursing Facility Prior Approval Number:    Date Approved/Denied:   PASRR Number: [id number]  Discharge Plan: SNF    Current Diagnoses: Patient Active Problem List   Diagnosis Date Noted   COPD with acute exacerbation (HCC) 11/26/2023   Atherosclerotic heart disease of native coronary artery without angina pectoris 10/02/2021   Chronic gouty arthritis 10/02/2021   DDD (degenerative disc disease), cervical 10/02/2021   Erectile dysfunction due to arterial insufficiency 10/02/2021   Gynecomastia 10/02/2021   Hypothyroidism 10/02/2021   Idiopathic gout 10/02/2021   Impaired fasting glucose 10/02/2021   Pure hypercholesterolemia 10/02/2021   Seborrheic dermatitis 10/02/2021   Chronic eczematous otitis externa of both ears 03/16/2018   Skipped heart beats 11/16/2017   Hyperlipidemia 09/13/2017   Tobacco abuse 09/13/2017   S/P AVR    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   Aortic valve stenosis    NSTEMI (non-ST elevated myocardial infarction) (HCC) 08/15/2017   Hereditary and idiopathic peripheral neuropathy 06/22/2013    Orientation RESPIRATION BLADDER Height & Weight     Self, Situation, Place  O2 (4L) Incontinent Weight: 118.1 kg Height:  5' 9 (175.3 cm)  BEHAVIORAL SYMPTOMS/MOOD NEUROLOGICAL BOWEL NUTRITION STATUS      Incontinent Diet (Heart Healthy)  AMBULATORY STATUS COMMUNICATION OF NEEDS Skin   Limited Assist Verbally Normal                        Personal Care Assistance Level of Assistance  Bathing, Dressing, Feeding Bathing Assistance: Limited assistance Feeding assistance: Limited assistance Dressing Assistance: Limited assistance     Functional Limitations Info  Sight, Speech, Hearing Sight Info: Impaired Hearing Info: Impaired Speech Info: Adequate    SPECIAL CARE FACTORS FREQUENCY  PT (By licensed PT), OT (By licensed OT)     PT Frequency: 5x/wk OT Frequency: 5x/wk            Contractures Contractures Info: Not present    Additional Factors Info  Code Status, Allergies Code Status Info: FULL Allergies Info: Viagra (Sildenafil)           Current Medications (11/29/2023):  This is the current hospital active medication list Current Facility-Administered Medications  Medication Dose Route Frequency Provider Last Rate Last Admin   albuterol  (PROVENTIL ) (2.5 MG/3ML) 0.083% nebulizer solution 2.5 mg  2.5 mg Nebulization Q2H PRN Foust, Katy L, NP       aspirin  EC tablet 81 mg  81 mg Oral Daily Foust, Katy L, NP   81 mg at 11/29/23 0935   budesonide  (PULMICORT ) nebulizer solution 0.25 mg  0.25 mg Nebulization BID Foust, Katy L, NP   0.25 mg at 11/29/23 9175   Chlorhexidine  Gluconate Cloth 2 % PADS 6 each  6 each Topical Q2200 Krishnan, Gokul, MD       clopidogrel  (PLAVIX ) tablet 75 mg  75 mg Oral Daily Foust, Katy L, NP   75 mg at 11/29/23 0935   cyanocobalamin  (VITAMIN B12) tablet  1,000 mcg  1,000 mcg Oral Daily Krishnan, Gokul, MD   1,000 mcg at 11/29/23 0935   doxycycline  (VIBRA -TABS) tablet 100 mg  100 mg Oral Q12H Krishnan, Gokul, MD   100 mg at 11/29/23 0934   enoxaparin  (LOVENOX ) injection 60 mg  60 mg Subcutaneous Q24H Foust, Katy L, NP   60 mg at 11/29/23 0935   guaiFENesin  (MUCINEX ) 12 hr tablet 1,200 mg  1,200 mg Oral BID Foust, Katy L, NP   1,200 mg at 11/29/23 0934   insulin  aspart (novoLOG ) injection 0-15 Units  0-15 Units Subcutaneous TID WC Krishnan, Gokul, MD   8 Units at 11/29/23  1213   insulin  aspart (novoLOG ) injection 0-5 Units  0-5 Units Subcutaneous QHS Krishnan, Gokul, MD       ipratropium-albuterol  (DUONEB) 0.5-2.5 (3) MG/3ML nebulizer solution 3 mL  3 mL Nebulization TID Krishnan, Gokul, MD   3 mL at 11/29/23 9175   ketoconazole  (NIZORAL ) 2 % cream   Topical BID Foust, Katy L, NP   Given at 11/28/23 2212   levothyroxine  (SYNTHROID ) tablet 150 mcg  150 mcg Oral QAC breakfast Foust, Katy L, NP   150 mcg at 11/29/23 0758   melatonin tablet 5 mg  5 mg Oral QHS PRN Foust, Katy L, NP       methylPREDNISolone  sodium succinate (SOLU-MEDROL ) 40 mg/mL injection 40 mg  40 mg Intravenous Q12H Krishnan, Gokul, MD   40 mg at 11/29/23 0757   nicotine  (NICODERM CQ  - dosed in mg/24 hours) patch 14 mg  14 mg Transdermal Daily Foust, Katy L, NP   14 mg at 11/29/23 0935   nicotine  polacrilex (NICORETTE ) gum 2 mg  2 mg Oral PRN Foust, Katy L, NP       nystatin  (MYCOSTATIN /NYSTOP ) topical powder   Topical TID Krishnan, Gokul, MD   Given at 11/28/23 2211   Oral care mouth rinse  15 mL Mouth Rinse 4 times per day Verdene Purchase, MD   15 mL at 11/28/23 2212   Oral care mouth rinse  15 mL Mouth Rinse PRN Verdene Purchase, MD       pneumococcal 20-valent conjugate vaccine (PREVNAR 20) injection 0.5 mL  0.5 mL Intramuscular Prior to discharge Verdene Purchase, MD       rosuvastatin  (CRESTOR ) tablet 40 mg  40 mg Oral Daily Foust, Katy L, NP   40 mg at 11/29/23 0935   sertraline  (ZOLOFT ) tablet 50 mg  50 mg Oral Daily Foust, Katy L, NP   50 mg at 11/29/23 0934   sodium chloride  (OCEAN) 0.65 % nasal spray 1 spray  1 spray Each Nare PRN Krishnan, Gokul, MD   1 spray at 11/27/23 9671     Discharge Medications: Please see discharge summary for a list of discharge medications.  Relevant Imaging Results:  Relevant Lab Results:   Additional Information SSN: [id number]  [name], RN

## 2023-11-29 NOTE — Progress Notes (Addendum)
 [facility] PROGRESS NOTE   BURL TAUZIN FMW:[id number] DOB: 1940-11-28 DOA: 11/25/2023  PCP: Stacia Millman, PA  Brief History: 83 year old man PMH COPD, CAD s/p CABG, aortic valve stenosis, gout, hyperlipidemia, hypothyroidism, hypertension, chronic thrombocytopenia, hearing impairment, and tobacco use disorder presented to [facility] with reports of increased work of breath, wheezing which has been progressively getting worse.  Patient was hospitalized for further management of COPD exacerbation.  Consultants: None  Procedures: None    Subjective/Interval History: Patient taken off of BiPAP this morning.  Lying on the bed and looks comfortable.  Denies any shortness of breath or chest pain this morning.  Overall he is feeling better compared to 2 days ago.    Assessment/Plan:  COPD with acute exacerbation/acute on chronic respiratory failure with hypoxia and hypercapnia Will need BiPAP at nighttime due to hypercapnia.  Either BiPAP or trilogy ventilator will need to be arranged at home.  We will request TOC to facilitate this. Patient promises to be compliant with his BiPAP at home. COVID-19 flu and RSV PCR negative. Respiratory status has improved.  His wheezing has improved.  ABG from this morning shows improvement in hypercapnia and pH. Continue with Solu-Medrol  and nebulizer treatment.  He is on budesonide  nebulizer as well.  Continue doxycycline . Plan to de-escalate steroids from tomorrow if he continues to show improvement. Chest x-ray did not show any acute findings. Will need home oxygen  as well. Required 6lpm with ambulation. Was hypoxic on RA. Will also get CT and ECHO to complete the work up. Have sent amb referral to pulm for outpatient consult.  Patient requires noninvasive ventilation due to COPD consequent to chronic respiratory failure. OSA is not the cause of the patient's hypercapnia. Bilevel has been ruled out as insufficient due to lack of backup  batteries, lack of alarms, and lack of guaranteed pressure support. Patient has required ventilatory support with higher pressure support to lower his pCO2 levels as seen in ABG results. Without noninvasive ventilation, the patient will likely be readmitted to the hospital for an exacerbation, experience risk of harm or death.   Coronary artery disease status post CABG Cardiac status is stable.  Continue aspirin  Plavix .  He is noted to be on metoprolol .  Noted to be bradycardic.  Metoprolol  was discontinued.  Heart rate has improved.  Continue statin.   Hyperkalemia Reason for this is not entirely clear.  Improved after he was given Lokelma .  Could be related to steroids.  Potassium level stable.  Give dose of Lokelma  today.  Recheck labs in the morning.  Hyperglycemia Likely secondary to steroids. Check A1c. SSI. Add glargine but anticipate CBG's to improve as steroid is tapered off.  Chronic thrombocytopenia Stable.  Tobacco use disorder Counseled.  Class II obesity Estimated body mass index is 38.45 kg/m as calculated from the following:   Height as of this encounter: 5' 9 (1.753 m).   Weight as of this encounter: 118.1 kg.  DVT Prophylaxis: Lovenox  Code Status: Full code Family Communication: Discussed with the patient Disposition Plan: Home with home health when medically improved.  Hopefully discharge in 24 to 48 hours.  BiPAP or trilogy ventilator will need to be arranged at discharge.     Medications: Scheduled:  aspirin  EC  81 mg Oral Daily   budesonide  (PULMICORT ) nebulizer solution  0.25 mg Nebulization BID   Chlorhexidine  Gluconate Cloth  6 each Topical Daily   clopidogrel   75 mg Oral Daily   cyanocobalamin   1,000 mcg Oral Daily  doxycycline   100 mg Oral Q12H   enoxaparin  (LOVENOX ) injection  60 mg Subcutaneous Q24H   guaiFENesin   1,200 mg Oral BID   insulin  aspart  0-15 Units Subcutaneous TID WC   insulin  aspart  0-5 Units Subcutaneous QHS    ipratropium-albuterol   3 mL Nebulization TID   ketoconazole    Topical BID   levothyroxine   150 mcg Oral QAC breakfast   methylPREDNISolone  (SOLU-MEDROL ) injection  40 mg Intravenous Q12H   nicotine   14 mg Transdermal Daily   nystatin    Topical TID   mouth rinse  15 mL Mouth Rinse 4 times per day   rosuvastatin   40 mg Oral Daily   sertraline   50 mg Oral Daily   Continuous: PRN:albuterol , melatonin, nicotine  polacrilex, mouth rinse, pneumococcal 20-valent conjugate vaccine, sodium chloride   Antibiotics: Anti-infectives (From admission, onward)    Start     Dose/Rate Route Frequency Ordered Stop   11/27/23 1000  doxycycline  (VIBRA -TABS) tablet 100 mg        100 mg Oral Every 12 hours 11/27/23 0859         Objective:  Vital Signs  Vitals:   11/29/23 0400 11/29/23 0500 11/29/23 0600 11/29/23 0700  BP: 131/72 138/74 131/62 (!) 145/81  Pulse: 67 65 (!) 58 71  Resp: 11 12 (!) 21 19  Temp: (!) 96.8 F (36 C)     TempSrc: Axillary     SpO2: 91% 93% 92% 92%  Weight:      Height:        Intake/Output Summary (Last 24 hours) at 11/29/2023 0821 Last data filed at 11/29/2023 0630 Gross per 24 hour  Intake --  Output 1400 ml  Net -1400 ml   Filed Weights   11/25/23 1936 11/25/23 2209 11/26/23 1615  Weight: 116.1 kg 116.1 kg 118.1 kg    General appearance: Awake alert.  In no distress Resp: Normal effort at rest.  Improved aeration bilaterally.  Less wheezing today compared to yesterday.  Few crackles at the bases. Cardio: S1-S2 is normal regular.  No S3-S4.  No rubs murmurs or bruit GI: Abdomen is soft.  Nontender nondistended.  Bowel sounds are present normal.  No masses organomegaly Extremities: No edema.  Full range of motion of lower extremities. Neurologic: Alert and oriented x3.  No focal neurological deficits.    Lab Results:  Data Reviewed: I have personally reviewed following labs and reports of the imaging studies  CBC: Recent Labs  Lab 11/25/23 1947  11/26/23 1626 11/27/23 0305 11/28/23 0313  WBC 5.6 8.5 12.9* 12.4*  HGB 14.7 15.1 14.3 14.0  HCT 46.7 48.1 44.5 44.4  MCV 101.3* 103.4* 103.5* 106.0*  PLT 129* 133* 141* 139*    Basic Metabolic Panel: Recent Labs  Lab 11/25/23 1947 11/26/23 0853 11/27/23 0305 11/27/23 1256 11/28/23 0313 11/29/23 0303  NA 135 136 132*  --  135 134*  K 4.5 5.0 5.6* 5.8* 5.1 5.0  CL 95* 95* 93*  --  93* 92*  CO2 29 31 31   --  34* 33*  GLUCOSE 100* 151* 171*  --  170* 274*  BUN 16 19 23   --  30* 30*  CREATININE 0.85 0.87 0.80  --  0.79 0.72  CALCIUM  9.4 9.5 9.4  --  9.0 9.2  MG  --  2.7*  --   --   --   --     GFR: Estimated Creatinine Clearance: 90.3 mL/min (by C-G formula based on SCr of 0.72 mg/dL).  Liver  Function Tests: Recent Labs  Lab 11/26/23 0853  AST 28  ALT 22  ALKPHOS 45  BILITOT 0.4  PROT 7.4  ALBUMIN  4.3     Recent Results (from the past 240 hours)  Resp panel by RT-PCR (RSV, Flu A&B, Covid) Anterior Nasal Swab     Status: None   Collection Time: 11/26/23 12:30 PM   Specimen: Anterior Nasal Swab  Result Value Ref Range Status   SARS Coronavirus 2 by RT PCR NEGATIVE NEGATIVE Final    Comment: (NOTE) SARS-CoV-2 target nucleic acids are NOT DETECTED.  The SARS-CoV-2 RNA is generally detectable in upper respiratory specimens during the acute phase of infection. The lowest concentration of SARS-CoV-2 viral copies this assay can detect is 138 copies/mL. A negative result does not preclude SARS-Cov-2 infection and should not be used as the sole basis for treatment or other patient management decisions. A negative result may occur with  improper specimen collection/handling, submission of specimen other than nasopharyngeal swab, presence of viral mutation(s) within the areas targeted by this assay, and inadequate number of viral copies(<138 copies/mL). A negative result must be combined with clinical observations, patient history, and epidemiological information.  The expected result is Negative.  Fact Sheet for Patients:  BloggerCourse.com  Fact Sheet for Healthcare Providers:  SeriousBroker.it  This test is no t yet approved or cleared by the [place]  FDA and  has been authorized for detection and/or diagnosis of SARS-CoV-2 by FDA under an Emergency Use Authorization (EUA). This EUA will remain  in effect (meaning this test can be used) for the duration of the COVID-19 declaration under Section 564(b)(1) of the Act, 21 U.S.C.section 360bbb-3(b)(1), unless the authorization is terminated  or revoked sooner.       Influenza A by PCR NEGATIVE NEGATIVE Final   Influenza B by PCR NEGATIVE NEGATIVE Final    Comment: (NOTE) The Xpert Xpress SARS-CoV-2/FLU/RSV plus assay is intended as an aid in the diagnosis of influenza from Nasopharyngeal swab specimens and should not be used as a sole basis for treatment. Nasal washings and aspirates are unacceptable for Xpert Xpress SARS-CoV-2/FLU/RSV testing.  Fact Sheet for Patients: BloggerCourse.com  Fact Sheet for Healthcare Providers: SeriousBroker.it  This test is not yet approved or cleared by the [place]  FDA and has been authorized for detection and/or diagnosis of SARS-CoV-2 by FDA under an Emergency Use Authorization (EUA). This EUA will remain in effect (meaning this test can be used) for the duration of the COVID-19 declaration under Section 564(b)(1) of the Act, 21 U.S.C. section 360bbb-3(b)(1), unless the authorization is terminated or revoked.     Resp Syncytial Virus by PCR NEGATIVE NEGATIVE Final    Comment: (NOTE) Fact Sheet for Patients: BloggerCourse.com  Fact Sheet for Healthcare Providers: SeriousBroker.it  This test is not yet approved or cleared by the [place]  FDA and has been authorized for detection and/or  diagnosis of SARS-CoV-2 by FDA under an Emergency Use Authorization (EUA). This EUA will remain in effect (meaning this test can be used) for the duration of the COVID-19 declaration under Section 564(b)(1) of the Act, 21 U.S.C. section 360bbb-3(b)(1), unless the authorization is terminated or revoked.  Performed at [facility], [address] [place], [place] [address]   MRSA Next Gen by PCR, Nasal     Status: None   Collection Time: 11/26/23  5:01 PM   Specimen: Nasal Mucosa; Nasal Swab  Result Value Ref Range Status   MRSA by PCR Next Gen NOT DETECTED  NOT DETECTED Final    Comment: (NOTE) The GeneXpert MRSA Assay (FDA approved for NASAL specimens only), is one component of a comprehensive MRSA colonization surveillance program. It is not intended to diagnose MRSA infection nor to guide or monitor treatment for MRSA infections. Test performance is not FDA approved in patients less than 63 years old. Performed at [facility], [address], [place], [place] [address]       Radiology Studies: No results found.    LOS: 3 days   Charrie Mcconnon [employer] on [email or web address]  11/29/2023, 8:21 AM

## 2023-11-30 ENCOUNTER — Inpatient Hospital Stay (HOSPITAL_COMMUNITY)

## 2023-11-30 ENCOUNTER — Encounter (HOSPITAL_COMMUNITY): Payer: Self-pay | Admitting: Internal Medicine

## 2023-11-30 DIAGNOSIS — R0609 Other forms of dyspnea: Secondary | ICD-10-CM

## 2023-11-30 DIAGNOSIS — J441 Chronic obstructive pulmonary disease with (acute) exacerbation: Secondary | ICD-10-CM | POA: Diagnosis not present

## 2023-11-30 LAB — ECHOCARDIOGRAM COMPLETE
AR max vel: 1.39 cm2
AV Area VTI: 1.54 cm2
AV Area mean vel: 1.4 cm2
AV Mean grad: 8 mmHg
AV Peak grad: 16.2 mmHg
Ao pk vel: 2.02 m/s
Area-P 1/2: 3.77 cm2
Height: 69 in
MV M vel: 4.61 m/s
MV Peak grad: 85 mmHg
S' Lateral: 3.2 cm
Weight: 4165.81 [oz_av]

## 2023-11-30 LAB — HEMOGLOBIN A1C
Hgb A1c MFr Bld: 6.3 % — ABNORMAL HIGH (ref 4.8–5.6)
Mean Plasma Glucose: 134.11 mg/dL

## 2023-11-30 LAB — GLUCOSE, CAPILLARY
Glucose-Capillary: 163 mg/dL — ABNORMAL HIGH (ref 70–99)
Glucose-Capillary: 221 mg/dL — ABNORMAL HIGH (ref 70–99)
Glucose-Capillary: 220 mg/dL — ABNORMAL HIGH (ref 70–99)
Glucose-Capillary: 227 mg/dL — ABNORMAL HIGH (ref 70–99)

## 2023-11-30 LAB — BASIC METABOLIC PANEL WITH GFR
Anion gap: 9 (ref 5–15)
BUN: 31 mg/dL — ABNORMAL HIGH (ref 8–23)
CO2: 34 mmol/L — ABNORMAL HIGH (ref 22–32)
Calcium: 9.2 mg/dL (ref 8.9–10.3)
Chloride: 91 mmol/L — ABNORMAL LOW (ref 98–111)
Creatinine, Ser: 0.71 mg/dL (ref 0.61–1.24)
GFR, Estimated: >60 mL/min (ref >60)
Glucose, Bld: 190 mg/dL — ABNORMAL HIGH (ref 70–99)
Potassium: 5.3 mmol/L — ABNORMAL HIGH (ref 3.5–5.1)
Sodium: 134 mmol/L — ABNORMAL LOW (ref 135–145)

## 2023-11-30 MED ORDER — SODIUM CHLORIDE (PF) 0.9 % IJ SOLN
INTRAMUSCULAR | Status: AC
Start: 2023-11-30 — End: 2023-11-30
  Filled 2023-11-30: qty 50

## 2023-11-30 MED ORDER — SODIUM ZIRCONIUM CYCLOSILICATE 10 G PO PACK
10.0000 g | PACK | Freq: Two times a day (BID) | ORAL | Status: AC
  Administered 2023-11-30 (×2): 10 g via ORAL
  Filled 2023-11-30 (×2): qty 1

## 2023-11-30 MED ORDER — IOHEXOL 300 MG/ML  SOLN
75.0000 mL | Freq: Once | INTRAMUSCULAR | Status: AC | PRN
  Administered 2023-11-30: 75 mL via INTRAVENOUS

## 2023-11-30 NOTE — Progress Notes (Signed)
 Progress Note   Patient: Jordan Hebert DOB: 09-Jul-1940 DOA: 11/25/2023     4 DOS: the patient was seen and examined on 11/30/2023   Brief hospital course: 83 year old man PMH COPD, CAD s/p CABG, aortic valve stenosis, gout, hyperlipidemia, hypothyroidism, hypertension, chronic thrombocytopenia, hearing impairment, and tobacco use disorder presented to [facility] with reports of increased work of breath, wheezing which has been progressively getting worse. Patient was hospitalized for further management of COPD exacerbation.    Assessment and Plan: COPD with acute exacerbation/acute on chronic respiratory failure with hypoxia and hypercapnia Will need BiPAP at nighttime due to hypercapnia.  Either BiPAP or trilogy ventilator will need to be arranged on d/c.  Patient reportedly promised to be compliant with his BiPAP COVID-19 flu and RSV PCR negative. Respiratory status has improved.  His wheezing has improved.  ABG from this morning shows improvement in hypercapnia and pH. Continue with Solu-Medrol  and nebulizer treatment.  He is on budesonide  nebulizer as well.  Continue doxycycline . Plan to de-escalate steroids from tomorrow if he continues to show improvement. Chest x-ray did not show any acute findings. Will need home oxygen . Required 6lpm with ambulation. Was hypoxic on RA. CT chest pending 2d echo reviewed. Normal LVEF with mildly reduced RV function   Patient requires noninvasive ventilation due to COPD consequent to chronic respiratory failure. OSA is not the cause of the patient's hypercapnia. Bilevel has been ruled out as insufficient due to lack of backup batteries, lack of alarms, and lack of guaranteed pressure support. Patient has required ventilatory support with higher pressure support to lower his pCO2 levels as seen in ABG results. Without noninvasive ventilation, the patient will likely be readmitted to the hospital for an exacerbation, experience risk of harm or  death.    Coronary artery disease status post CABG Cardiac status is stable.  Continue aspirin  Plavix .  He is noted to be on metoprolol .  Recently noted to be bradycardic.  Metoprolol  was discontinued.  Heart rate has improved.  Continue statin.    Hyperkalemia Reason for this is not entirely clear.  Improved after he was given Lokelma .  Could be related to steroids.  Potassium level stable.  Give dose of Lokelma  today.  Recheck labs in the morning.   Hyperglycemia Likely secondary to steroids. Check A1c. SSI. Add glargine but anticipate CBG's to improve as steroid is tapered off.   Chronic thrombocytopenia Stable.   Tobacco use disorder Counseled.   Class II obesity Estimated body mass index is 38.45 kg/m as calculated from the following:   Height as of this encounter: 5' 9 (1.753 m).   Weight as of this encounter: 118.1 kg.       Subjective: Reports feeling better today. Denies sob  Physical Exam: Vitals:   11/30/23 1400 11/30/23 1459 11/30/23 1500 11/30/23 1600  BP: (!) 155/97 (!) 158/91 (!) 158/91 (!) 142/83  Pulse: 77  64 62  Resp: 13  14 13   Temp:    97.6 F (36.4 C)  TempSrc:    Axillary  SpO2: 96%  94% 94%  Weight:      Height:       General exam: Awake, laying in bed, in nad Respiratory system: Normal respiratory effort, end-expiratory wheeze Cardiovascular system: regular rate, s1, s2 Gastrointestinal system: Soft, nondistended, positive BS Central nervous system: CN2-12 grossly intact, strength intact Extremities: Perfused, no clubbing Skin: Normal skin turgor, no notable skin lesions seen Psychiatry: Mood normal // no visual hallucinations   Data Reviewed:  Labs reviewed: Na 134, K 5.3, Cr 0.71  Family Communication: Pt in room, family nota t bedside  Disposition: Status is: Inpatient Remains inpatient appropriate because: severity of illness  Planned Discharge Destination: Skilled nursing facility    Author: Garnette Pelt, MD 11/30/2023  4:27 PM  For on call review [email or web address].

## 2023-11-30 NOTE — Plan of Care (Signed)
  Problem: Clinical Measurements: Goal: Ability to maintain clinical measurements within normal limits will improve Outcome: Progressing   Problem: Clinical Measurements: Goal: Will remain free from infection Outcome: Progressing   Problem: Clinical Measurements: Goal: Diagnostic test results will improve Outcome: Progressing   Problem: Clinical Measurements: Goal: Respiratory complications will improve Outcome: Progressing   Problem: Activity: Goal: Risk for activity intolerance will decrease Outcome: Progressing   

## 2023-11-30 NOTE — Progress Notes (Signed)
 PT Cancellation Note  Patient Details Name: Jordan Hebert MRN: [id number] DOB: 06/15/1940   Cancelled Treatment:     hold off Therapy for today per RN.   Increased lethargy and placed on BiPap.   Pt has been evaluated with rec for SNF.   I did get to Tx his wife today.  She is not bed bound as documented.  She amb 42 feet with walker assist and plans to D/C to SNF for ST Rehab.  Katheryn Leap  PTA Acute  Rehabilitation Services Office M-F          [phone]

## 2023-11-30 NOTE — Progress Notes (Signed)
 Pt placed on bipap d/t increasing somnolence and becoming harder to wake. RRT aware.

## 2023-11-30 NOTE — Progress Notes (Signed)
 OT Cancellation Note  Patient Details Name: Jordan Hebert MRN: [id number] DOB: October 24, 1940   Cancelled Treatment:    Reason Eval/Treat Not Completed: Medical issues which prohibited therapy;Fatigue/lethargy limiting ability to participate Nursing reports patient required BiPAP and has had fatigue and limited arousal this afternoon. Will continue to follow as patient able and schedule allows.   Taiz Bickle OT/L Acute Rehabilitation Department  [phone]    11/30/2023, 3:46 PM

## 2023-11-30 NOTE — Progress Notes (Signed)
  Echocardiogram 2D Echocardiogram has been performed.  Aysha Livecchi G Jahira Swiss, RDCS 11/30/2023, 1:00 PM

## 2023-11-30 NOTE — Plan of Care (Signed)
  Problem: Education: Goal: Knowledge of General Education information will improve Description: Including pain rating scale, medication(s)/side effects and non-pharmacologic comfort measures Outcome: Progressing   Problem: Activity: Goal: Risk for activity intolerance will decrease Outcome: Progressing   Problem: Nutrition: Goal: Adequate nutrition will be maintained Outcome: Progressing   Problem: Education: Goal: Knowledge of disease or condition will improve Outcome: Progressing

## 2023-11-30 NOTE — Progress Notes (Signed)
   11/30/23 0001  BiPAP/CPAP/SIPAP  $ Non-Invasive Ventilator  Non-Invasive Vent Subsequent  BiPAP/CPAP/SIPAP Pt Type Adult  BiPAP/CPAP/SIPAP V60  Mask Type Full face mask  Dentures removed? Yes - Placed in denture cup  Mask Size Large  Set Rate 18 breaths/min  Respiratory Rate 22 breaths/min  IPAP 20 cmH20  EPAP 6 cmH2O  FiO2 (%) 40 %  Minute Ventilation 14.7  Leak 12  Peak Inspiratory Pressure (PIP) 20  Tidal Volume (Vt) 661  Patient Home Machine No  Patient Home Mask No  Patient Home Tubing No  Auto Titrate No  Press High Alarm 30 cmH2O  Press Low Alarm 5 cmH2O  Device Plugged into RED Power Outlet Yes  Oxygen  Percent 40 %  BiPAP/CPAP /SiPAP Vitals  Pulse Rate 70  Resp 20  BP (!) 176/104  SpO2 92 %  MEWS Score/Color  MEWS Score 0  MEWS Score Color Green

## 2023-12-01 DIAGNOSIS — J441 Chronic obstructive pulmonary disease with (acute) exacerbation: Secondary | ICD-10-CM | POA: Diagnosis not present

## 2023-12-01 LAB — CBC
HCT: 46.5 % (ref 39.0–52.0)
Hemoglobin: 14.7 g/dL (ref 13.0–17.0)
MCH: 32.2 pg (ref 26.0–34.0)
MCHC: 31.6 g/dL (ref 30.0–36.0)
MCV: 101.8 fL — ABNORMAL HIGH (ref 80.0–100.0)
Platelets: 115 K/uL — ABNORMAL LOW (ref 150–400)
RBC: 4.57 MIL/uL (ref 4.22–5.81)
RDW: 14 % (ref 11.5–15.5)
WBC: 9.5 K/uL (ref 4.0–10.5)
nRBC: 0 % (ref 0.0–0.2)

## 2023-12-01 LAB — GLUCOSE, CAPILLARY
Glucose-Capillary: 114 mg/dL — ABNORMAL HIGH (ref 70–99)
Glucose-Capillary: 145 mg/dL — ABNORMAL HIGH (ref 70–99)
Glucose-Capillary: 199 mg/dL — ABNORMAL HIGH (ref 70–99)
Glucose-Capillary: 153 mg/dL — ABNORMAL HIGH (ref 70–99)

## 2023-12-01 LAB — COMPREHENSIVE METABOLIC PANEL WITH GFR
ALT: 33 U/L (ref 0–44)
AST: 26 U/L (ref 15–41)
Albumin: 3.6 g/dL (ref 3.5–5.0)
Alkaline Phosphatase: 34 U/L — ABNORMAL LOW (ref 38–126)
Anion gap: 9 (ref 5–15)
BUN: 27 mg/dL — ABNORMAL HIGH (ref 8–23)
CO2: 33 mmol/L — ABNORMAL HIGH (ref 22–32)
Calcium: 9.1 mg/dL (ref 8.9–10.3)
Chloride: 89 mmol/L — ABNORMAL LOW (ref 98–111)
Creatinine, Ser: 0.68 mg/dL (ref 0.61–1.24)
GFR, Estimated: >60 mL/min (ref >60)
Glucose, Bld: 156 mg/dL — ABNORMAL HIGH (ref 70–99)
Potassium: 5.2 mmol/L — ABNORMAL HIGH (ref 3.5–5.1)
Sodium: 131 mmol/L — ABNORMAL LOW (ref 135–145)
Total Bilirubin: 0.5 mg/dL (ref 0.0–1.2)
Total Protein: 6.3 g/dL — ABNORMAL LOW (ref 6.5–8.1)

## 2023-12-01 MED ORDER — RIVAROXABAN 10 MG PO TABS
10.0000 mg | ORAL_TABLET | Freq: Every day | ORAL | Status: DC
  Administered 2023-12-02 – 2023-12-07 (×6): 10 mg via ORAL
  Filled 2023-12-01 (×6): qty 1

## 2023-12-01 MED ORDER — SODIUM ZIRCONIUM CYCLOSILICATE 10 G PO PACK
10.0000 g | PACK | Freq: Two times a day (BID) | ORAL | Status: AC
  Administered 2023-12-01 (×2): 10 g via ORAL
  Filled 2023-12-01 (×2): qty 1

## 2023-12-01 MED ORDER — ENSURE MAX PROTEIN PO LIQD
11.0000 [oz_av] | Freq: Every day | ORAL | Status: DC
  Administered 2023-12-01 – 2023-12-07 (×5): 11 [oz_av] via ORAL
  Filled 2023-12-01 (×9): qty 330

## 2023-12-01 NOTE — TOC Progression Note (Addendum)
 Transition of Care [facility]) - Progression Note    Patient Details  Name: Jordan Hebert MRN: [id number] Date of Birth: 1940-12-29  Transition of Care [facility]) CM/SW Contact  [name], RN Phone Number: 12/01/2023, 2:11 PM  Clinical Narrative:    NCM met with pt at bedside to discuss current bed offers. Pt states that his wife will be discharging to a SNF and would like to go where she is going. NCM sent message to [facility] at [place] and awaiting a response.   NCM spoke with pt daughter [name] and she states she would prefer if pt is together with spouse at the same facility. Awaiting a response from pt daughter about bed choice. IP Care Management continuing to follow.  Addendum:  NCM spoke with [name] at [facility] and she can offer pt a bed, although bed will not be available until Monday 9/8. Will start insurance auth tomorrow and wait for approval.   The following bed offers were presented to the patient: [facility] Preferred SNF  [address] [place] [address] [phone]      1 star  [facility] Preferred SNF  [address], [place] [address] [phone]      5 stars  [facility] Preferred SNF  [address] [place] [address] [phone]      2 stars  [address] [address] [place] Preferred SNF  [address], [place] [address] [phone]      4 stars  [facility]  [address], [place] [address] [phone]      2 stars  [address] SNF  [name] [place], [place] [address] [phone]      2 stars  [facility]  [address] [place] [address] [phone]      1 star  [facility] Preferred SNF  [address], [place] [address] [phone]      1 star    Expected Discharge Plan: Skilled Nursing  Facility Barriers to Discharge: Continued Medical Work up               Expected Discharge Plan and Services In-house Referral: Clinical Social Work Discharge Planning Services: NA Post Acute Care Choice: Home Health Living arrangements for the past 2 months: Single Family Home                 DME Arranged: N/A DME Agency: NA       HH Arranged: PT, RN HH Agency: [facility] Date HH Agency Contacted: 11/28/23 Time HH Agency Contacted: 1526 Representative spoke with at [facility] Agency: [facility]   Social Drivers of Health (SDOH) Interventions SDOH Screenings   Food Insecurity: No Food Insecurity (11/26/2023)  Housing: High Risk (11/26/2023)  Transportation Needs: No Transportation Needs (11/26/2023)  Utilities: At Risk (11/26/2023)  Physical Activity: Inactive (11/10/2017)  Social Connections: Moderately Integrated (11/26/2023)  Stress: Stress Concern Present (11/10/2017)  Tobacco Use: Medium Risk (11/25/2023)    Readmission Risk Interventions    11/28/2023    3:24 PM  Readmission Risk Prevention Plan  Post Dischage Appt Complete  Medication Screening Complete  Transportation Screening Complete

## 2023-12-01 NOTE — Progress Notes (Signed)
 Nutrition Follow-up  DOCUMENTATION CODES:   Obesity unspecified  INTERVENTION:  - Heart healthy diet per MD.  - Ensure Max po daily, provides 150 kcal and 30 grams of protein.  - Encourage intake at all meals.     NUTRITION DIAGNOSIS:   Increased nutrient needs related to acute illness as evidenced by estimated needs. *ongoing  GOAL:   Patient will meet greater than or equal to 90% of their needs *progressing  MONITOR:   PO intake, Weight trends  REASON FOR ASSESSMENT:   Consult Assessment of nutrition requirement/status  ASSESSMENT:   83 year old male with PMH COPD, CAD s/p CABG, aortic valve stenosis, gout, HLD, HTN, hypothyroidism, hearing impairment, and tobacco use disorder who presented with increased work of breath, wheezing which has been progressively getting worse. Admitted for COPD exacerbation.  Patient in bed at time of visit. Daughter at bedside and provided most nutrition history.   UBW reported to be 256# and patient feels his weight has been stable. He usually snacks throughout the day at home and has a really good appetite at baseline. Eating normally up until admission.   Patient/daughter report he has been eating fairly well since admission. No PO intakes documented to assess oral intake.  Daughter reports the patient has top dentures and only a few bottom teeth which make it difficult for him to chew sometimes. Thankfully, patient reports he has been tolerating food well so far this admission.   Daughter shares the patient's PCP wants to put him on [facility] for weight loss and as HA1C now noted to be 6.3. Daughter inquiring about tips for eating healthy after discharge.  Provided handouts on lean vegetables and protein rich food sources and emphasized intake of these foods. Also reviewed general healthy eating tips for once discharging. Daughter appreciative of information. For now, will add Ensure Max daily to support oral intake during admission.     Medications reviewed and include: vitamin B12  Labs reviewed:  Na 131 K+ 5.2 HA1C 6.3 Blood Glucose 114-227 x24 hours  NUTRITION - FOCUSED PHYSICAL EXAM:  No depletions  Diet Order:   Diet Order             Diet Heart Room service appropriate? Yes; Fluid consistency: Thin  Diet effective now                   EDUCATION NEEDS:  No education needs have been identified at this time  Skin:  Skin Assessment: Reviewed RN Assessment  Last BM:  9/1 - type 4  Height:  Ht Readings from Last 1 Encounters:  11/26/23 5' 9 (1.753 m)   Weight:  Wt Readings from Last 1 Encounters:  11/26/23 118.1 kg   Ideal Body Weight:  72.73 kg  BMI:  Body mass index is 38.45 kg/m.  Estimated Nutritional Needs:  Kcal:  2000-2350 kcals Protein:  100-120 grams Fluid:  >/= 2L   Trude Ned RD, LDN Contact via Secure Chat.

## 2023-12-01 NOTE — Progress Notes (Signed)
   12/01/23 2306  BiPAP/CPAP/SIPAP  BiPAP/CPAP/SIPAP Pt Type Adult  BiPAP/CPAP/SIPAP V60  Mask Type Full face mask  Dentures removed? Not applicable  Mask Size Large  Set Rate 18 breaths/min  Respiratory Rate 18 breaths/min  IPAP 18 cmH20  EPAP 8 cmH2O  FiO2 (%) 40 %  Minute Ventilation 13.2  Leak 17  Peak Inspiratory Pressure (PIP) 18  Tidal Volume (Vt) 735  Patient Home Machine No  Patient Home Mask No  Patient Home Tubing No  Auto Titrate No  Press High Alarm 30 cmH2O  Press Low Alarm 5 cmH2O  CPAP/SIPAP surface wiped down Yes  Device Plugged into RED Power Outlet Yes  Oxygen  Percent 40 %  BiPAP/CPAP /SiPAP Vitals  Pulse Rate 63  Resp 18  Bilateral Breath Sounds Diminished;Rhonchi  MEWS Score/Color  MEWS Score 0  MEWS Score Color Green

## 2023-12-01 NOTE — Progress Notes (Signed)
 Occupational Therapy Treatment Patient Details Name: LAWARENCE MEEK MRN: [id number] DOB: 07-31-1940 Today's Date: 12/01/2023   History of present illness Pt admitted from home2* SOB with COPD exacerbation.  Pt with hx of CAD, s/p CABG, aortic valve replacement, and spinal stenosis   OT comments  Patient seen for skilled OT treatment this afternoon. Patient open to all therapy presented. Respiratory just completing breathing tx and encouraging patient to cough and use IS/Flutter. OT assisted with cues and reminders for use, ankle pumps as well as issued foam grasp ball for 10 reps q 1 hr. Stood for buttocks hygiene and STS strength and balance training with RW. Patient requires continued Acute care hospital level OT services to progress safety and functional performance and allow for discharge. Based on patients current status and limitations with family support as wife now in hospital for rehab, patient will benefit from continued inpatient follow up therapy, <3 hours/day.         If plan is discharge home, recommend the following:  A lot of help with walking and/or transfers;A lot of help with bathing/dressing/bathroom;Assistance with cooking/housework;Direct supervision/assist for medications management;Direct supervision/assist for financial management;Assist for transportation;Help with stairs or ramp for entrance   Equipment Recommendations  Tub/shower bench       Precautions / Restrictions Precautions Precautions: Fall Precaution/Restrictions Comments: monitor sats on 5 l Restrictions Weight Bearing Restrictions Per Provider Order: No       Mobility Bed Mobility Overal bed mobility:  (pt up in recliner start of session and remained post tx)                  Transfers Overall transfer level: Needs assistance Equipment used: Rolling walker (2 wheels) Transfers: Sit to/from Stand Sit to Stand: Min assist           General transfer comment: STS x 4 with marching  and stand tolerance for ADL's with min cues for hand placement and breathing integ     Balance Overall balance assessment: Needs assistance Sitting-balance support: No upper extremity supported, Feet supported Sitting balance-Leahy Scale: Good     Standing balance support: Bilateral upper extremity supported, During functional activity, Reliant on assistive device for balance Standing balance-Leahy Scale: Poor                             ADL either performed or assessed with clinical judgement   ADL Overall ADL's : Needs assistance/impaired     Grooming: Wash/dry hands;Wash/dry face;Oral care;Sitting;Set up       Lower Body Bathing: Moderate assistance;Maximal assistance;Sit to/from stand Lower Body Bathing Details (indicate cue type and reason): stood for buttocks hygiene with RW and unilateral support                     Functional mobility during ADLs: Minimal assistance;Rolling walker (2 wheels) General ADL Comments: standing tolerance progression with UE support and breathing/pacing education    Extremity/Trunk Assessment Upper Extremity Assessment Upper Extremity Assessment: Overall WFL for tasks assessed;Right hand dominant   Lower Extremity Assessment Lower Extremity Assessment: Defer to PT evaluation        Vision   Vision Assessment?: No apparent visual deficits;Wears glasses for reading;Wears glasses for driving   Perception     Praxis     Communication Communication Communication: Impaired Factors Affecting Communication: Hearing impaired   Cognition Arousal: Alert Behavior During Therapy: WFL for tasks assessed/performed Cognition: No apparent impairments  OT - Cognition Comments: mild increased processing time                 Following commands: Intact        Cueing   Cueing Techniques: Verbal cues  Exercises Exercises: Other exercises (ankle pumps, IS, Flutter and squeeze ball 10 reps q 1 hr)        General Comments SpO2 on 5 ltrs fluctuates 90-96% with cues needed for breathing and pacing    Pertinent Vitals/ Pain       Pain Assessment Pain Assessment: No/denies pain   Frequency  Min 2X/week        Progress Toward Goals  OT Goals(current goals can now be found in the care plan section)  Progress towards OT goals: Progressing toward goals  Acute Rehab OT Goals Patient Stated Goal: to be able to be with my wife OT Goal Formulation: With patient Time For Goal Achievement: 12/12/23 Potential to Achieve Goals: Fair ADL Goals Pt Will Perform Lower Body Bathing: with set-up;with adaptive equipment;sit to/from stand Pt Will Perform Lower Body Dressing: sit to/from stand;with adaptive equipment;with min assist Pt Will Transfer to Toilet: with contact guard assist;regular height toilet;grab bars Pt Will Perform Toileting - Clothing Manipulation and hygiene: with set-up;sit to/from stand Pt Will Perform Tub/Shower Transfer: with min assist;tub bench;rolling walker;Tub transfer Pt/caregiver will Perform Home Exercise Program: Both right and left upper extremity;With written HEP provided;Independently Additional ADL Goal #1: Patient will teach back 4/5 ECT principles with ADL's and mobility with min cues  Plan         AM-PAC OT 6 Clicks Daily Activity     Outcome Measure   Help from another person eating meals?: A Little Help from another person taking care of personal grooming?: A Little Help from another person toileting, which includes using toliet, bedpan, or urinal?: A Lot Help from another person bathing (including washing, rinsing, drying)?: A Lot Help from another person to put on and taking off regular upper body clothing?: A Little Help from another person to put on and taking off regular lower body clothing?: A Lot 6 Click Score: 15    End of Session Equipment Utilized During Treatment: Gait belt;Rolling walker (2 wheels);Oxygen   OT Visit Diagnosis:  Unsteadiness on feet (R26.81);Muscle weakness (generalized) (M62.81)   Activity Tolerance Patient tolerated treatment well   Patient Left in chair;with call bell/phone within reach;with chair alarm set   Nurse Communication Mobility status        Time: 8644-8576 OT Time Calculation (min): 28 min  Charges: OT General Charges $OT Visit: 1 Visit OT Treatments $Self Care/Home Management : 8-22 mins $Therapeutic Exercise: 8-22 mins  Euline Kimbler OT/L Acute Rehabilitation Department  [phone]  12/01/2023, 4:02 PM

## 2023-12-01 NOTE — Progress Notes (Signed)
 Progress Note   Patient: Jordan Hebert FMW:[id number] DOB: January 22, 1941 DOA: 11/25/2023     5 DOS: the patient was seen and examined on 12/01/2023   Brief hospital course: 83 year old man PMH COPD, CAD s/p CABG, aortic valve stenosis, gout, hyperlipidemia, hypothyroidism, hypertension, chronic thrombocytopenia, hearing impairment, and tobacco use disorder presented to [facility] with reports of increased work of breath, wheezing which has been progressively getting worse. Patient was hospitalized for further management of COPD exacerbation.    Assessment and Plan: COPD with acute exacerbation/acute on chronic respiratory failure with hypoxia and hypercapnia Will need BiPAP at nighttime due to hypercapnia.  Either BiPAP or trilogy ventilator will need to be arranged on d/c.  Patient reportedly promised to be compliant with his BiPAP COVID-19 flu and RSV PCR negative. Respiratory status has improved.  His wheezing has improved.  ABG from this morning shows improvement in hypercapnia and pH. Continue with Solu-Medrol  and nebulizer treatment.  He is on budesonide  nebulizer as well.  Continue doxycycline . Plan to de-escalate steroids over time Will need home oxygen . Required 6lpm with ambulation. Was hypoxic on RA. CT chest reviewed. Findings c/w mucus plugging. Afebrile, no leukocytosis, no significant cough. Cont mucinex  and routine flutter valve 2d echo reviewed. Normal LVEF with mildly reduced RV function Cont to wean O2 as tolerated   Patient requires noninvasive ventilation due to COPD consequent to chronic respiratory failure. OSA is not the cause of the patient's hypercapnia. Bilevel has been ruled out as insufficient due to lack of backup batteries, lack of alarms, and lack of guaranteed pressure support. Patient has required ventilatory support with higher pressure support to lower his pCO2 levels as seen in ABG results. Without noninvasive ventilation, the patient will likely be readmitted to the  hospital for an exacerbation, experience risk of harm or death.    Coronary artery disease status post CABG Cardiac status is stable.  Continue aspirin  Plavix .  He is noted to be on metoprolol .  Recently noted to be bradycardic.  Metoprolol  was discontinued.  Heart rate has improved.  Continue statin.    Hyperkalemia Reason for this is not entirely clear.  Improved after he was given Lokelma .  Could be related to steroids.  Potassium level stable.  Give dose of Lokelma  today.  Recheck labs in the morning.   Hyperglycemia Likely secondary to steroids. Check A1c. SSI. Cont glargine but anticipate CBG's to improve as steroid is tapered off.   Chronic thrombocytopenia Stable.   Tobacco use disorder Counseled.   Class II obesity Estimated body mass index is 38.45 kg/m as calculated from the following:   Height as of this encounter: 5' 9 (1.753 m).   Weight as of this encounter: 118.1 kg.       Subjective: States feeling better today.  Physical Exam: Vitals:   12/01/23 0906 12/01/23 1000 12/01/23 1100 12/01/23 1200  BP:  108/67 124/67 (!) 141/91  Pulse: 85 75 70 78  Resp:  13 10 17   Temp:      TempSrc:      SpO2: (!) 89% 90% 95% 91%  Weight:      Height:       General exam: Conversant, in no acute distress Respiratory system: normal chest rise, clear, no audible wheezing Cardiovascular system: regular rhythm, s1-s2 Gastrointestinal system: Nondistended, nontender, pos BS Central nervous system: No seizures, no tremors Extremities: No cyanosis, no joint deformities Skin: No rashes, no pallor Psychiatry: Affect normal // no auditory hallucinations   Data Reviewed:  Labs reviewed:  Na 131, K 5.2, Cr 0.68, WBC 9.5, Hgb 14.7, Plts 115  Family Communication: Pt in room, family not at bedside  Disposition: Status is: Inpatient Remains inpatient appropriate because: severity of illness  Planned Discharge Destination: Skilled nursing facility    Author: Garnette Pelt,  MD 12/01/2023 1:31 PM  For on call review [email or web address].

## 2023-12-02 DIAGNOSIS — J441 Chronic obstructive pulmonary disease with (acute) exacerbation: Secondary | ICD-10-CM | POA: Diagnosis not present

## 2023-12-02 LAB — COMPREHENSIVE METABOLIC PANEL WITH GFR
ALT: 34 U/L (ref 0–44)
AST: 17 U/L (ref 15–41)
Albumin: 3.7 g/dL (ref 3.5–5.0)
Alkaline Phosphatase: 31 U/L — ABNORMAL LOW (ref 38–126)
Anion gap: 8 (ref 5–15)
BUN: 27 mg/dL — ABNORMAL HIGH (ref 8–23)
CO2: 35 mmol/L — ABNORMAL HIGH (ref 22–32)
Calcium: 8.9 mg/dL (ref 8.9–10.3)
Chloride: 88 mmol/L — ABNORMAL LOW (ref 98–111)
Creatinine, Ser: 0.73 mg/dL (ref 0.61–1.24)
GFR, Estimated: >60 mL/min (ref >60)
Glucose, Bld: 133 mg/dL — ABNORMAL HIGH (ref 70–99)
Potassium: 4.8 mmol/L (ref 3.5–5.1)
Sodium: 132 mmol/L — ABNORMAL LOW (ref 135–145)
Total Bilirubin: 0.6 mg/dL (ref 0.0–1.2)
Total Protein: 6.1 g/dL — ABNORMAL LOW (ref 6.5–8.1)

## 2023-12-02 LAB — GLUCOSE, CAPILLARY
Glucose-Capillary: 122 mg/dL — ABNORMAL HIGH (ref 70–99)
Glucose-Capillary: 203 mg/dL — ABNORMAL HIGH (ref 70–99)
Glucose-Capillary: 240 mg/dL — ABNORMAL HIGH (ref 70–99)
Glucose-Capillary: 274 mg/dL — ABNORMAL HIGH (ref 70–99)

## 2023-12-02 LAB — MAGNESIUM: Magnesium: 2.6 mg/dL — ABNORMAL HIGH (ref 1.7–2.4)

## 2023-12-02 MED ORDER — HYDRALAZINE HCL 20 MG/ML IJ SOLN: 10.0000 mg | INTRAMUSCULAR | Status: DC | PRN

## 2023-12-02 MED ORDER — AMLODIPINE BESYLATE 5 MG PO TABS
5.0000 mg | ORAL_TABLET | Freq: Every day | ORAL | Status: DC
  Administered 2023-12-02 – 2023-12-07 (×6): 5 mg via ORAL
  Filled 2023-12-02 (×6): qty 1

## 2023-12-02 NOTE — TOC Progression Note (Signed)
 Transition of Care [facility]) - Progression Note    Patient Details  Name: GEOVANNI RAHMING MRN: [id number] Date of Birth: Jul 17, 1940  Transition of Care [facility]) CM/SW Contact  [name], RN Phone Number: 12/02/2023, 3:16 PM  Clinical Narrative:    NCM submitted for insurance auth. Insurance shara is approved from 12/02/2023- 12/06/2023. Approved for 5 days. Auth ID [id number]  Pt will have available bed at [facility] on Monday 9/8. IP Care Management is continuing to follow.  Expected Discharge Plan: Skilled Nursing Facility Barriers to Discharge: Continued Medical Work up               Expected Discharge Plan and Services In-house Referral: Clinical Social Work Discharge Planning Services: NA Post Acute Care Choice: Home Health Living arrangements for the past 2 months: Single Family Home                 DME Arranged: N/A DME Agency: NA       HH Arranged: PT, RN HH Agency: [facility] Date HH Agency Contacted: 11/28/23 Time HH Agency Contacted: 1526 Representative spoke with at [facility] Agency: [facility]   Social Drivers of Health (SDOH) Interventions SDOH Screenings   Food Insecurity: No Food Insecurity (11/26/2023)  Housing: High Risk (11/26/2023)  Transportation Needs: No Transportation Needs (11/26/2023)  Utilities: At Risk (11/26/2023)  Physical Activity: Inactive (11/10/2017)  Social Connections: Moderately Integrated (11/26/2023)  Stress: Stress Concern Present (11/10/2017)  Tobacco Use: Medium Risk (11/25/2023)    Readmission Risk Interventions    11/28/2023    3:24 PM  Readmission Risk Prevention Plan  Post Dischage Appt Complete  Medication Screening Complete  Transportation Screening Complete

## 2023-12-02 NOTE — Progress Notes (Signed)
 Physical Therapy Treatment Patient Details Name: Jordan Hebert MRN: [id number] DOB: 1940-10-26 Today's Date: 12/02/2023   History of Present Illness Pt admitted from home2* SOB with COPD exacerbation.  Pt with hx of CAD, s/p CABG, aortic valve replacement, and spinal stenosis    PT Comments  Pt seen in ICU RM# [id number] Pt was in bed on 6 lts nasal at rest 96%. AxO x 3 pleasant with slight slow responses but following all commands.  Feeling better but also very weak.  Assisted to EOB was difficult.  General bed mobility comments: Required increased time and effort to transition to EOB with increased assist for upper body and to complete scooting to EOB.  Allowed increased time static sitting while monitoring vitals. General transfer comment: from elevated bed, Pt required + 2 assist for safety/multiple lines at Mod/Min Assist and VC's on proper hand placement.  Esp to avoid pulling self up on walker.  Stand to sit required VC's to reach back prior as Pt sat quickly due to fatigue. General Gait Details: Very unsteady gait requiring + 2 assist for safety and such that recliner was following.  Pt remained on 6 lts nasal.  Unable to wean due to increased Dyspne with activity and sats hovering in low 90s.  Hr increased from 87 at rest 136.  Distance limited to 26 feet.  Max c/o fatigue after. Prior to admit, Pt was Indep amb in his home and living with his Spouse.  He did use a scooter for community distance.  He was NOT on oxygen . LPT has rec Pt will need ST Rehab at SNF to address mobility and functional decline prior to safely returning home.    If plan is discharge home, recommend the following: A little help with walking and/or transfers;A little help with bathing/dressing/bathroom;Assistance with cooking/housework;Assist for transportation   Can travel by private vehicle     No  Equipment Recommendations  None recommended by PT    Recommendations for Other Services       Precautions /  Restrictions Precautions Precautions: Fall Precaution/Restrictions Comments: monitor sats Restrictions Weight Bearing Restrictions Per Provider Order: No     Mobility  Bed Mobility Overal bed mobility: Needs Assistance Bed Mobility: Supine to Sit     Supine to sit: Mod assist, Min assist     General bed mobility comments: Required increased time and effort to transition to EOB with increased assist for upper body and to complete scooting to EOB.  Allowed increased time static sitting while monitoring vitals.    Transfers   Equipment used: Rollator (4 wheels) Transfers: Sit to/from Stand Sit to Stand: Min assist, Mod assist, +2 physical assistance, +2 safety/equipment, From elevated surface           General transfer comment: from elevated bed, Pt required + 2 assist for safety/multiple lines at Mod/Min Assist and VC's on proper hand placement.  Esp to avoid pulling self up on walker.  Stand to sit required VC's to reach back prior as Pt sat quickly due to fatigue.    Ambulation/Gait Ambulation/Gait assistance: Min assist, +2 physical assistance, +2 safety/equipment Gait Distance (Feet): 26 Feet Assistive device: Rollator (4 wheels) Gait Pattern/deviations: Step-through pattern, Decreased step length - right, Decreased step length - left, Shuffle, Trunk flexed Gait velocity: decreased     General Gait Details: Very unsteady gait requiring + 2 assist for safety and such that recliner was following.  Pt remained on 6 lts nasal.  Unable to wean due to increased  Dyspne with activity and sats hovering in low 90s.  Hr increased from 87 at rest 136.  Distance limited to 26 feet.  Max c/o fatigue after.   Stairs             Wheelchair Mobility     Tilt Bed    Modified Rankin (Stroke Patients Only)       Balance                                            Communication Communication Communication: Impaired Factors Affecting Communication:  Hearing impaired  Cognition       PT - Cognitive impairments: No apparent impairments                       PT - Cognition Comments: AxO x 3 pleasant with slight slow responses but following all commands.  Feeling better but also very weak. Following commands: Impaired      Cueing Cueing Techniques: Verbal cues  Exercises      General Comments        Pertinent Vitals/Pain Pain Assessment Pain Assessment: No/denies pain    Home Living                          Prior Function            PT Goals (current goals can now be found in the care plan section) Progress towards PT goals: Progressing toward goals    Frequency    Min 2X/week      PT Plan      Co-evaluation              AM-PAC PT 6 Clicks Mobility   Outcome Measure  Help needed turning from your back to your side while in a flat bed without using bedrails?: A Lot Help needed moving from lying on your back to sitting on the side of a flat bed without using bedrails?: A Lot Help needed moving to and from a bed to a chair (including a wheelchair)?: A Lot Help needed standing up from a chair using your arms (e.g., wheelchair or bedside chair)?: A Lot Help needed to walk in hospital room?: A Lot Help needed climbing 3-5 steps with a railing? : Total 6 Click Score: 11    End of Session Equipment Utilized During Treatment: Gait belt;Oxygen  Activity Tolerance: Patient limited by fatigue Patient left: in chair;with call bell/phone within reach;with chair alarm set Nurse Communication: Mobility status PT Visit Diagnosis: Difficulty in walking, not elsewhere classified (R26.2)     Time: 9093-9064 PT Time Calculation (min) (ACUTE ONLY): 29 min  Charges:    $Gait Training: 8-22 mins $Therapeutic Activity: 8-22 mins PT General Charges $$ ACUTE PT VISIT: 1 Visit                     Katheryn Leap  PTA Acute  Rehabilitation Services Office M-F          [phone]

## 2023-12-02 NOTE — Progress Notes (Signed)
 Progress Note   Patient: Jordan Hebert FMW:[id number] DOB: 05-Sep-1940 DOA: 11/25/2023     6 DOS: the patient was seen and examined on 12/02/2023   Brief hospital course: 83 year old man PMH COPD, CAD s/p CABG, aortic valve stenosis, gout, hyperlipidemia, hypothyroidism, hypertension, chronic thrombocytopenia, hearing impairment, and tobacco use disorder presented to [facility] with reports of increased work of breath, wheezing which has been progressively getting worse. Patient was hospitalized for further management of COPD exacerbation.    Assessment and Plan: COPD with acute exacerbation/acute on chronic respiratory failure with hypoxia and hypercapnia Will need BiPAP at nighttime due to hypercapnia.  Either BiPAP or trilogy ventilator will need to be arranged on d/c.  Patient reportedly promised to be compliant with his BiPAP COVID-19 flu and RSV PCR negative. Respiratory status has improved.  His wheezing has improved.  ABG from this morning shows improvement in hypercapnia and pH. Continue with Solu-Medrol  and nebulizer treatment.  He is on budesonide  nebulizer as well.  Continue doxycycline . Plan to de-escalate steroids over time Will need home oxygen . Required 6lpm with ambulation. Was hypoxic on RA. CT chest reviewed. Findings c/w mucus plugging. Afebrile, no leukocytosis, no significant cough. Cont mucinex  and routine flutter valve 2d echo reviewed. Normal LVEF with mildly reduced RV function Ambulating in hallway with PT. Wean O2 as tolerated   Patient requires noninvasive ventilation due to COPD consequent to chronic respiratory failure. OSA is not the cause of the patient's hypercapnia. Bilevel has been ruled out as insufficient due to lack of backup batteries, lack of alarms, and lack of guaranteed pressure support. Patient has required ventilatory support with higher pressure support to lower his pCO2 levels as seen in ABG results. Without noninvasive ventilation, the patient will  likely be readmitted to the hospital for an exacerbation, experience risk of harm or death.    Coronary artery disease status post CABG Cardiac status is stable.  Continue aspirin  Plavix .  He is noted to be on metoprolol .  Recently noted to be bradycardic.  Metoprolol  was discontinued.  Heart rate has improved.  Continue statin.    Hyperkalemia Reason for this is not entirely clear.  Improved after he was given Lokelma .  Could be related to steroids.  Potassium level stable.  Give dose of Lokelma  today.  Recheck labs in the morning.   Hyperglycemia Likely secondary to steroids. Check A1c. SSI. Cont glargine but anticipate CBG's to improve as steroid is tapered off.   Chronic thrombocytopenia Stable.   Tobacco use disorder Counseled.   Class II obesity Estimated body mass index is 38.45 kg/m as calculated from the following:   Height as of this encounter: 5' 9 (1.753 m).   Weight as of this encounter: 118.1 kg.       Subjective: Pt observed ambulating in hallway with PT. Reports feeling better, admits to generally feeling weaker than baseline  Physical Exam: Vitals:   12/02/23 0952 12/02/23 1130 12/02/23 1200 12/02/23 1400  BP: (!) 145/89  (!) 168/101 (!) 160/100  Pulse:   85 82  Resp:      Temp:  98.3 F (36.8 C)    TempSrc:  Oral    SpO2:   (!) 88% 92%  Weight:      Height:       General exam: Awake, laying in bed, in nad Respiratory system: Normal respiratory effort, no wheezing Cardiovascular system: regular rate, s1, s2 Gastrointestinal system: Soft, nondistended, positive BS Central nervous system: CN2-12 grossly intact, strength intact Extremities: Perfused, no  clubbing Skin: Normal skin turgor, no notable skin lesions seen Psychiatry: Mood normal // no visual hallucinations   Data Reviewed:  Labs reviewed: Na 132, K 4.8, K 0.73  Family Communication: Pt in room, family not at bedside  Disposition: Status is: Inpatient Remains inpatient appropriate  because: severity of illness  Planned Discharge Destination: Skilled nursing facility    Author: Garnette Pelt, MD 12/02/2023 2:48 PM  For on call review [email or web address].

## 2023-12-03 DIAGNOSIS — J441 Chronic obstructive pulmonary disease with (acute) exacerbation: Secondary | ICD-10-CM | POA: Diagnosis not present

## 2023-12-03 LAB — COMPREHENSIVE METABOLIC PANEL WITH GFR
ALT: 47 U/L — ABNORMAL HIGH (ref 0–44)
AST: 24 U/L (ref 15–41)
Albumin: 3.9 g/dL (ref 3.5–5.0)
Alkaline Phosphatase: 32 U/L — ABNORMAL LOW (ref 38–126)
Anion gap: 8 (ref 5–15)
BUN: 34 mg/dL — ABNORMAL HIGH (ref 8–23)
CO2: 35 mmol/L — ABNORMAL HIGH (ref 22–32)
Calcium: 9.1 mg/dL (ref 8.9–10.3)
Chloride: 89 mmol/L — ABNORMAL LOW (ref 98–111)
Creatinine, Ser: 0.81 mg/dL (ref 0.61–1.24)
GFR, Estimated: >60 mL/min (ref >60)
Glucose, Bld: 133 mg/dL — ABNORMAL HIGH (ref 70–99)
Potassium: 4.7 mmol/L (ref 3.5–5.1)
Sodium: 131 mmol/L — ABNORMAL LOW (ref 135–145)
Total Bilirubin: 0.6 mg/dL (ref 0.0–1.2)
Total Protein: 6.4 g/dL — ABNORMAL LOW (ref 6.5–8.1)

## 2023-12-03 LAB — GLUCOSE, CAPILLARY
Glucose-Capillary: 125 mg/dL — ABNORMAL HIGH (ref 70–99)
Glucose-Capillary: 200 mg/dL — ABNORMAL HIGH (ref 70–99)
Glucose-Capillary: 265 mg/dL — ABNORMAL HIGH (ref 70–99)
Glucose-Capillary: 207 mg/dL — ABNORMAL HIGH (ref 70–99)

## 2023-12-03 LAB — CBC
HCT: 47.2 % (ref 39.0–52.0)
Hemoglobin: 14.9 g/dL (ref 13.0–17.0)
MCH: 32.1 pg (ref 26.0–34.0)
MCHC: 31.6 g/dL (ref 30.0–36.0)
MCV: 101.7 fL — ABNORMAL HIGH (ref 80.0–100.0)
Platelets: 127 K/uL — ABNORMAL LOW (ref 150–400)
RBC: 4.64 MIL/uL (ref 4.22–5.81)
RDW: 14 % (ref 11.5–15.5)
WBC: 9.4 K/uL (ref 4.0–10.5)
nRBC: 0 % (ref 0.0–0.2)

## 2023-12-03 MED ORDER — DOCUSATE SODIUM 100 MG PO CAPS
100.0000 mg | ORAL_CAPSULE | Freq: Two times a day (BID) | ORAL | Status: DC
  Administered 2023-12-03 – 2023-12-07 (×9): 100 mg via ORAL
  Filled 2023-12-03 (×9): qty 1

## 2023-12-03 NOTE — Progress Notes (Signed)
   12/03/23 2305  BiPAP/CPAP/SIPAP  BiPAP/CPAP/SIPAP V60  Mask Type Full face mask  Dentures removed? Not applicable  Mask Size Extra large  Set Rate 18 breaths/min  Respiratory Rate 20 breaths/min  IPAP 18 cmH20  EPAP 8 cmH2O  FiO2 (%) 40 %  Minute Ventilation 10.4  Leak 24  Peak Inspiratory Pressure (PIP) 18  Tidal Volume (Vt) 467  Patient Home Machine No  Patient Home Mask No  Patient Home Tubing No  Auto Titrate No  Press High Alarm 30 cmH2O  Press Low Alarm 5 cmH2O  Device Plugged into RED Power Outlet Yes  BiPAP/CPAP /SiPAP Vitals  Pulse Rate 65  SpO2 95 %

## 2023-12-03 NOTE — Plan of Care (Signed)

## 2023-12-03 NOTE — Progress Notes (Signed)
 Progress Note   Patient: Jordan Hebert FMW:[id number] DOB: [birth year] DOA: 11/25/2023     7 DOS: the patient was seen and examined on 12/03/2023   Brief hospital course: 83 year old man PMH COPD, CAD s/p CABG, aortic valve stenosis, gout, hyperlipidemia, hypothyroidism, hypertension, chronic thrombocytopenia, hearing impairment, and tobacco use disorder presented to [facility] with reports of increased work of breath, wheezing which has been progressively getting worse. Patient was hospitalized for further management of COPD exacerbation.    Assessment and Plan: COPD with acute exacerbation/acute on chronic respiratory failure with hypoxia and hypercapnia Will need BiPAP at nighttime due to hypercapnia.  Either BiPAP or trilogy ventilator will need to be arranged on d/c.  Patient reportedly promised to be compliant with his BiPAP COVID-19 flu and RSV PCR negative. Respiratory status has improved.  His wheezing has improved.  ABG from this morning shows improvement in hypercapnia and pH. Continue with Solu-Medrol  and nebulizer treatment.  He is on budesonide  nebulizer as well.  Continue doxycycline  through 9/7 Plan to de-escalate steroids over time Will need home oxygen . Required 6lpm with ambulation. Was hypoxic on RA. CT chest reviewed. Findings c/w mucus plugging. Afebrile, no leukocytosis, no significant cough. Cont mucinex  and routine flutter valve 2d echo reviewed. Normal LVEF with mildly reduced RV function Ambulating in hallway with PT. Wean O2 as tolerated   Patient requires noninvasive ventilation due to COPD consequent to chronic respiratory failure. OSA is not the cause of the patient's hypercapnia. Bilevel has been ruled out as insufficient due to lack of backup batteries, lack of alarms, and lack of guaranteed pressure support. Patient has required ventilatory support with higher pressure support to lower his pCO2 levels as seen in ABG results. Without noninvasive ventilation, the  patient will likely be readmitted to the hospital for an exacerbation, experience risk of harm or death.    Coronary artery disease status post CABG Cardiac status is stable.  Continue aspirin  Plavix .  He is noted to be on metoprolol .  Recently noted to be bradycardic.  Metoprolol  was discontinued.  Heart rate has improved.  Continue statin.    Hyperkalemia Reason for this is not entirely clear.  Improved after he was given Lokelma .  Could be related to steroids.  Potassium level stable.  Give dose of Lokelma  today.  Recheck labs in the morning.   Hyperglycemia Likely secondary to steroids. Check A1c. SSI. Cont glargine but anticipate CBG's to improve as steroid is tapered off.   Chronic thrombocytopenia Stable.   Tobacco use disorder Counseled.   Class II obesity Estimated body mass index is 38.45 kg/m as calculated from the following:   Height as of this encounter: 5' 9 (1.753 m).   Weight as of this encounter: 118.1 kg.       Subjective: Without complaints this AM. States breathing about the same as yesterday  Physical Exam: Vitals:   12/03/23 1000 12/03/23 1200 12/03/23 1347 12/03/23 1351  BP: 126/71 103/68    Pulse:  79    Resp: 14 15    Temp:   97.6 F (36.4 C)   TempSrc:   Axillary   SpO2:  92%  91%  Weight:      Height:       General exam: Conversant, in no acute distress Respiratory system: normal chest rise, clear, no audible wheezing Cardiovascular system: regular rhythm, s1-s2 Gastrointestinal system: Nondistended, nontender, pos BS Central nervous system: No seizures, no tremors Extremities: No cyanosis, no joint deformities Skin: No rashes, no pallor Psychiatry:  Affect normal // no auditory hallucinations   Data Reviewed:  Labs reviewed: Na 131, K 4.7, Cr 0.81, WBC 9.4, Hgb 14.9, Plts 127  Family Communication: Pt in room, family not at bedside  Disposition: Status is: Inpatient Remains inpatient appropriate because: severity of illness   Planned Discharge Destination: Skilled nursing facility    Author: Garnette Pelt, MD 12/03/2023 3:12 PM  For on call review [email or web address].

## 2023-12-04 DIAGNOSIS — J441 Chronic obstructive pulmonary disease with (acute) exacerbation: Secondary | ICD-10-CM | POA: Diagnosis not present

## 2023-12-04 LAB — COMPREHENSIVE METABOLIC PANEL WITH GFR
ALT: 43 U/L (ref 0–44)
AST: 20 U/L (ref 15–41)
Albumin: 3.7 g/dL (ref 3.5–5.0)
Alkaline Phosphatase: 31 U/L — ABNORMAL LOW (ref 38–126)
Anion gap: 8 (ref 5–15)
BUN: 31 mg/dL — ABNORMAL HIGH (ref 8–23)
CO2: 33 mmol/L — ABNORMAL HIGH (ref 22–32)
Calcium: 9.2 mg/dL (ref 8.9–10.3)
Chloride: 89 mmol/L — ABNORMAL LOW (ref 98–111)
Creatinine, Ser: 0.7 mg/dL (ref 0.61–1.24)
GFR, Estimated: >60 mL/min (ref >60)
Glucose, Bld: 162 mg/dL — ABNORMAL HIGH (ref 70–99)
Potassium: 5.2 mmol/L — ABNORMAL HIGH (ref 3.5–5.1)
Sodium: 130 mmol/L — ABNORMAL LOW (ref 135–145)
Total Bilirubin: 0.6 mg/dL (ref 0.0–1.2)
Total Protein: 6.2 g/dL — ABNORMAL LOW (ref 6.5–8.1)

## 2023-12-04 LAB — CBC
HCT: 45.7 % (ref 39.0–52.0)
Hemoglobin: 14.7 g/dL (ref 13.0–17.0)
MCH: 32.2 pg (ref 26.0–34.0)
MCHC: 32.2 g/dL (ref 30.0–36.0)
MCV: 100.2 fL — ABNORMAL HIGH (ref 80.0–100.0)
Platelets: 124 K/uL — ABNORMAL LOW (ref 150–400)
RBC: 4.56 MIL/uL (ref 4.22–5.81)
RDW: 13.9 % (ref 11.5–15.5)
WBC: 10.5 K/uL (ref 4.0–10.5)
nRBC: 0 % (ref 0.0–0.2)

## 2023-12-04 LAB — GLUCOSE, CAPILLARY
Glucose-Capillary: 134 mg/dL — ABNORMAL HIGH (ref 70–99)
Glucose-Capillary: 215 mg/dL — ABNORMAL HIGH (ref 70–99)
Glucose-Capillary: 280 mg/dL — ABNORMAL HIGH (ref 70–99)
Glucose-Capillary: 289 mg/dL — ABNORMAL HIGH (ref 70–99)

## 2023-12-04 NOTE — Progress Notes (Signed)
 Progress Note   Patient: Jordan Hebert FMW:[id number] DOB: June 11, 1940 DOA: 11/25/2023     8 DOS: the patient was seen and examined on 12/04/2023   Brief hospital course: 83 year old man PMH COPD, CAD s/p CABG, aortic valve stenosis, gout, hyperlipidemia, hypothyroidism, hypertension, chronic thrombocytopenia, hearing impairment, and tobacco use disorder presented to [facility] with reports of increased work of breath, wheezing which has been progressively getting worse. Patient was hospitalized for further management of COPD exacerbation.    Assessment and Plan: COPD with acute exacerbation/acute on chronic respiratory failure with hypoxia and hypercapnia Will need BiPAP at nighttime due to hypercapnia.  Either BiPAP or trilogy ventilator will need to be arranged on d/c.  Patient reportedly promised to be compliant with his BiPAP COVID-19 flu and RSV PCR negative. Respiratory status has improved.  His wheezing has improved.  ABG from this morning shows improvement in hypercapnia and pH. Continue with Solu-Medrol  and nebulizer treatment.  He is on budesonide  nebulizer as well.  Continue doxycycline  through 9/7 Plan to de-escalate steroids over time Will need home oxygen . Required 6lpm with ambulation. Was hypoxic on RA. CT chest reviewed. Findings c/w mucus plugging. Afebrile, no leukocytosis, no significant cough. Cont mucinex  and routine flutter valve 2d echo reviewed. Normal LVEF with mildly reduced RV function O2 weaned to [facility] this AM   Patient requires noninvasive ventilation due to COPD consequent to chronic respiratory failure. OSA is not the cause of the patient's hypercapnia. Bilevel has been ruled out as insufficient due to lack of backup batteries, lack of alarms, and lack of guaranteed pressure support. Patient has required ventilatory support with higher pressure support to lower his pCO2 levels as seen in ABG results. Without noninvasive ventilation, the patient will likely be  readmitted to the hospital for an exacerbation, experience risk of harm or death.    Coronary artery disease status post CABG Cardiac status is stable.  Continue aspirin  Plavix .  He is noted to be on metoprolol .  Recently noted to be bradycardic.  Metoprolol  was discontinued.  Heart rate has improved.  Continue statin.    Hyperkalemia Reason for this is not entirely clear.  Improved after he was given Lokelma .  Could be related to steroids.  Potassium level stable.  Give dose of Lokelma  today.  Recheck labs in the morning.   Hyperglycemia Likely secondary to steroids. Check A1c. SSI. Cont glargine but anticipate CBG's to improve as steroid is tapered off.   Chronic thrombocytopenia Stable.   Tobacco use disorder Counseled.   Class II obesity Recommend diet/lifestyle modification       Subjective: Reports breathing better this AM  Physical Exam: Vitals:   12/04/23 1200 12/04/23 1207 12/04/23 1229 12/04/23 1350  BP:  120/61    Pulse: 74     Resp: 14 20    Temp:   97.6 F (36.4 C)   TempSrc:   Axillary   SpO2: 90%   (!) 87%  Weight:      Height:       General exam: Awake, laying in bed, in nad Respiratory system: Normal respiratory effort, no wheezing Cardiovascular system: regular rate, s1, s2 Gastrointestinal system: Soft, nondistended, positive BS Central nervous system: CN2-12 grossly intact, strength intact Extremities: Perfused, no clubbing Skin: Normal skin turgor, no notable skin lesions seen Psychiatry: Mood normal // affect normal  Data Reviewed:  Labs reviewed: Na 130, K 5.2, Cr 0.70, WBC 10.5, Hgb 14.7, Plts 124  Family Communication: Pt in room, family not at bedside  Disposition: Status is: Inpatient Remains inpatient appropriate because: severity of illness  Planned Discharge Destination: Skilled nursing facility    Author: Garnette Pelt, MD 12/04/2023 3:03 PM  For on call review [email or web address].

## 2023-12-04 NOTE — Progress Notes (Signed)
   12/04/23 2320  BiPAP/CPAP/SIPAP  BiPAP/CPAP/SIPAP Pt Type Adult  BiPAP/CPAP/SIPAP V60  Mask Type Full face mask  Dentures removed? Yes - Placed in denture cup  Mask Size Extra large  Set Rate 18 breaths/min  Respiratory Rate 19 breaths/min  IPAP 18 cmH20  EPAP 8 cmH2O  FiO2 (%) 40 %  Minute Ventilation 12.1  Leak 15  Peak Inspiratory Pressure (PIP) 18  Tidal Volume (Vt) 610  Patient Home Machine No  Patient Home Mask No  Patient Home Tubing No  Auto Titrate No  Press High Alarm 30 cmH2O  Press Low Alarm 5 cmH2O  Device Plugged into RED Power Outlet Yes  Oxygen  Percent 40 %  BiPAP/CPAP /SiPAP Vitals  Temp 97.8 F (36.6 C)  MEWS Score/Color  MEWS Score 0  MEWS Score Color Green

## 2023-12-05 DIAGNOSIS — J441 Chronic obstructive pulmonary disease with (acute) exacerbation: Secondary | ICD-10-CM | POA: Diagnosis not present

## 2023-12-05 LAB — GLUCOSE, CAPILLARY
Glucose-Capillary: 162 mg/dL — ABNORMAL HIGH (ref 70–99)
Glucose-Capillary: 190 mg/dL — ABNORMAL HIGH (ref 70–99)
Glucose-Capillary: 186 mg/dL — ABNORMAL HIGH (ref 70–99)
Glucose-Capillary: 250 mg/dL — ABNORMAL HIGH (ref 70–99)
Glucose-Capillary: 258 mg/dL — ABNORMAL HIGH (ref 70–99)
Glucose-Capillary: 239 mg/dL — ABNORMAL HIGH (ref 70–99)

## 2023-12-05 LAB — COMPREHENSIVE METABOLIC PANEL WITH GFR
ALT: 58 U/L — ABNORMAL HIGH (ref 0–44)
AST: 27 U/L (ref 15–41)
Albumin: 4.1 g/dL (ref 3.5–5.0)
Alkaline Phosphatase: 35 U/L — ABNORMAL LOW (ref 38–126)
Anion gap: 9 (ref 5–15)
BUN: 32 mg/dL — ABNORMAL HIGH (ref 8–23)
CO2: 31 mmol/L (ref 22–32)
Calcium: 9.5 mg/dL (ref 8.9–10.3)
Chloride: 88 mmol/L — ABNORMAL LOW (ref 98–111)
Creatinine, Ser: 0.86 mg/dL (ref 0.61–1.24)
GFR, Estimated: >60 mL/min (ref >60)
Glucose, Bld: 160 mg/dL — ABNORMAL HIGH (ref 70–99)
Potassium: 5.5 mmol/L — ABNORMAL HIGH (ref 3.5–5.1)
Sodium: 129 mmol/L — ABNORMAL LOW (ref 135–145)
Total Bilirubin: 0.8 mg/dL (ref 0.0–1.2)
Total Protein: 6.8 g/dL (ref 6.5–8.1)

## 2023-12-05 LAB — CBC
HCT: 47.3 % (ref 39.0–52.0)
Hemoglobin: 15.4 g/dL (ref 13.0–17.0)
MCH: 32.6 pg (ref 26.0–34.0)
MCHC: 32.6 g/dL (ref 30.0–36.0)
MCV: 100.2 fL — ABNORMAL HIGH (ref 80.0–100.0)
Platelets: 132 K/uL — ABNORMAL LOW (ref 150–400)
RBC: 4.72 MIL/uL (ref 4.22–5.81)
RDW: 13.7 % (ref 11.5–15.5)
WBC: 10.4 K/uL (ref 4.0–10.5)
nRBC: 0 % (ref 0.0–0.2)

## 2023-12-05 MED ORDER — SODIUM ZIRCONIUM CYCLOSILICATE 10 G PO PACK
10.0000 g | PACK | Freq: Two times a day (BID) | ORAL | Status: AC
  Administered 2023-12-05 (×2): 10 g via ORAL
  Filled 2023-12-05 (×2): qty 1

## 2023-12-05 NOTE — Progress Notes (Signed)
 Physical Therapy Treatment Patient Details Name: Jordan Hebert MRN: [id number] DOB: 1940/09/03 Today's Date: 12/05/2023   History of Present Illness Pt admitted from home2* SOB with COPD exacerbation.  Pt with hx of CAD, s/p CABG, aortic valve replacement, and spinal stenosis    PT Comments   The patient seated in recliner, McConnell out of nares, SPo2 87%. Replaced O2, back up to 92% on 3 L.   Patient ambulated using RW and 3 L then upped to 4 L due to SPo2 dropping to 86%, remained  91% on 4 /L Patient will benefit from continued inpatient follow up therapy, <3 hours/day    If plan is discharge home, recommend the following: A little help with walking and/or transfers;A little help with bathing/dressing/bathroom;Assistance with cooking/housework;Assist for transportation   Can travel by private vehicle     No  Equipment Recommendations  None recommended by PT    Recommendations for Other Services       Precautions / Restrictions Precautions Precautions: Fall Precaution/Restrictions Comments: monitor sats Restrictions Weight Bearing Restrictions Per Provider Order: No     Mobility  Bed Mobility   Bed Mobility: Supine to Sit           General bed mobility comments: in recliner    Transfers Overall transfer level: Needs assistance Equipment used: Rolling walker (2 wheels) Transfers: Sit to/from Stand Sit to Stand: Min assist           General transfer comment: assist to power up to stand from lower recliner, cues for hand placement    Ambulation/Gait Ambulation/Gait assistance: Min assist, +2 safety/equipment Gait Distance (Feet): 90 Feet Assistive device: Rolling walker (2 wheels) Gait Pattern/deviations: Step-through pattern, Decreased step length - right, Decreased step length - left, Shuffle, Trunk flexed       General Gait Details: cues for position inside Rw.. patient stopped x 3 for standing break.   Stairs             Wheelchair Mobility      Tilt Bed    Modified Rankin (Stroke Patients Only)       Balance Overall balance assessment: Needs assistance Sitting-balance support: No upper extremity supported, Feet supported Sitting balance-Leahy Scale: Good     Standing balance support: Bilateral upper extremity supported, During functional activity, Reliant on assistive device for balance Standing balance-Leahy Scale: Poor                              Communication Communication Communication: Impaired Factors Affecting Communication: Hearing impaired  Cognition Arousal: Alert Behavior During Therapy: WFL for tasks assessed/performed   PT - Cognitive impairments: No apparent impairments                         Following commands: Intact      Cueing Cueing Techniques: Verbal cues, Gestural cues  Exercises      General Comments        Pertinent Vitals/Pain Pain Assessment Pain Assessment: No/denies pain    Home Living                          Prior Function            PT Goals (current goals can now be found in the care plan section) Progress towards PT goals: Progressing toward goals    Frequency    Min 2X/week  PT Plan      Co-evaluation              AM-PAC PT 6 Clicks Mobility   Outcome Measure  Help needed turning from your back to your side while in a flat bed without using bedrails?: A Lot Help needed moving from lying on your back to sitting on the side of a flat bed without using bedrails?: A Lot Help needed moving to and from a bed to a chair (including a wheelchair)?: A Lot Help needed standing up from a chair using your arms (e.g., wheelchair or bedside chair)?: A Lot Help needed to walk in hospital room?: A Lot Help needed climbing 3-5 steps with a railing? : Total 6 Click Score: 11    End of Session Equipment Utilized During Treatment: Gait belt;Oxygen  Activity Tolerance: Patient limited by fatigue;Patient tolerated  treatment well Patient left: in chair;with call bell/phone within reach Nurse Communication: Mobility status PT Visit Diagnosis: Difficulty in walking, not elsewhere classified (R26.2)     Time: 9098-9066 PT Time Calculation (min) (ACUTE ONLY): 32 min  Charges:    $Gait Training: 23-37 mins PT General Charges $$ ACUTE PT VISIT: 1 Visit                     Darice Potters PT Acute Rehabilitation Services Office [phone]    Potters Darice Norris 12/05/2023, 12:44 PM

## 2023-12-05 NOTE — Progress Notes (Signed)
 Progress Note   Patient: Jordan Hebert FMW:[id number] DOB: 1941-01-15 DOA: 11/25/2023     9 DOS: the patient was seen and examined on 12/05/2023   Brief hospital course: 83 year old man PMH COPD, CAD s/p CABG, aortic valve stenosis, gout, hyperlipidemia, hypothyroidism, hypertension, chronic thrombocytopenia, hearing impairment, and tobacco use disorder presented to [facility] with reports of increased work of breath, wheezing which has been progressively getting worse. Patient was hospitalized for further management of COPD exacerbation.    Assessment and Plan: COPD with acute exacerbation/acute on chronic respiratory failure with hypoxia and hypercapnia Had planned outpt BiPAP. Will plan CPAP for d/c COVID-19 flu and RSV PCR negative. Respiratory status has improved.  His wheezing has improved.  ABG from this morning shows improvement in hypercapnia and pH. Continue with Solu-Medrol  and nebulizer treatment.  He is on budesonide  nebulizer as well.  Continue doxycycline  through 9/7 Plan to de-escalate steroids over time Will need home oxygen . Earlier had required as high as 6lpm with ambulation. CT chest reviewed. Findings c/w mucus plugging. Afebrile, no leukocytosis, no significant cough. Cont mucinex  and routine flutter valve 2d echo reviewed. Normal LVEF with mildly reduced RV function O2 weaned to [facility] this AM   Patient requires noninvasive ventilation due to COPD consequent to chronic respiratory failure. OSA is not the cause of the patient's hypercapnia. Bilevel has been ruled out as insufficient due to lack of backup batteries, lack of alarms, and lack of guaranteed pressure support. Patient has required ventilatory support with higher pressure support to lower his pCO2 levels as seen in ABG results. Without noninvasive ventilation, the patient will likely be readmitted to the hospital for an exacerbation, experience risk of harm or death.    Coronary artery disease status post  CABG Cardiac status is stable.  Continue aspirin  Plavix .  He is noted to be on metoprolol .  Recently noted to be bradycardic.  Metoprolol  was discontinued.  Heart rate has improved.  Continue statin.    Hyperkalemia Reason for this is not entirely clear.  Improved after he was given Lokelma .  Could be related to steroids.   -K trending up this AM. Given lokelma  10g x2 more doses today   Hyperglycemia Likely secondary to steroids. Check A1c. SSI. Cont glargine but anticipate CBG's to improve as steroid is tapered off.   Chronic thrombocytopenia Stable.   Tobacco use disorder Counseled.   Class II obesity Recommend diet/lifestyle modification       Subjective: Feeling and breathing better today  Physical Exam: Vitals:   12/05/23 1600 12/05/23 1617 12/05/23 1700 12/05/23 1738  BP: (!) 150/80  (!) 145/78   Pulse: 78 74 77 84  Resp: 16 16 14 18   Temp:  98.1 F (36.7 C)    TempSrc:  Oral    SpO2: 91% 94% 94% 93%  Weight:      Height:       General exam: Conversant, in no acute distress Respiratory system: normal chest rise, clear, no audible wheezing Cardiovascular system: regular rhythm, s1-s2 Gastrointestinal system: Nondistended, nontender, pos BS Central nervous system: No seizures, no tremors Extremities: No cyanosis, no joint deformities Skin: No rashes, no pallor Psychiatry: Affect normal // no auditory hallucinations   Data Reviewed:  Labs reviewed: Na 129, K 5.5, Cr 0.86, WBC 10.4, Hgb 15.4, Plts 132  Family Communication: Pt in room, family not at bedside  Disposition: Status is: Inpatient Remains inpatient appropriate because: severity of illness  Planned Discharge Destination: Skilled nursing facility    Author: Garnette  Cindy, MD 12/05/2023 5:39 PM  For on call review [email or web address].

## 2023-12-05 NOTE — Progress Notes (Signed)
 Occupational Therapy Treatment Patient Details Name: Jordan Hebert MRN: [id number] DOB: 11-21-1940 Today's Date: 12/05/2023   History of present illness Pt admitted from home2* SOB with COPD exacerbation.  Pt with hx of CAD, s/p CABG, aortic valve replacement, and spinal stenosis   OT comments  Patient  seen for skilled OT session this afternoon. Patient up in recliner and requesting to remain post tx session and open to all activity presented with no pain. OT addressed activity tolerance in sitting and standing for ADL's and light mobility. See below for tasks and status. Patient requires continued Acute care hospital level OT services to progress safety and functional performance and allow for discharge. Patient will benefit from continued inpatient follow up therapy, <3 hours/day.         If plan is discharge home, recommend the following:  A lot of help with walking and/or transfers;A lot of help with bathing/dressing/bathroom;Assistance with cooking/housework;Direct supervision/assist for medications management;Direct supervision/assist for financial management;Assist for transportation;Help with stairs or ramp for entrance   Equipment Recommendations  Tub/shower bench       Precautions / Restrictions Precautions Precautions: Fall Precaution/Restrictions Comments: monitor sats Restrictions Weight Bearing Restrictions Per Provider Order: No       Mobility Bed Mobility Overal bed mobility:  (patient up in recliner and remained post session)                  Transfers Overall transfer level: Needs assistance Equipment used: Rolling walker (2 wheels) Transfers: Sit to/from Stand Sit to Stand: Min assist           General transfer comment: STS x 4 with cues for forward weight shifts and power up strategies from recliner with RW for support once standing and cues for breathing integ     Balance Overall balance assessment: Needs assistance Sitting-balance  support: No upper extremity supported, Feet supported Sitting balance-Leahy Scale: Good     Standing balance support: Bilateral upper extremity supported, During functional activity, Reliant on assistive device for balance Standing balance-Leahy Scale: Poor                             ADL either performed or assessed with clinical judgement   ADL Overall ADL's : Needs assistance/impaired                                     Functional mobility during ADLs: Minimal assistance;Rolling walker (2 wheels) General ADL Comments: educated on pacing and LB self care management with ECT and AE strategies    Extremity/Trunk Assessment Upper Extremity Assessment Upper Extremity Assessment: Overall WFL for tasks assessed;Right hand dominant   Lower Extremity Assessment Lower Extremity Assessment: Defer to PT evaluation                 Communication Communication Communication: Impaired Factors Affecting Communication: Hearing impaired   Cognition Arousal: Alert Behavior During Therapy: WFL for tasks assessed/performed Cognition: No apparent impairments             OT - Cognition Comments: mild increased processing time                 Following commands: Intact        Cueing   Cueing Techniques: Verbal cues, Gestural cues  Exercises Exercises: Other exercises (B grasp ball 25 reps x 4 sets)    Shoulder Instructions  General Comments worked on 5 sets of 25 reps balloon target toss with pacing and breathing integration focus with )2 sats between 92-94% on 2 ltrs O2 seated; cues to remain > 90% for STS breathing techniques    Pertinent Vitals/ Pain       Pain Assessment Pain Assessment: No/denies pain   Frequency  Min 2X/week        Progress Toward Goals  OT Goals(current goals can now be found in the care plan section)  Progress towards OT goals: Progressing toward goals  Acute Rehab OT Goals Patient Stated Goal: to  get stronger OT Goal Formulation: With patient Time For Goal Achievement: 12/12/23 Potential to Achieve Goals: Fair ADL Goals Pt Will Perform Lower Body Bathing: with set-up;with adaptive equipment;sit to/from stand Pt Will Perform Lower Body Dressing: sit to/from stand;with adaptive equipment;with min assist Pt Will Transfer to Toilet: with contact guard assist;regular height toilet;grab bars Pt Will Perform Toileting - Clothing Manipulation and hygiene: with set-up;sit to/from stand Pt Will Perform Tub/Shower Transfer: with min assist;tub bench;rolling walker;Tub transfer Pt/caregiver will Perform Home Exercise Program: Both right and left upper extremity;With written HEP provided;Independently Additional ADL Goal #1: Patient will teach back 4/5 ECT principles with ADL's and mobility with min cues  Plan         AM-PAC OT 6 Clicks Daily Activity     Outcome Measure   Help from another person eating meals?: A Little Help from another person taking care of personal grooming?: A Little Help from another person toileting, which includes using toliet, bedpan, or urinal?: A Lot Help from another person bathing (including washing, rinsing, drying)?: A Lot Help from another person to put on and taking off regular upper body clothing?: A Little Help from another person to put on and taking off regular lower body clothing?: A Lot 6 Click Score: 15    End of Session Equipment Utilized During Treatment: Gait belt;Rolling walker (2 wheels);Oxygen   OT Visit Diagnosis: Unsteadiness on feet (R26.81);Muscle weakness (generalized) (M62.81)   Activity Tolerance Patient tolerated treatment well   Patient Left in chair;with call bell/phone within reach;with chair alarm set   Nurse Communication Mobility status        Time: 8544-8474 OT Time Calculation (min): 30 min  Charges: OT General Charges $OT Visit: 1 Visit OT Treatments $Therapeutic Activity: 8-22 mins $Therapeutic Exercise: 8-22  mins  Starkeisha Vanwinkle OT/L Acute Rehabilitation Department  [phone]  12/05/2023, 4:18 PM

## 2023-12-06 DIAGNOSIS — J441 Chronic obstructive pulmonary disease with (acute) exacerbation: Secondary | ICD-10-CM | POA: Diagnosis not present

## 2023-12-06 LAB — COMPREHENSIVE METABOLIC PANEL WITH GFR
ALT: 66 U/L — ABNORMAL HIGH (ref 0–44)
AST: 37 U/L (ref 15–41)
Albumin: 4 g/dL (ref 3.5–5.0)
Alkaline Phosphatase: 36 U/L — ABNORMAL LOW (ref 38–126)
Anion gap: 10 (ref 5–15)
BUN: 31 mg/dL — ABNORMAL HIGH (ref 8–23)
CO2: 32 mmol/L (ref 22–32)
Calcium: 9.5 mg/dL (ref 8.9–10.3)
Chloride: 88 mmol/L — ABNORMAL LOW (ref 98–111)
Creatinine, Ser: 0.79 mg/dL (ref 0.61–1.24)
GFR, Estimated: >60 mL/min (ref >60)
Glucose, Bld: 181 mg/dL — ABNORMAL HIGH (ref 70–99)
Potassium: 5 mmol/L (ref 3.5–5.1)
Sodium: 131 mmol/L — ABNORMAL LOW (ref 135–145)
Total Bilirubin: 0.8 mg/dL (ref 0.0–1.2)
Total Protein: 6.6 g/dL (ref 6.5–8.1)

## 2023-12-06 LAB — GLUCOSE, CAPILLARY
Glucose-Capillary: 138 mg/dL — ABNORMAL HIGH (ref 70–99)
Glucose-Capillary: 155 mg/dL — ABNORMAL HIGH (ref 70–99)
Glucose-Capillary: 194 mg/dL — ABNORMAL HIGH (ref 70–99)
Glucose-Capillary: 203 mg/dL — ABNORMAL HIGH (ref 70–99)
Glucose-Capillary: 206 mg/dL — ABNORMAL HIGH (ref 70–99)

## 2023-12-06 LAB — CBC
HCT: 48.3 % (ref 39.0–52.0)
Hemoglobin: 16.4 g/dL (ref 13.0–17.0)
MCH: 33.1 pg (ref 26.0–34.0)
MCHC: 34 g/dL (ref 30.0–36.0)
MCV: 97.4 fL (ref 80.0–100.0)
Platelets: 128 K/uL — ABNORMAL LOW (ref 150–400)
RBC: 4.96 MIL/uL (ref 4.22–5.81)
RDW: 16 % — ABNORMAL HIGH (ref 11.5–15.5)
WBC: 11.7 K/uL — ABNORMAL HIGH (ref 4.0–10.5)
nRBC: 0 % (ref 0.0–0.2)

## 2023-12-06 MED ORDER — PREDNISONE 10 MG PO TABS: ORAL_TABLET | ORAL | Status: AC

## 2023-12-06 MED ORDER — LOKELMA 10 G PO PACK: 10.0000 g | PACK | Freq: Two times a day (BID) | ORAL | Status: AC

## 2023-12-06 MED ORDER — MELATONIN 5 MG PO TABS: 5.0000 mg | ORAL_TABLET | Freq: Every evening | ORAL | Status: AC | PRN

## 2023-12-06 MED ORDER — NYSTATIN 100000 UNIT/GM EX POWD: Freq: Three times a day (TID) | CUTANEOUS | Status: AC

## 2023-12-06 MED ORDER — BUDESONIDE 0.25 MG/2ML IN SUSP: 0.2500 mg | Freq: Two times a day (BID) | RESPIRATORY_TRACT | Status: AC

## 2023-12-06 MED ORDER — INSULIN GLARGINE 100 UNIT/ML ~~LOC~~ SOLN: 10.0000 [IU] | Freq: Every day | SUBCUTANEOUS | Status: AC

## 2023-12-06 MED ORDER — KETOCONAZOLE 2 % EX CREA: TOPICAL_CREAM | Freq: Two times a day (BID) | CUTANEOUS | Status: AC

## 2023-12-06 MED ORDER — NICOTINE POLACRILEX 2 MG MT GUM: 2.0000 mg | CHEWING_GUM | OROMUCOSAL | Status: AC | PRN

## 2023-12-06 MED ORDER — AMLODIPINE BESYLATE 5 MG PO TABS: 5.0000 mg | ORAL_TABLET | Freq: Every day | ORAL | Status: AC

## 2023-12-06 MED ORDER — INSULIN LISPRO (1 UNIT DIAL) 100 UNIT/ML (KWIKPEN): PEN_INJECTOR | SUBCUTANEOUS | Status: AC

## 2023-12-06 MED ORDER — DOCUSATE SODIUM 100 MG PO CAPS: 100.0000 mg | ORAL_CAPSULE | Freq: Two times a day (BID) | ORAL | Status: AC

## 2023-12-06 MED ORDER — NICOTINE 14 MG/24HR TD PT24: 14.0000 mg | MEDICATED_PATCH | Freq: Every day | TRANSDERMAL | Status: AC

## 2023-12-06 MED ORDER — GUAIFENESIN ER 600 MG PO TB12: 1200.0000 mg | ORAL_TABLET | Freq: Two times a day (BID) | ORAL | Status: AC

## 2023-12-06 MED ORDER — IPRATROPIUM-ALBUTEROL 0.5-2.5 (3) MG/3ML IN SOLN: 3.0000 mL | Freq: Three times a day (TID) | RESPIRATORY_TRACT | Status: AC

## 2023-12-06 MED ORDER — SODIUM ZIRCONIUM CYCLOSILICATE 10 G PO PACK
10.0000 g | PACK | Freq: Once | ORAL | Status: AC
Start: 2023-12-06 — End: 2023-12-06
  Administered 2023-12-06: 10 g via ORAL
  Filled 2023-12-06: qty 1

## 2023-12-06 NOTE — Progress Notes (Signed)
 Went by to place patient on CPAP.  Patient was still sitting up in chair and eating a snack.  Patient was not ready to go on his machine.  Asked patient to let his RN know when he was ready.

## 2023-12-06 NOTE — Discharge Summary (Signed)
 Physician Discharge Summary   Patient: Jordan Hebert MRN: [id number] DOB: 02/13/1941  Admit date:     11/25/2023  Discharge date: 12/06/23  Discharge Physician: Garnette Pelt   PCP: Stacia Millman, PA   Recommendations at discharge:    Follow up with your PCP in 1-2 weeks Continue CPAP at night Please monitor potassium levels daily for the next 5 days.  Follow AC HS glucose checks. Likely wean insulin  off as steroids are being tapered down  Discharge Diagnoses: Principal Problem:   COPD with acute exacerbation (HCC)  Resolved Problems:   * No resolved hospital problems. *  Hospital Course: 83 year old man PMH COPD, CAD s/p CABG, aortic valve stenosis, gout, hyperlipidemia, hypothyroidism, hypertension, chronic thrombocytopenia, hearing impairment, and tobacco use disorder presented to [facility] with reports of increased work of breath, wheezing which has been progressively getting worse. Patient was hospitalized for further management of COPD exacerbation.    Assessment and Plan: COPD with acute exacerbation/acute on chronic respiratory failure with hypoxia and hypercapnia Had planned outpt BiPAP later changed to CPAP for d/c COVID-19 flu and RSV PCR negative. Respiratory status has improved.  His wheezing has improved.  Had been continued with Solu-Medrol  and nebulizer treatment.  He is on budesonide  nebulizer as well.  Completed doxycycline  on 9/7 Plan to de-escalate steroids over time, prednisone  taper per med rec Will need home oxygen . Earlier had required as high as 6lpm with ambulation. CT chest reviewed. Findings c/w mucus plugging. Afebrile, no leukocytosis, no significant cough. Cont mucinex  and routine flutter valve 2d echo reviewed. Normal LVEF with mildly reduced RV function O2 weaned to [facility] this AM   Patient requires noninvasive ventilation due to COPD consequent to chronic respiratory failure. OSA is not the cause of the patient's hypercapnia. Bilevel has been  ruled out as insufficient due to lack of backup batteries, lack of alarms, and lack of guaranteed pressure support. Patient has required ventilatory support with higher pressure support to lower his pCO2 levels as seen in ABG results. Without noninvasive ventilation, the patient will likely be readmitted to the hospital for an exacerbation, experience risk of harm or death.    Coronary artery disease status post CABG Cardiac status is stable.  Continue aspirin  Plavix .  He is noted to be on metoprolol .  Recently noted to be bradycardic.  Metoprolol  was discontinued.  Heart rate has improved.  Continue statin.    Hyperkalemia Reason for this is not entirely clear.  Improved after he was given Lokelma .  Could be related to steroids.   -treated with lokelma   -wean prednisone  as per above -recommend daily BMET checks for the next 5 days to follow K trends   Hyperglycemia Likely secondary to steroids. Check A1c. SSI. Cont glargine but anticipate CBG's to improve as steroid is tapered off. Continue to follow AC HS at facility, Likely wean insulin  to off as tolerated   Chronic thrombocytopenia Stable.   Tobacco use disorder Counseled.   Class II obesity Recommend diet/lifestyle modification       Consultants:  Procedures performed:   Disposition: Skilled nursing facility Diet recommendation:  Cardiac diet DISCHARGE MEDICATION: Allergies as of 12/06/2023       Reactions   Viagra [sildenafil]         Medication List     STOP taking these medications    metoprolol  tartrate 25 MG tablet Commonly known as: LOPRESSOR        TAKE these medications    amLODipine  5 MG tablet Commonly known as: NORVASC   Take 1 tablet (5 mg total) by mouth daily. Start taking on: December 07, 2023   aspirin  EC 81 MG tablet Take 1 tablet (81 mg total) by mouth daily.   budesonide  0.25 MG/2ML nebulizer solution Commonly known as: PULMICORT  Take 2 mLs (0.25 mg total) by nebulization 2 (two)  times daily.   clopidogrel  75 MG tablet Commonly known as: PLAVIX  Take 1 tablet by mouth daily.   cyanocobalamin  1000 MCG tablet Commonly known as: VITAMIN B12 Take 1,000 mcg by mouth daily.   docusate sodium  100 MG capsule Commonly known as: COLACE Take 1 capsule (100 mg total) by mouth 2 (two) times daily.   guaiFENesin  600 MG 12 hr tablet Commonly known as: MUCINEX  Take 2 tablets (1,200 mg total) by mouth 2 (two) times daily.   insulin  glargine 100 UNIT/ML injection Commonly known as: LANTUS  Inject 0.1 mLs (10 Units total) into the skin daily. Start taking on: December 07, 2023   insulin  lispro 100 UNIT/ML KwikPen Commonly known as: HumaLOG  KwikPen Sliding scale AC HS CBG 70 - 120: 0 units  CBG 121 - 150: 2 units  CBG 151 - 200: 3 units  CBG 201 - 250: 5 units  CBG 251 - 300: 8 units  CBG 301 - 350: 11 units  CBG 351 - 400: 15 units  CBG > 400: call MD   ipratropium-albuterol  0.5-2.5 (3) MG/3ML Soln Commonly known as: DUONEB Take 3 mLs by nebulization 3 (three) times daily.   ketoconazole  2 % cream Commonly known as: NIZORAL  Apply topically 2 (two) times daily for 6 days.   levothyroxine  150 MCG tablet Commonly known as: SYNTHROID  Take 150 mcg by mouth daily before breakfast.   Lokelma  10 g Pack packet Generic drug: sodium zirconium cyclosilicate  Take 10 g by mouth 2 (two) times daily for 3 days.   melatonin 5 MG Tabs Take 1 tablet (5 mg total) by mouth at bedtime as needed.   nicotine  14 mg/24hr patch Commonly known as: NICODERM CQ  - dosed in mg/24 hours Place 1 patch (14 mg total) onto the skin daily. Start taking on: December 07, 2023   nicotine  polacrilex 2 MG gum Commonly known as: NICORETTE  Take 1 each (2 mg total) by mouth as needed for smoking cessation.   nystatin  powder Commonly known as: MYCOSTATIN /NYSTOP  Apply topically 3 (three) times daily for 6 days.   predniSONE  10 MG tablet Commonly known as: DELTASONE  Take 6 tablets (60 mg  total) by mouth daily for 2 days, THEN 4 tablets (40 mg total) daily for 2 days, THEN 2 tablets (20 mg total) daily for 2 days, THEN 1 tablet (10 mg total) daily for 2 days, THEN 0.5 tablets (5 mg total) daily for 2 days. Start taking on: December 06, 2023   rosuvastatin  40 MG tablet Commonly known as: CRESTOR  Take 1 tablet (40 mg total) by mouth daily.   sertraline  50 MG tablet Commonly known as: ZOLOFT  Take 50 mg by mouth daily.               Durable Medical Equipment  (From admission, onward)           Start     Ordered   11/29/23 0829  For home use only DME Bipap  Once       Question Answer Comment  Length of Need Lifetime   Bleed in oxygen  (LPM) 2   Keep O2 saturation between 88-92   Inspiratory pressure 20   Expiratory pressure 8  11/29/23 0828   11/29/23 0825  For home use only DME oxygen   Once       Question Answer Comment  Length of Need Lifetime   Mode or (Route) Nasal cannula   Liters per Minute 2   Frequency Continuous (stationary and portable oxygen  unit needed)   Oxygen  conserving device Yes   Oxygen  delivery system Gas      11/29/23 0826            Contact information for follow-up providers     [facility], [facility]  Follow up.   Why: Please continue home health PT services with this provider upon discharge. Contact information: [address] [place] [address] [phone]         [facility], [facility] Follow up.   Why: Your home Oxygen  and Bipap will be delivered through [facility]. Contact information: [address] [address] [phone]              Contact information for after-discharge care     Destination     [facility] .   Service: Skilled Nursing Contact information: [address] [place]  [address] [phone]                    Discharge Exam: Fredricka Weights   11/25/23 1936 11/25/23 2209 11/26/23 1615   Weight: 116.1 kg 116.1 kg 118.1 kg   General exam: Awake, laying in bed, in nad Respiratory system: Normal respiratory effort, no wheezing Cardiovascular system: regular rate, s1, s2 Gastrointestinal system: Soft, nondistended, positive BS Central nervous system: CN2-12 grossly intact, strength intact Extremities: Perfused, no clubbing Skin: Normal skin turgor, no notable skin lesions seen Psychiatry: Mood normal // no visual hallucinations   Condition at discharge: fair  The results of significant diagnostics from this hospitalization (including imaging, microbiology, ancillary and laboratory) are listed below for reference.   Imaging Studies: CT CHEST W CONTRAST Result Date: 12/01/2023 CLINICAL DATA:  Persistent hypoxia. History of COPD. Respiratory illness. EXAM: CT CHEST WITH CONTRAST TECHNIQUE: Multidetector CT imaging of the chest was performed during intravenous contrast administration. RADIATION DOSE REDUCTION: This exam was performed according to the departmental dose-optimization program which includes automated exposure control, adjustment of the mA and/or kV according to patient size and/or use of iterative reconstruction technique. CONTRAST:  75mL OMNIPAQUE  IOHEXOL  300 MG/ML  SOLN COMPARISON:  None Available. FINDINGS: Cardiovascular: The heart size is normal. No substantial pericardial effusion. Coronary artery calcification is evident. Status post CABG. Mild atherosclerotic calcification is noted in the wall of the thoracic aorta. Mediastinum/Nodes: No mediastinal lymphadenopathy. No left hilar lymphadenopathy. 11 mm short axis right infrahilar lymph node on 95/2 is borderline enlarged. The esophagus has normal imaging features. There is no axillary lymphadenopathy. Lungs/Pleura: Peripheral airway impaction noted posterior right lower lobe (113/5) with volume loss/atelectasis peripheral to this region and dependent atelectasis in the right lower lobe. There is some dependent  atelectasis in the left lower lobe as well. No lobar consolidation. No pulmonary edema or substantial pleural effusion. Upper Abdomen: Visualized portion of the upper abdomen shows no acute findings. Musculoskeletal: No worrisome lytic or sclerotic osseous abnormality. IMPRESSION: 1. Peripheral airway impaction posterior right lower lobe with volume loss/atelectasis peripheral to this region and dependent atelectasis in the right lower lobe. Imaging features may be related to mucous plugging or aspiration. 2. Borderline enlarged right infrahilar lymph node, likely reactive. Recommend follow-up CT chest with contrast in 3 months to  ensure resolution. 3.  Aortic Atherosclerosis (ICD10-I70.0). Electronically Signed   By: Camellia Candle M.D.   On: 12/01/2023 05:13   ECHOCARDIOGRAM COMPLETE Result Date: 11/30/2023    ECHOCARDIOGRAM REPORT   Patient Name:   Jordan Hebert Date of Exam: 11/30/2023 Medical Rec #:  [id number]       Height:       69.0 in Accession #:    [id number]      Weight:       260.4 lb Date of Birth:  [birth year]      BSA:          2.311 m Patient Age:    82 years        BP:           151/85 mmHg Patient Gender: M               HR:           88 bpm. Exam Location:  Inpatient Procedure: 2D Echo, Cardiac Doppler and Color Doppler (Both Spectral and Color            Flow Doppler were utilized during procedure). Indications:    Dyspnea R06.00  History:        Patient has prior history of Echocardiogram examinations, most                 recent 01/04/2023. 23 mm Edwards valve is present in the aortic                 position. Procedure date: 2019, CAD, Prior CABG, s/p AVR,                 Signs/Symptoms:Dyspnea; Risk Factors:Dyslipidemia.                 Aortic Valve: 23 mm bioprosthetic valve is present in the aortic                 position.  Sonographer:    Koleen Popper RDCS Referring Phys: [phone] [facility]  Sonographer Comments: Image acquisition challenging due to respiratory motion. IMPRESSIONS  1.  Left ventricular ejection fraction, by estimation, is 60 to 65%. The left ventricle has normal function. The left ventricle has no regional wall motion abnormalities. There is mild left ventricular hypertrophy. Left ventricular diastolic parameters are consistent with Grade II diastolic dysfunction (pseudonormalization). Elevated left atrial pressure.  2. Right ventricular systolic function is mildly reduced. The right ventricular size is mildly enlarged. There is normal pulmonary artery systolic pressure. The estimated right ventricular systolic pressure is 25.1 mmHg.  3. The mitral valve is degenerative. Trivial mitral valve regurgitation. Moderate mitral annular calcification.  4. The aortic valve has been repaired/replaced. Aortic valve regurgitation is not visualized. There is a 23 mm bioprosthetic valve present in the aortic position. Echo findings are consistent with normal structure and function of the aortic valve prosthesis. Aortic valve mean gradient measures 8.0 mmHg.  5. The inferior vena cava is normal in size with greater than 50% respiratory variability, suggesting right atrial pressure of 3 mmHg. FINDINGS  Left Ventricle: Left ventricular ejection fraction, by estimation, is 60 to 65%. The left ventricle has normal function. The left ventricle has no regional wall motion abnormalities. The left ventricular internal cavity size was normal in size. There is  mild left ventricular hypertrophy. Left ventricular diastolic parameters are consistent with Grade II diastolic dysfunction (pseudonormalization). Elevated left atrial pressure. Right Ventricle: The right ventricular size is mildly enlarged. No increase  in right ventricular wall thickness. Right ventricular systolic function is mildly reduced. There is normal pulmonary artery systolic pressure. The tricuspid regurgitant velocity  is 2.35 m/s, and with an assumed right atrial pressure of 3 mmHg, the estimated right ventricular systolic pressure is  25.1 mmHg. Left Atrium: Left atrial size was normal in size. Right Atrium: Right atrial size was normal in size. Pericardium: There is no evidence of pericardial effusion. Mitral Valve: The mitral valve is degenerative in appearance. Moderate mitral annular calcification. Trivial mitral valve regurgitation. Tricuspid Valve: The tricuspid valve is normal in structure. Tricuspid valve regurgitation is trivial. Aortic Valve: The aortic valve has been repaired/replaced. Aortic valve regurgitation is not visualized. Aortic valve mean gradient measures 8.0 mmHg. Aortic valve peak gradient measures 16.2 mmHg. Aortic valve area, by VTI measures 1.54 cm. There is a 23 mm bioprosthetic valve present in the aortic position. Echo findings are consistent with normal structure and function of the aortic valve prosthesis. Pulmonic Valve: The pulmonic valve was not well visualized. Pulmonic valve regurgitation is not visualized. Aorta: The aortic root and ascending aorta are structurally normal, with no evidence of dilitation. Venous: The inferior vena cava is normal in size with greater than 50% respiratory variability, suggesting right atrial pressure of 3 mmHg. IAS/Shunts: The interatrial septum was not well visualized.  LEFT VENTRICLE PLAX 2D LVIDd:         5.00 cm   Diastology LVIDs:         3.20 cm   LV e' medial:    4.46 cm/s LV PW:         1.30 cm   LV E/e' medial:  30.3 LV IVS:        1.30 cm   LV e' lateral:   9.03 cm/s LVOT diam:     2.00 cm   LV E/e' lateral: 15.0 LV SV:         61 LV SV Index:   26 LVOT Area:     3.14 cm  RIGHT VENTRICLE            IVC RV S prime:     7.18 cm/s  IVC diam: 2.00 cm TAPSE (M-mode): 1.4 cm LEFT ATRIUM             Index        RIGHT ATRIUM           Index LA diam:        5.30 cm 2.29 cm/m   RA Area:     17.60 cm LA Vol (A2C):   90.4 ml 39.12 ml/m  RA Volume:   44.40 ml  19.21 ml/m LA Vol (A4C):   64.7 ml 28.00 ml/m LA Biplane Vol: 77.1 ml 33.36 ml/m  AORTIC VALVE AV Area (Vmax):     1.39 cm AV Area (Vmean):   1.40 cm AV Area (VTI):     1.54 cm AV Vmax:           201.50 cm/s AV Vmean:          134.000 cm/s AV VTI:            0.395 m AV Peak Grad:      16.2 mmHg AV Mean Grad:      8.0 mmHg LVOT Vmax:         89.20 cm/s LVOT Vmean:        59.700 cm/s LVOT VTI:          0.193 m LVOT/AV VTI ratio: 0.49  AORTA Ao Root diam: 3.30 cm Ao Asc diam:  3.20 cm MITRAL VALVE                TRICUSPID VALVE MV Area (PHT): 3.77 cm     TR Peak grad:   22.1 mmHg MV Decel Time: 201 msec     TR Vmax:        235.00 cm/s MR Peak grad: 85.0 mmHg MR Vmax:      461.00 cm/s   SHUNTS MV E velocity: 135.00 cm/s  Systemic VTI:  0.19 m MV A velocity: 114.00 cm/s  Systemic Diam: 2.00 cm MV E/A ratio:  1.18 Lonni Nanas MD Electronically signed by Lonni Nanas MD Signature Date/Time: 11/30/2023/3:27:08 PM    Final    DG Chest 2 View Result Date: 11/25/2023 CLINICAL DATA:  Shortness of breath EXAM: CHEST - 2 VIEW COMPARISON:  Chest x-ray 06/28/2022 FINDINGS: Patient is status post heart valve replacement, unchanged. The heart size and mediastinal contours are within normal limits. Both lungs are clear. The visualized skeletal structures are unremarkable. IMPRESSION: No active cardiopulmonary disease. Electronically Signed   By: Greig Pique M.D.   On: 11/25/2023 19:59    Microbiology: Results for orders placed or performed during the hospital encounter of 11/25/23  Resp panel by RT-PCR (RSV, Flu A&B, Covid) Anterior Nasal Swab     Status: None   Collection Time: 11/26/23 12:30 PM   Specimen: Anterior Nasal Swab  Result Value Ref Range Status   SARS Coronavirus 2 by RT PCR NEGATIVE NEGATIVE Final    Comment: (NOTE) SARS-CoV-2 target nucleic acids are NOT DETECTED.  The SARS-CoV-2 RNA is generally detectable in upper respiratory specimens during the acute phase of infection. The lowest concentration of SARS-CoV-2 viral copies this assay can detect is 138 copies/mL. A negative result does not  preclude SARS-Cov-2 infection and should not be used as the sole basis for treatment or other patient management decisions. A negative result may occur with  improper specimen collection/handling, submission of specimen other than nasopharyngeal swab, presence of viral mutation(s) within the areas targeted by this assay, and inadequate number of viral copies(<138 copies/mL). A negative result must be combined with clinical observations, patient history, and epidemiological information. The expected result is Negative.  Fact Sheet for Patients:  BloggerCourse.com  Fact Sheet for Healthcare Providers:  SeriousBroker.it  This test is no t yet approved or cleared by the [place]  FDA and  has been authorized for detection and/or diagnosis of SARS-CoV-2 by FDA under an Emergency Use Authorization (EUA). This EUA will remain  in effect (meaning this test can be used) for the duration of the COVID-19 declaration under Section 564(b)(1) of the Act, 21 U.S.C.section 360bbb-3(b)(1), unless the authorization is terminated  or revoked sooner.       Influenza A by PCR NEGATIVE NEGATIVE Final   Influenza B by PCR NEGATIVE NEGATIVE Final    Comment: (NOTE) The Xpert Xpress SARS-CoV-2/FLU/RSV plus assay is intended as an aid in the diagnosis of influenza from Nasopharyngeal swab specimens and should not be used as a sole basis for treatment. Nasal washings and aspirates are unacceptable for Xpert Xpress SARS-CoV-2/FLU/RSV testing.  Fact Sheet for Patients: BloggerCourse.com  Fact Sheet for Healthcare Providers: SeriousBroker.it  This test is not yet approved or cleared by the [place]  FDA and has been authorized for detection and/or diagnosis of SARS-CoV-2 by FDA under an Emergency Use Authorization (EUA). This EUA will remain in effect (meaning this test can be used)  for the  duration of the COVID-19 declaration under Section 564(b)(1) of the Act, 21 U.S.C. section 360bbb-3(b)(1), unless the authorization is terminated or revoked.     Resp Syncytial Virus by PCR NEGATIVE NEGATIVE Final    Comment: (NOTE) Fact Sheet for Patients: BloggerCourse.com  Fact Sheet for Healthcare Providers: SeriousBroker.it  This test is not yet approved or cleared by the [place]  FDA and has been authorized for detection and/or diagnosis of SARS-CoV-2 by FDA under an Emergency Use Authorization (EUA). This EUA will remain in effect (meaning this test can be used) for the duration of the COVID-19 declaration under Section 564(b)(1) of the Act, 21 U.S.C. section 360bbb-3(b)(1), unless the authorization is terminated or revoked.  Performed at [facility], [address] [place], [place] [address]   MRSA Next Gen by PCR, Nasal     Status: None   Collection Time: 11/26/23  5:01 PM   Specimen: Nasal Mucosa; Nasal Swab  Result Value Ref Range Status   MRSA by PCR Next Gen NOT DETECTED NOT DETECTED Final    Comment: (NOTE) The GeneXpert MRSA Assay (FDA approved for NASAL specimens only), is one component of a comprehensive MRSA colonization surveillance program. It is not intended to diagnose MRSA infection nor to guide or monitor treatment for MRSA infections. Test performance is not FDA approved in patients less than 39 years old. Performed at [facility], [address] [place], [place] [address]     Labs: CBC: Recent Labs  Lab 12/01/23 0313 12/03/23 0246 12/04/23 0238 12/05/23 0240 12/06/23 0258  WBC 9.5 9.4 10.5 10.4 11.7*  HGB 14.7 14.9 14.7 15.4 16.4  HCT 46.5 47.2 45.7 47.3 48.3  MCV 101.8* 101.7* 100.2* 100.2* 97.4  PLT 115* 127* 124* 132* 128*   Basic Metabolic Panel: Recent Labs  Lab 12/02/23 0304 12/03/23 0246 12/04/23 0238 12/05/23 0240  12/06/23 0348  NA 132* 131* 130* 129* 131*  K 4.8 4.7 5.2* 5.5* 5.0  CL 88* 89* 89* 88* 88*  CO2 35* 35* 33* 31 32  GLUCOSE 133* 133* 162* 160* 181*  BUN 27* 34* 31* 32* 31*  CREATININE 0.73 0.81 0.70 0.86 0.79  CALCIUM  8.9 9.1 9.2 9.5 9.5  MG 2.6*  --   --   --   --    Liver Function Tests: Recent Labs  Lab 12/02/23 0304 12/03/23 0246 12/04/23 0238 12/05/23 0240 12/06/23 0348  AST 17 24 20 27  37  ALT 34 47* 43 58* 66*  ALKPHOS 31* 32* 31* 35* 36*  BILITOT 0.6 0.6 0.6 0.8 0.8  PROT 6.1* 6.4* 6.2* 6.8 6.6  ALBUMIN  3.7 3.9 3.7 4.1 4.0   CBG: Recent Labs  Lab 12/05/23 1103 12/05/23 1618 12/05/23 2010 12/05/23 2106 12/06/23 0758  GLUCAP 186* 250* 258* 239* 138*    Discharge time spent: less than 30 minutes.  Signed: Garnette Pelt, MD [facility] 12/06/2023

## 2023-12-06 NOTE — TOC Progression Note (Signed)
 Transition of Care [facility]) - Progression Note    Patient Details  Name: Jordan Hebert MRN: [id number] Date of Birth: 1940/10/06  [facility] CM/SW Contact  [name], RN Phone Number: 12/06/2023, 11:25 AM  Clinical Narrative:    Plan to DC to [facility] for STR. Pt will need to DC with Cpap orders. Pt will need to wear Cpap during the night to have correct Cpap settings to DC to SNF. Will need to resubmit for insurance auth for SNF. IP Care Management following.     Expected Discharge Plan: Skilled Nursing Facility Barriers to Discharge: No Barriers Identified               Expected Discharge Plan and Services In-house Referral: Clinical Social Work Discharge Planning Services: NA Post Acute Care Choice: Home Health Living arrangements for the past 2 months: Single Family Home Expected Discharge Date: 12/06/23               DME Arranged: N/A DME Agency: NA       HH Arranged: NA HH Agency: NA Date HH Agency Contacted: 11/28/23 Time HH Agency Contacted: 1526 Representative spoke with at [facility] Agency: [facility]   Social Drivers of Health (SDOH) Interventions SDOH Screenings   Food Insecurity: No Food Insecurity (11/26/2023)  Housing: High Risk (11/26/2023)  Transportation Needs: No Transportation Needs (11/26/2023)  Utilities: At Risk (11/26/2023)  Physical Activity: Inactive (11/10/2017)  Social Connections: Moderately Integrated (11/26/2023)  Stress: Stress Concern Present (11/10/2017)  Tobacco Use: Medium Risk (11/25/2023)    Readmission Risk Interventions    11/28/2023    3:24 PM  Readmission Risk Prevention Plan  Post Dischage Appt Complete  Medication Screening Complete  Transportation Screening Complete

## 2023-12-07 DIAGNOSIS — I272 Pulmonary hypertension, unspecified: Secondary | ICD-10-CM | POA: Diagnosis not present

## 2023-12-07 DIAGNOSIS — R2681 Unsteadiness on feet: Secondary | ICD-10-CM | POA: Diagnosis not present

## 2023-12-07 DIAGNOSIS — R7301 Impaired fasting glucose: Secondary | ICD-10-CM | POA: Diagnosis not present

## 2023-12-07 DIAGNOSIS — I2510 Atherosclerotic heart disease of native coronary artery without angina pectoris: Secondary | ICD-10-CM | POA: Diagnosis not present

## 2023-12-07 DIAGNOSIS — Z720 Tobacco use: Secondary | ICD-10-CM | POA: Diagnosis not present

## 2023-12-07 DIAGNOSIS — J4489 Other specified chronic obstructive pulmonary disease: Secondary | ICD-10-CM | POA: Diagnosis not present

## 2023-12-07 DIAGNOSIS — L219 Seborrheic dermatitis, unspecified: Secondary | ICD-10-CM | POA: Diagnosis not present

## 2023-12-07 DIAGNOSIS — D696 Thrombocytopenia, unspecified: Secondary | ICD-10-CM | POA: Diagnosis not present

## 2023-12-07 DIAGNOSIS — M6281 Muscle weakness (generalized): Secondary | ICD-10-CM | POA: Diagnosis not present

## 2023-12-07 DIAGNOSIS — R278 Other lack of coordination: Secondary | ICD-10-CM | POA: Diagnosis not present

## 2023-12-07 DIAGNOSIS — I350 Nonrheumatic aortic (valve) stenosis: Secondary | ICD-10-CM | POA: Diagnosis not present

## 2023-12-07 DIAGNOSIS — J9622 Acute and chronic respiratory failure with hypercapnia: Secondary | ICD-10-CM | POA: Diagnosis not present

## 2023-12-07 DIAGNOSIS — Z7401 Bed confinement status: Secondary | ICD-10-CM | POA: Diagnosis not present

## 2023-12-07 DIAGNOSIS — R638 Other symptoms and signs concerning food and fluid intake: Secondary | ICD-10-CM | POA: Diagnosis not present

## 2023-12-07 DIAGNOSIS — E875 Hyperkalemia: Secondary | ICD-10-CM | POA: Diagnosis not present

## 2023-12-07 DIAGNOSIS — M549 Dorsalgia, unspecified: Secondary | ICD-10-CM | POA: Diagnosis not present

## 2023-12-07 DIAGNOSIS — I10 Essential (primary) hypertension: Secondary | ICD-10-CM | POA: Diagnosis not present

## 2023-12-07 DIAGNOSIS — J441 Chronic obstructive pulmonary disease with (acute) exacerbation: Secondary | ICD-10-CM | POA: Diagnosis not present

## 2023-12-07 DIAGNOSIS — Z743 Need for continuous supervision: Secondary | ICD-10-CM | POA: Diagnosis not present

## 2023-12-07 DIAGNOSIS — M1000 Idiopathic gout, unspecified site: Secondary | ICD-10-CM | POA: Diagnosis not present

## 2023-12-07 DIAGNOSIS — R531 Weakness: Secondary | ICD-10-CM | POA: Diagnosis not present

## 2023-12-07 DIAGNOSIS — E785 Hyperlipidemia, unspecified: Secondary | ICD-10-CM | POA: Diagnosis not present

## 2023-12-07 DIAGNOSIS — E039 Hypothyroidism, unspecified: Secondary | ICD-10-CM | POA: Diagnosis not present

## 2023-12-07 DIAGNOSIS — E119 Type 2 diabetes mellitus without complications: Secondary | ICD-10-CM | POA: Diagnosis not present

## 2023-12-07 DIAGNOSIS — N62 Hypertrophy of breast: Secondary | ICD-10-CM | POA: Diagnosis not present

## 2023-12-07 DIAGNOSIS — I459 Conduction disorder, unspecified: Secondary | ICD-10-CM | POA: Diagnosis not present

## 2023-12-07 DIAGNOSIS — E7800 Pure hypercholesterolemia, unspecified: Secondary | ICD-10-CM | POA: Diagnosis not present

## 2023-12-07 LAB — CBC
HCT: 44.9 % (ref 39.0–52.0)
Hemoglobin: 14.5 g/dL (ref 13.0–17.0)
MCH: 31.9 pg (ref 26.0–34.0)
MCHC: 32.3 g/dL (ref 30.0–36.0)
MCV: 98.7 fL (ref 80.0–100.0)
Platelets: 128 K/uL — ABNORMAL LOW (ref 150–400)
RBC: 4.55 MIL/uL (ref 4.22–5.81)
RDW: 13.8 % (ref 11.5–15.5)
WBC: 11.1 K/uL — ABNORMAL HIGH (ref 4.0–10.5)
nRBC: 0 % (ref 0.0–0.2)

## 2023-12-07 LAB — GLUCOSE, CAPILLARY
Glucose-Capillary: 130 mg/dL — ABNORMAL HIGH (ref 70–99)
Glucose-Capillary: 244 mg/dL — ABNORMAL HIGH (ref 70–99)

## 2023-12-07 LAB — COMPREHENSIVE METABOLIC PANEL WITH GFR
ALT: 64 U/L — ABNORMAL HIGH (ref 0–44)
AST: 33 U/L (ref 15–41)
Albumin: 3.6 g/dL (ref 3.5–5.0)
Alkaline Phosphatase: 33 U/L — ABNORMAL LOW (ref 38–126)
Anion gap: 11 (ref 5–15)
BUN: 33 mg/dL — ABNORMAL HIGH (ref 8–23)
CO2: 29 mmol/L (ref 22–32)
Calcium: 8.9 mg/dL (ref 8.9–10.3)
Chloride: 91 mmol/L — ABNORMAL LOW (ref 98–111)
Creatinine, Ser: 0.89 mg/dL (ref 0.61–1.24)
GFR, Estimated: >60 mL/min (ref >60)
Glucose, Bld: 150 mg/dL — ABNORMAL HIGH (ref 70–99)
Potassium: 4.8 mmol/L (ref 3.5–5.1)
Sodium: 131 mmol/L — ABNORMAL LOW (ref 135–145)
Total Bilirubin: 0.7 mg/dL (ref 0.0–1.2)
Total Protein: 5.9 g/dL — ABNORMAL LOW (ref 6.5–8.1)

## 2023-12-07 NOTE — Plan of Care (Signed)
  Problem: Education: Goal: Knowledge of General Education information will improve Description: Including pain rating scale, medication(s)/side effects and non-pharmacologic comfort measures Outcome: Progressing   Problem: Health Behavior/Discharge Planning: Goal: Ability to manage health-related needs will improve Outcome: Progressing   Problem: Clinical Measurements: Goal: Ability to maintain clinical measurements within normal limits will improve Outcome: Progressing Goal: Will remain free from infection Outcome: Progressing Goal: Diagnostic test results will improve Outcome: Progressing   Problem: Nutrition: Goal: Adequate nutrition will be maintained Outcome: Progressing   Problem: Clinical Measurements: Goal: Respiratory complications will improve Outcome: Adequate for Discharge

## 2023-12-07 NOTE — Progress Notes (Signed)
 Report given to [facility]

## 2023-12-07 NOTE — Discharge Summary (Signed)
 Physician Discharge Summary  Jordan Hebert FMW:[id number] DOB: [birth year] DOA: 11/25/2023  PCP: Stacia Millman, PA  Admit date: 11/25/2023 Discharge date: 12/07/2023  Admitted From: Home Disposition: [facility]  Recommendations for Outpatient Follow-up:  Follow up with PCP in 1-2 weeks Ambulatory referral placed to pulmonology for further management of BiPAP, potential sleep apnea, COPD Continue BIPAP at night Please monitor potassium levels daily for the next 5 days.  Follow AC HS glucose checks. Likely wean insulin  off as steroids are being tapered down Recommend repeat BMP 1 week to assess electrolytes  Equipment/Devices: BiPAP  Discharge Condition: Able CODE STATUS: Full code Diet recommendation: Heart healthy diet  History of present illness:  Jordan Hebert is a 83 year old man PMH COPD, CAD s/p CABG, aortic valve stenosis, gout, hyperlipidemia, hypothyroidism, hypertension, chronic thrombocytopenia, hearing impairment, and tobacco use disorder presented to [facility] with reports of increased work of breath, wheezing which has been progressively getting worse. Patient was hospitalized for further management of COPD exacerbation.   Hospital course:  COPD with acute exacerbation Acute on chronic respiratory failure with hypoxia and hypercapnia Patient presenting with progressive shortness of breath, wheezing.  COVID-19, RSV, influenza PCR negative.  CT chest with findings consistent with mucous plugging.  TTE with normal LVEF and mildly reduced RV function.  Initially started on scheduled IV steroids and neb treatments with improvement of symptoms.  Completed course of doxycycline  on 12/04/2023.  IV steroids de-escalated to prednisone  taper.  Continue supplemental oxygen , goal SpO2 greater than 88%.  Continue BiPAP nocturnally.  Outpatient follow-up with pulmonology, referral placed.  Patient requires noninvasive ventilation due to COPD consequent to chronic respiratory  failure. OSA is not the cause of the patient's hypercapnia. Bilevel has been ruled out as insufficient due to lack of backup batteries, lack of alarms, and lack of guaranteed pressure support. Patient has required ventilatory support with higher pressure support to lower his pCO2 levels as seen in ABG results. Without noninvasive ventilation, the patient will likely be readmitted to the hospital for an exacerbation, experience risk of harm or death.    Coronary artery disease status post CABG Cardiac status is stable.  Continue aspirin  Plavix .  He is noted to be on metoprolol .  Recently noted to be bradycardic.  Metoprolol  was discontinued.  Heart rate has improved.  Continue statin.    Hyperkalemia Reason for this is not entirely clear.  Improved after he was given Lokelma .  Could be related to steroids.  Recommend repeat BMP 1 week.   Hyperglycemia Likely secondary to steroids.  Hemoglobin A1c 6.3.  Continue glargine but anticipate CBG's to improve as steroid is tapered off. Continue to follow AC HS at facility, Likely wean insulin  to off as tolerated   Chronic thrombocytopenia Stable.   Tobacco use disorder Counseled.   Class II obesity Body mass index is 38.45 kg/m. Recommend diet/lifestyle modification  Discharge Diagnoses:  Principal Problem:   COPD with acute exacerbation [facility])    Discharge Instructions  Discharge Instructions     Call MD for:  difficulty breathing, headache or visual disturbances   Complete by: As directed    Call MD for:  extreme fatigue   Complete by: As directed    Call MD for:  persistant dizziness or light-headedness   Complete by: As directed    Call MD for:  persistant nausea and vomiting   Complete by: As directed    Call MD for:  severe uncontrolled pain   Complete by: As directed  Call MD for:  temperature >100.4   Complete by: As directed    Diet - low sodium heart healthy   Complete by: As directed    Increase activity slowly    Complete by: As directed    Pulmonary Visit   Complete by: As directed    COPD, hypercapnea. Being sent home with bipap.   Reason for referral: Pulmonary + Sleep      Allergies as of 12/07/2023       Reactions   Viagra [sildenafil]         Medication List     STOP taking these medications    metoprolol  tartrate 25 MG tablet Commonly known as: LOPRESSOR        TAKE these medications    amLODipine  5 MG tablet Commonly known as: NORVASC  Take 1 tablet (5 mg total) by mouth daily.   aspirin  EC 81 MG tablet Take 1 tablet (81 mg total) by mouth daily.   budesonide  0.25 MG/2ML nebulizer solution Commonly known as: PULMICORT  Take 2 mLs (0.25 mg total) by nebulization 2 (two) times daily.   clopidogrel  75 MG tablet Commonly known as: PLAVIX  Take 1 tablet by mouth daily.   cyanocobalamin  1000 MCG tablet Commonly known as: VITAMIN B12 Take 1,000 mcg by mouth daily.   docusate sodium  100 MG capsule Commonly known as: COLACE Take 1 capsule (100 mg total) by mouth 2 (two) times daily.   guaiFENesin  600 MG 12 hr tablet Commonly known as: MUCINEX  Take 2 tablets (1,200 mg total) by mouth 2 (two) times daily.   insulin  glargine 100 UNIT/ML injection Commonly known as: LANTUS  Inject 0.1 mLs (10 Units total) into the skin daily.   insulin  lispro 100 UNIT/ML KwikPen Commonly known as: HumaLOG  KwikPen Sliding scale AC HS CBG 70 - 120: 0 units  CBG 121 - 150: 2 units  CBG 151 - 200: 3 units  CBG 201 - 250: 5 units  CBG 251 - 300: 8 units  CBG 301 - 350: 11 units  CBG 351 - 400: 15 units  CBG > 400: call MD   ipratropium-albuterol  0.5-2.5 (3) MG/3ML Soln Commonly known as: DUONEB Take 3 mLs by nebulization 3 (three) times daily.   ketoconazole  2 % cream Commonly known as: NIZORAL  Apply topically 2 (two) times daily for 6 days.   levothyroxine  150 MCG tablet Commonly known as: SYNTHROID  Take 150 mcg by mouth daily before breakfast.   Lokelma  10 g Pack  packet Generic drug: sodium zirconium cyclosilicate  Take 10 g by mouth 2 (two) times daily for 3 days.   melatonin 5 MG Tabs Take 1 tablet (5 mg total) by mouth at bedtime as needed.   nicotine  14 mg/24hr patch Commonly known as: NICODERM CQ  - dosed in mg/24 hours Place 1 patch (14 mg total) onto the skin daily.   nicotine  polacrilex 2 MG gum Commonly known as: NICORETTE  Take 1 each (2 mg total) by mouth as needed for smoking cessation.   nystatin  powder Commonly known as: MYCOSTATIN /NYSTOP  Apply topically 3 (three) times daily for 6 days.   predniSONE  10 MG tablet Commonly known as: DELTASONE  Take 6 tablets (60 mg total) by mouth daily for 2 days, THEN 4 tablets (40 mg total) daily for 2 days, THEN 2 tablets (20 mg total) daily for 2 days, THEN 1 tablet (10 mg total) daily for 2 days, THEN 0.5 tablets (5 mg total) daily for 2 days. Start taking on: December 06, 2023   rosuvastatin  40 MG tablet Commonly  known as: CRESTOR  Take 1 tablet (40 mg total) by mouth daily.   sertraline  50 MG tablet Commonly known as: ZOLOFT  Take 50 mg by mouth daily.               Durable Medical Equipment  (From admission, onward)           Start     Ordered   11/29/23 0829  For home use only DME Bipap  Once       Question Answer Comment  Length of Need Lifetime   Bleed in oxygen  (LPM) 2   Keep O2 saturation between 88-92   Inspiratory pressure 20   Expiratory pressure 8      11/29/23 0828   11/29/23 0825  For home use only DME oxygen   Once       Question Answer Comment  Length of Need Lifetime   Mode or (Route) Nasal cannula   Liters per Minute 2   Frequency Continuous (stationary and portable oxygen  unit needed)   Oxygen  conserving device Yes   Oxygen  delivery system Gas      11/29/23 0826            Contact information for follow-up providers     [facility], [facility]  Follow up.   Why: Please continue home health PT services with this provider  upon discharge. Contact information: [address] [place] [address] [phone]         [facility], [facility] Follow up.   Why: Your home Oxygen  and Bipap will be delivered through [facility]. Contact information: [address] [place] [address] [phone]         Cunningham, Alexus, PA. Schedule an appointment as soon as possible for a visit in 1 week(s).   Specialty: Physician Assistant Contact information: [address] [place] [address] [phone]              Contact information for after-discharge care     Destination     [facility] .   Service: Skilled Nursing Contact information: [address] [place]  [address] [phone]                    Allergies  Allergen Reactions   Viagra [Sildenafil]     Consultations: none   Procedures/Studies: CT CHEST W CONTRAST Result Date: 12/01/2023 CLINICAL DATA:  Persistent hypoxia. History of COPD. Respiratory illness. EXAM: CT CHEST WITH CONTRAST TECHNIQUE: Multidetector CT imaging of the chest was performed during intravenous contrast administration. RADIATION DOSE REDUCTION: This exam was performed according to the departmental dose-optimization program which includes automated exposure control, adjustment of the mA and/or kV according to patient size and/or use of iterative reconstruction technique. CONTRAST:  75mL OMNIPAQUE  IOHEXOL  300 MG/ML  SOLN COMPARISON:  None Available. FINDINGS: Cardiovascular: The heart size is normal. No substantial pericardial effusion. Coronary artery calcification is evident. Status post CABG. Mild atherosclerotic calcification is noted in the wall of the thoracic aorta. Mediastinum/Nodes: No mediastinal lymphadenopathy. No left hilar lymphadenopathy. 11 mm short axis right infrahilar lymph node on 95/2 is borderline enlarged. The esophagus has normal imaging features. There is no axillary  lymphadenopathy. Lungs/Pleura: Peripheral airway impaction noted posterior right lower lobe (113/5) with volume loss/atelectasis peripheral to this region and dependent atelectasis in the right lower lobe. There is some dependent atelectasis in the left lower lobe as well. No lobar consolidation. No pulmonary edema or substantial pleural effusion. Upper  Abdomen: Visualized portion of the upper abdomen shows no acute findings. Musculoskeletal: No worrisome lytic or sclerotic osseous abnormality. IMPRESSION: 1. Peripheral airway impaction posterior right lower lobe with volume loss/atelectasis peripheral to this region and dependent atelectasis in the right lower lobe. Imaging features may be related to mucous plugging or aspiration. 2. Borderline enlarged right infrahilar lymph node, likely reactive. Recommend follow-up CT chest with contrast in 3 months to ensure resolution. 3.  Aortic Atherosclerosis (ICD10-I70.0). Electronically Signed   By: Camellia Candle M.D.   On: 12/01/2023 05:13   ECHOCARDIOGRAM COMPLETE Result Date: 11/30/2023    ECHOCARDIOGRAM REPORT   Patient Name:   Jordan Hebert Date of Exam: 11/30/2023 Medical Rec #:  [id number]       Height:       69.0 in Accession #:    [id number]      Weight:       260.4 lb Date of Birth:  07-Oct-1940      BSA:          2.311 m Patient Age:    82 years        BP:           151/85 mmHg Patient Gender: M               HR:           88 bpm. Exam Location:  Inpatient Procedure: 2D Echo, Cardiac Doppler and Color Doppler (Both Spectral and Color            Flow Doppler were utilized during procedure). Indications:    Dyspnea R06.00  History:        Patient has prior history of Echocardiogram examinations, most                 recent 01/04/2023. 23 mm Edwards valve is present in the aortic                 position. Procedure date: 2019, CAD, Prior CABG, s/p AVR,                 Signs/Symptoms:Dyspnea; Risk Factors:Dyslipidemia.                 Aortic Valve: 23 mm  bioprosthetic valve is present in the aortic                 position.  Sonographer:    Koleen Popper RDCS Referring Phys: [phone] [facility]  Sonographer Comments: Image acquisition challenging due to respiratory motion. IMPRESSIONS  1. Left ventricular ejection fraction, by estimation, is 60 to 65%. The left ventricle has normal function. The left ventricle has no regional wall motion abnormalities. There is mild left ventricular hypertrophy. Left ventricular diastolic parameters are consistent with Grade II diastolic dysfunction (pseudonormalization). Elevated left atrial pressure.  2. Right ventricular systolic function is mildly reduced. The right ventricular size is mildly enlarged. There is normal pulmonary artery systolic pressure. The estimated right ventricular systolic pressure is 25.1 mmHg.  3. The mitral valve is degenerative. Trivial mitral valve regurgitation. Moderate mitral annular calcification.  4. The aortic valve has been repaired/replaced. Aortic valve regurgitation is not visualized. There is a 23 mm bioprosthetic valve present in the aortic position. Echo findings are consistent with normal structure and function of the aortic valve prosthesis. Aortic valve mean gradient measures 8.0 mmHg.  5. The inferior vena cava is normal in size with greater than 50% respiratory variability, suggesting right atrial pressure of 3 mmHg. FINDINGS  Left Ventricle: Left ventricular ejection fraction, by estimation, is 60 to 65%. The left ventricle has normal function. The left ventricle has no regional wall motion abnormalities. The left ventricular internal cavity size was normal in size. There is  mild left ventricular hypertrophy. Left ventricular diastolic parameters are consistent with Grade II diastolic dysfunction (pseudonormalization). Elevated left atrial pressure. Right Ventricle: The right ventricular size is mildly enlarged. No increase in right ventricular wall thickness. Right ventricular  systolic function is mildly reduced. There is normal pulmonary artery systolic pressure. The tricuspid regurgitant velocity  is 2.35 m/s, and with an assumed right atrial pressure of 3 mmHg, the estimated right ventricular systolic pressure is 25.1 mmHg. Left Atrium: Left atrial size was normal in size. Right Atrium: Right atrial size was normal in size. Pericardium: There is no evidence of pericardial effusion. Mitral Valve: The mitral valve is degenerative in appearance. Moderate mitral annular calcification. Trivial mitral valve regurgitation. Tricuspid Valve: The tricuspid valve is normal in structure. Tricuspid valve regurgitation is trivial. Aortic Valve: The aortic valve has been repaired/replaced. Aortic valve regurgitation is not visualized. Aortic valve mean gradient measures 8.0 mmHg. Aortic valve peak gradient measures 16.2 mmHg. Aortic valve area, by VTI measures 1.54 cm. There is a 23 mm bioprosthetic valve present in the aortic position. Echo findings are consistent with normal structure and function of the aortic valve prosthesis. Pulmonic Valve: The pulmonic valve was not well visualized. Pulmonic valve regurgitation is not visualized. Aorta: The aortic root and ascending aorta are structurally normal, with no evidence of dilitation. Venous: The inferior vena cava is normal in size with greater than 50% respiratory variability, suggesting right atrial pressure of 3 mmHg. IAS/Shunts: The interatrial septum was not well visualized.  LEFT VENTRICLE PLAX 2D LVIDd:         5.00 cm   Diastology LVIDs:         3.20 cm   LV e' medial:    4.46 cm/s LV PW:         1.30 cm   LV E/e' medial:  30.3 LV IVS:        1.30 cm   LV e' lateral:   9.03 cm/s LVOT diam:     2.00 cm   LV E/e' lateral: 15.0 LV SV:         61 LV SV Index:   26 LVOT Area:     3.14 cm  RIGHT VENTRICLE            IVC RV S prime:     7.18 cm/s  IVC diam: 2.00 cm TAPSE (M-mode): 1.4 cm LEFT ATRIUM             Index        RIGHT ATRIUM            Index LA diam:        5.30 cm 2.29 cm/m   RA Area:     17.60 cm LA Vol (A2C):   90.4 ml 39.12 ml/m  RA Volume:   44.40 ml  19.21 ml/m LA Vol (A4C):   64.7 ml 28.00 ml/m LA Biplane Vol: 77.1 ml 33.36 ml/m  AORTIC VALVE AV Area (Vmax):    1.39 cm AV Area (Vmean):   1.40 cm AV Area (VTI):     1.54 cm AV Vmax:           201.50 cm/s AV Vmean:          134.000 cm/s AV VTI:  0.395 m AV Peak Grad:      16.2 mmHg AV Mean Grad:      8.0 mmHg LVOT Vmax:         89.20 cm/s LVOT Vmean:        59.700 cm/s LVOT VTI:          0.193 m LVOT/AV VTI ratio: 0.49  AORTA Ao Root diam: 3.30 cm Ao Asc diam:  3.20 cm MITRAL VALVE                TRICUSPID VALVE MV Area (PHT): 3.77 cm     TR Peak grad:   22.1 mmHg MV Decel Time: 201 msec     TR Vmax:        235.00 cm/s MR Peak grad: 85.0 mmHg MR Vmax:      461.00 cm/s   SHUNTS MV E velocity: 135.00 cm/s  Systemic VTI:  0.19 m MV A velocity: 114.00 cm/s  Systemic Diam: 2.00 cm MV E/A ratio:  1.18 Lonni Nanas MD Electronically signed by Lonni Nanas MD Signature Date/Time: 11/30/2023/3:27:08 PM    Final    DG Chest 2 View Result Date: 11/25/2023 CLINICAL DATA:  Shortness of breath EXAM: CHEST - 2 VIEW COMPARISON:  Chest x-ray 06/28/2022 FINDINGS: Patient is status post heart valve replacement, unchanged. The heart size and mediastinal contours are within normal limits. Both lungs are clear. The visualized skeletal structures are unremarkable. IMPRESSION: No active cardiopulmonary disease. Electronically Signed   By: Greig Pique M.D.   On: 11/25/2023 19:59     Subjective: Patient seen examined bedside, lying in bed.  No complaints this morning.  Discharging to SNF as they have been obtained a BiPAP device for his use.  No other questions or concerns at this time.  Denies headache, no dizziness, no chest pain, no palpitations, no shortness of breath more than his typical baseline, no abdominal pain, no fever/chills/night sweats, no  nausea/vomit/diarrhea, no focal weakness, no fatigue, no paresthesia.  No acute events overnight per nursing staff.  Discharge Exam: Vitals:   12/07/23 0826 12/07/23 0900  BP: 125/68   Pulse:    Resp:    Temp:  97.9 F (36.6 C)  SpO2:     Vitals:   12/07/23 0733 12/07/23 0738 12/07/23 0826 12/07/23 0900  BP:   125/68   Pulse: 62     Resp: 14     Temp:  (!) 96.9 F (36.1 C)  97.9 F (36.6 C)  TempSrc:  Axillary  Oral  SpO2: 96% 98%    Weight:      Height:        Physical Exam: GEN: NAD, alert and oriented x 3, obese, chronically ill in appearance HEENT: NCAT, PERRL, EOMI, sclera clear, MMM PULM: Slight late expiratory wheezing bilateral lower lung fields, no crackles, normal respiratory effort without accessory muscle use, on 2 L nasal cannula with SpO2 96% at rest CV: RRR w/o M/G/R GI: abd soft, NTND, + BS MSK: Trace-1+ pitting edema bilateral lower extremities to mid shin with compression socks in place, moves all extremities independently NEURO: No focal neurological deficit PSYCH: normal mood/affect Integumentary: No concerning rashes/lesions/wounds noted on exposed skin surfaces    The results of significant diagnostics from this hospitalization (including imaging, microbiology, ancillary and laboratory) are listed below for reference.     Microbiology: No results found for this or any previous visit (from the past 240 hours).   Labs: BNP (last 3 results) No results for input(s): BNP  in the last 8760 hours. Basic Metabolic Panel: Recent Labs  Lab 12/02/23 0304 12/03/23 0246 12/04/23 0238 12/05/23 0240 12/06/23 0348 12/07/23 0253  NA 132* 131* 130* 129* 131* 131*  K 4.8 4.7 5.2* 5.5* 5.0 4.8  CL 88* 89* 89* 88* 88* 91*  CO2 35* 35* 33* 31 32 29  GLUCOSE 133* 133* 162* 160* 181* 150*  BUN 27* 34* 31* 32* 31* 33*  CREATININE 0.73 0.81 0.70 0.86 0.79 0.89  CALCIUM  8.9 9.1 9.2 9.5 9.5 8.9  MG 2.6*  --   --   --   --   --    Liver Function  Tests: Recent Labs  Lab 12/03/23 0246 12/04/23 0238 12/05/23 0240 12/06/23 0348 12/07/23 0253  AST 24 20 27  37 33  ALT 47* 43 58* 66* 64*  ALKPHOS 32* 31* 35* 36* 33*  BILITOT 0.6 0.6 0.8 0.8 0.7  PROT 6.4* 6.2* 6.8 6.6 5.9*  ALBUMIN  3.9 3.7 4.1 4.0 3.6   No results for input(s): LIPASE, AMYLASE in the last 168 hours. No results for input(s): AMMONIA in the last 168 hours. CBC: Recent Labs  Lab 12/03/23 0246 12/04/23 0238 12/05/23 0240 12/06/23 0258 12/07/23 0253  WBC 9.4 10.5 10.4 11.7* 11.1*  HGB 14.9 14.7 15.4 16.4 14.5  HCT 47.2 45.7 47.3 48.3 44.9  MCV 101.7* 100.2* 100.2* 97.4 98.7  PLT 127* 124* 132* 128* 128*   Cardiac Enzymes: No results for input(s): CKTOTAL, CKMB, CKMBINDEX, TROPONINI in the last 168 hours. BNP: Invalid input(s): POCBNP CBG: Recent Labs  Lab 12/06/23 1203 12/06/23 1431 12/06/23 1701 12/06/23 2157 12/07/23 0736  GLUCAP 155* 194* 203* 206* 130*   D-Dimer No results for input(s): DDIMER in the last 72 hours. Hgb A1c No results for input(s): HGBA1C in the last 72 hours. Lipid Profile No results for input(s): CHOL, HDL, LDLCALC, TRIG, CHOLHDL, LDLDIRECT in the last 72 hours. Thyroid  function studies No results for input(s): TSH, T4TOTAL, T3FREE, THYROIDAB in the last 72 hours.  Invalid input(s): FREET3 Anemia work up No results for input(s): VITAMINB12, FOLATE, FERRITIN, TIBC, IRON, RETICCTPCT in the last 72 hours. Urinalysis    Component Value Date/Time   COLORURINE YELLOW 08/16/2017 2120   APPEARANCEUR CLEAR 08/16/2017 2120   LABSPEC 1.016 08/16/2017 2120   PHURINE 6.0 08/16/2017 2120   GLUCOSEU NEGATIVE 08/16/2017 2120   HGBUR NEGATIVE 08/16/2017 2120   BILIRUBINUR NEGATIVE 08/16/2017 2120   KETONESUR NEGATIVE 08/16/2017 2120   PROTEINUR NEGATIVE 08/16/2017 2120   NITRITE NEGATIVE 08/16/2017 2120   LEUKOCYTESUR NEGATIVE 08/16/2017 2120   Sepsis Labs Recent Labs   Lab 12/04/23 0238 12/05/23 0240 12/06/23 0258 12/07/23 0253  WBC 10.5 10.4 11.7* 11.1*   Microbiology No results found for this or any previous visit (from the past 240 hours).   Time coordinating discharge: Over 30 minutes  SIGNED:   Camellia PARAS [place], DO  [facility] 12/07/2023, 10:26 AM

## 2023-12-07 NOTE — Progress Notes (Signed)
   12/07/23 0145  BiPAP/CPAP/SIPAP  BiPAP/CPAP/SIPAP Pt Type Adult  BiPAP/CPAP/SIPAP V60  Mask Type Full face mask  Mask Size Extra large  Set Rate 18 breaths/min  Respiratory Rate 22 breaths/min  IPAP 18 cmH20  EPAP 8 cmH2O  FiO2 (%) 35 %  Flow Rate 0 lpm  Minute Ventilation 11.6  Leak 9  Peak Inspiratory Pressure (PIP) 17  Tidal Volume (Vt) 518  Patient Home Machine No  Patient Home Mask No  Patient Home Tubing No  Auto Titrate No  Press High Alarm 30 cmH2O  Press Low Alarm 5 cmH2O  Device Plugged into [employer] Power Outlet Yes

## 2023-12-07 NOTE — TOC Transition Note (Signed)
 Transition of Care [facility]) - Discharge Note   Patient Details  Name: Jordan Hebert MRN: [id number] Date of Birth: 07-Dec-1940  Transition of Care [facility]) CM/SW Contact:  [name], RN Phone Number: 12/07/2023, 11:13 AM   Clinical Narrative:    Pt will discharge to [facility] for STR. Pt and pt daughter [name] notified via telephone at [phone], and are agreeable to the DC plan. Pt will transport via [facility]. [facility] called at 1101. DC packet placed at RN station. Floor Nurse given number to call report to [phone] to room 610B. There are no other IP Care Management needs at this time.   Final next level of care: Skilled Nursing Facility Barriers to Discharge: No Barriers Identified   Patient Goals and CMS Choice Patient states their goals for this hospitalization and ongoing recovery are:: To go to [facility] for STR and the return home once stronger CMS Medicare.gov Compare Post Acute Care list provided to:: Patient Choice offered to / list presented to : Patient Lexa ownership interest in [facility].provided to:: Patient    Discharge Placement              Patient chooses bed at: [facility] Patient to be transferred to facility by: [facility] Name of family member notified: Teondre, Jarosz (Daughter) [phone] Patient and family notified of of transfer: 12/07/23  Discharge Plan and Services Additional resources added to the After Visit Summary for   In-house Referral: Clinical Social Work Discharge Planning Services: NA Post Acute Care Choice: Home Health          DME Arranged: N/A DME Agency: NA       HH Arranged: NA HH Agency: NA Date HH Agency Contacted: 11/28/23 Time HH Agency Contacted: 1526 Representative spoke with at [facility] Agency: [facility]  Social Drivers of Health (SDOH) Interventions SDOH Screenings   Food Insecurity: No Food Insecurity (11/26/2023)  Housing: High Risk (11/26/2023)  Transportation Needs: No Transportation Needs  (11/26/2023)  Utilities: At Risk (11/26/2023)  Physical Activity: Inactive (11/10/2017)  Social Connections: Moderately Integrated (11/26/2023)  Stress: Stress Concern Present (11/10/2017)  Tobacco Use: Medium Risk (11/25/2023)     Readmission Risk Interventions    11/28/2023    3:24 PM  Readmission Risk Prevention Plan  Post Dischage Appt Complete  Medication Screening Complete  Transportation Screening Complete

## 2023-12-08 DIAGNOSIS — E119 Type 2 diabetes mellitus without complications: Secondary | ICD-10-CM | POA: Diagnosis not present

## 2023-12-08 DIAGNOSIS — E039 Hypothyroidism, unspecified: Secondary | ICD-10-CM | POA: Diagnosis not present

## 2023-12-08 DIAGNOSIS — L219 Seborrheic dermatitis, unspecified: Secondary | ICD-10-CM | POA: Diagnosis not present

## 2023-12-08 DIAGNOSIS — J4489 Other specified chronic obstructive pulmonary disease: Secondary | ICD-10-CM | POA: Diagnosis not present

## 2023-12-08 DIAGNOSIS — M549 Dorsalgia, unspecified: Secondary | ICD-10-CM | POA: Diagnosis not present

## 2023-12-08 DIAGNOSIS — E785 Hyperlipidemia, unspecified: Secondary | ICD-10-CM | POA: Diagnosis not present

## 2023-12-23 DIAGNOSIS — E785 Hyperlipidemia, unspecified: Secondary | ICD-10-CM | POA: Diagnosis not present

## 2023-12-23 DIAGNOSIS — E119 Type 2 diabetes mellitus without complications: Secondary | ICD-10-CM | POA: Diagnosis not present

## 2023-12-23 DIAGNOSIS — I2510 Atherosclerotic heart disease of native coronary artery without angina pectoris: Secondary | ICD-10-CM | POA: Diagnosis not present

## 2023-12-23 DIAGNOSIS — J441 Chronic obstructive pulmonary disease with (acute) exacerbation: Secondary | ICD-10-CM | POA: Diagnosis not present

## 2023-12-23 DIAGNOSIS — E039 Hypothyroidism, unspecified: Secondary | ICD-10-CM | POA: Diagnosis not present

## 2023-12-26 DIAGNOSIS — M1000 Idiopathic gout, unspecified site: Secondary | ICD-10-CM | POA: Diagnosis not present

## 2023-12-26 DIAGNOSIS — I2510 Atherosclerotic heart disease of native coronary artery without angina pectoris: Secondary | ICD-10-CM | POA: Diagnosis not present

## 2023-12-26 DIAGNOSIS — Z9981 Dependence on supplemental oxygen: Secondary | ICD-10-CM | POA: Diagnosis not present

## 2023-12-26 DIAGNOSIS — Z951 Presence of aortocoronary bypass graft: Secondary | ICD-10-CM | POA: Diagnosis not present

## 2023-12-26 DIAGNOSIS — E7800 Pure hypercholesterolemia, unspecified: Secondary | ICD-10-CM | POA: Diagnosis not present

## 2023-12-26 DIAGNOSIS — E875 Hyperkalemia: Secondary | ICD-10-CM | POA: Diagnosis not present

## 2023-12-26 DIAGNOSIS — R7303 Prediabetes: Secondary | ICD-10-CM | POA: Diagnosis not present

## 2023-12-26 DIAGNOSIS — J441 Chronic obstructive pulmonary disease with (acute) exacerbation: Secondary | ICD-10-CM | POA: Diagnosis not present

## 2023-12-26 DIAGNOSIS — Z7902 Long term (current) use of antithrombotics/antiplatelets: Secondary | ICD-10-CM | POA: Diagnosis not present

## 2023-12-26 DIAGNOSIS — F17210 Nicotine dependence, cigarettes, uncomplicated: Secondary | ICD-10-CM | POA: Diagnosis not present

## 2023-12-26 DIAGNOSIS — K5900 Constipation, unspecified: Secondary | ICD-10-CM | POA: Diagnosis not present

## 2023-12-26 DIAGNOSIS — E1142 Type 2 diabetes mellitus with diabetic polyneuropathy: Secondary | ICD-10-CM | POA: Diagnosis not present

## 2023-12-26 DIAGNOSIS — I10 Essential (primary) hypertension: Secondary | ICD-10-CM | POA: Diagnosis not present

## 2023-12-26 DIAGNOSIS — E039 Hypothyroidism, unspecified: Secondary | ICD-10-CM | POA: Diagnosis not present

## 2023-12-26 DIAGNOSIS — Z7982 Long term (current) use of aspirin: Secondary | ICD-10-CM | POA: Diagnosis not present

## 2023-12-26 DIAGNOSIS — Z952 Presence of prosthetic heart valve: Secondary | ICD-10-CM | POA: Diagnosis not present

## 2023-12-26 DIAGNOSIS — L219 Seborrheic dermatitis, unspecified: Secondary | ICD-10-CM | POA: Diagnosis not present

## 2023-12-26 DIAGNOSIS — I459 Conduction disorder, unspecified: Secondary | ICD-10-CM | POA: Diagnosis not present

## 2023-12-27 DIAGNOSIS — I350 Nonrheumatic aortic (valve) stenosis: Secondary | ICD-10-CM | POA: Diagnosis not present

## 2023-12-27 DIAGNOSIS — M170 Bilateral primary osteoarthritis of knee: Secondary | ICD-10-CM | POA: Diagnosis not present

## 2023-12-27 DIAGNOSIS — I2510 Atherosclerotic heart disease of native coronary artery without angina pectoris: Secondary | ICD-10-CM | POA: Diagnosis not present

## 2023-12-28 DIAGNOSIS — E875 Hyperkalemia: Secondary | ICD-10-CM | POA: Diagnosis not present

## 2023-12-30 DIAGNOSIS — E039 Hypothyroidism, unspecified: Secondary | ICD-10-CM | POA: Diagnosis not present

## 2023-12-30 DIAGNOSIS — I2510 Atherosclerotic heart disease of native coronary artery without angina pectoris: Secondary | ICD-10-CM | POA: Diagnosis not present

## 2023-12-30 DIAGNOSIS — Z7982 Long term (current) use of aspirin: Secondary | ICD-10-CM | POA: Diagnosis not present

## 2023-12-30 DIAGNOSIS — E875 Hyperkalemia: Secondary | ICD-10-CM | POA: Diagnosis not present

## 2023-12-30 DIAGNOSIS — I10 Essential (primary) hypertension: Secondary | ICD-10-CM | POA: Diagnosis not present

## 2023-12-30 DIAGNOSIS — E7800 Pure hypercholesterolemia, unspecified: Secondary | ICD-10-CM | POA: Diagnosis not present

## 2023-12-30 DIAGNOSIS — I459 Conduction disorder, unspecified: Secondary | ICD-10-CM | POA: Diagnosis not present

## 2023-12-30 DIAGNOSIS — Z9981 Dependence on supplemental oxygen: Secondary | ICD-10-CM | POA: Diagnosis not present

## 2023-12-30 DIAGNOSIS — F17210 Nicotine dependence, cigarettes, uncomplicated: Secondary | ICD-10-CM | POA: Diagnosis not present

## 2023-12-30 DIAGNOSIS — Z952 Presence of prosthetic heart valve: Secondary | ICD-10-CM | POA: Diagnosis not present

## 2023-12-30 DIAGNOSIS — Z951 Presence of aortocoronary bypass graft: Secondary | ICD-10-CM | POA: Diagnosis not present

## 2023-12-30 DIAGNOSIS — J441 Chronic obstructive pulmonary disease with (acute) exacerbation: Secondary | ICD-10-CM | POA: Diagnosis not present

## 2023-12-30 DIAGNOSIS — L219 Seborrheic dermatitis, unspecified: Secondary | ICD-10-CM | POA: Diagnosis not present

## 2023-12-30 DIAGNOSIS — E1142 Type 2 diabetes mellitus with diabetic polyneuropathy: Secondary | ICD-10-CM | POA: Diagnosis not present

## 2023-12-30 DIAGNOSIS — Z7902 Long term (current) use of antithrombotics/antiplatelets: Secondary | ICD-10-CM | POA: Diagnosis not present

## 2023-12-30 DIAGNOSIS — M1000 Idiopathic gout, unspecified site: Secondary | ICD-10-CM | POA: Diagnosis not present

## 2024-01-01 DIAGNOSIS — E1142 Type 2 diabetes mellitus with diabetic polyneuropathy: Secondary | ICD-10-CM | POA: Diagnosis not present

## 2024-01-01 DIAGNOSIS — L219 Seborrheic dermatitis, unspecified: Secondary | ICD-10-CM | POA: Diagnosis not present

## 2024-01-01 DIAGNOSIS — J441 Chronic obstructive pulmonary disease with (acute) exacerbation: Secondary | ICD-10-CM | POA: Diagnosis not present

## 2024-01-01 DIAGNOSIS — Z7902 Long term (current) use of antithrombotics/antiplatelets: Secondary | ICD-10-CM | POA: Diagnosis not present

## 2024-01-01 DIAGNOSIS — E875 Hyperkalemia: Secondary | ICD-10-CM | POA: Diagnosis not present

## 2024-01-01 DIAGNOSIS — F17210 Nicotine dependence, cigarettes, uncomplicated: Secondary | ICD-10-CM | POA: Diagnosis not present

## 2024-01-01 DIAGNOSIS — Z7982 Long term (current) use of aspirin: Secondary | ICD-10-CM | POA: Diagnosis not present

## 2024-01-01 DIAGNOSIS — I10 Essential (primary) hypertension: Secondary | ICD-10-CM | POA: Diagnosis not present

## 2024-01-01 DIAGNOSIS — E7800 Pure hypercholesterolemia, unspecified: Secondary | ICD-10-CM | POA: Diagnosis not present

## 2024-01-01 DIAGNOSIS — M1000 Idiopathic gout, unspecified site: Secondary | ICD-10-CM | POA: Diagnosis not present

## 2024-01-01 DIAGNOSIS — E039 Hypothyroidism, unspecified: Secondary | ICD-10-CM | POA: Diagnosis not present

## 2024-01-01 DIAGNOSIS — I2510 Atherosclerotic heart disease of native coronary artery without angina pectoris: Secondary | ICD-10-CM | POA: Diagnosis not present

## 2024-01-01 DIAGNOSIS — Z9981 Dependence on supplemental oxygen: Secondary | ICD-10-CM | POA: Diagnosis not present

## 2024-01-01 DIAGNOSIS — Z951 Presence of aortocoronary bypass graft: Secondary | ICD-10-CM | POA: Diagnosis not present

## 2024-01-01 DIAGNOSIS — I459 Conduction disorder, unspecified: Secondary | ICD-10-CM | POA: Diagnosis not present

## 2024-01-01 DIAGNOSIS — Z952 Presence of prosthetic heart valve: Secondary | ICD-10-CM | POA: Diagnosis not present

## 2024-01-03 DIAGNOSIS — M1000 Idiopathic gout, unspecified site: Secondary | ICD-10-CM | POA: Diagnosis not present

## 2024-01-03 DIAGNOSIS — E7800 Pure hypercholesterolemia, unspecified: Secondary | ICD-10-CM | POA: Diagnosis not present

## 2024-01-03 DIAGNOSIS — I2510 Atherosclerotic heart disease of native coronary artery without angina pectoris: Secondary | ICD-10-CM | POA: Diagnosis not present

## 2024-01-03 DIAGNOSIS — Z7982 Long term (current) use of aspirin: Secondary | ICD-10-CM | POA: Diagnosis not present

## 2024-01-03 DIAGNOSIS — I459 Conduction disorder, unspecified: Secondary | ICD-10-CM | POA: Diagnosis not present

## 2024-01-03 DIAGNOSIS — E039 Hypothyroidism, unspecified: Secondary | ICD-10-CM | POA: Diagnosis not present

## 2024-01-03 DIAGNOSIS — I10 Essential (primary) hypertension: Secondary | ICD-10-CM | POA: Diagnosis not present

## 2024-01-03 DIAGNOSIS — Z952 Presence of prosthetic heart valve: Secondary | ICD-10-CM | POA: Diagnosis not present

## 2024-01-03 DIAGNOSIS — L219 Seborrheic dermatitis, unspecified: Secondary | ICD-10-CM | POA: Diagnosis not present

## 2024-01-03 DIAGNOSIS — E1142 Type 2 diabetes mellitus with diabetic polyneuropathy: Secondary | ICD-10-CM | POA: Diagnosis not present

## 2024-01-03 DIAGNOSIS — J441 Chronic obstructive pulmonary disease with (acute) exacerbation: Secondary | ICD-10-CM | POA: Diagnosis not present

## 2024-01-03 DIAGNOSIS — Z7902 Long term (current) use of antithrombotics/antiplatelets: Secondary | ICD-10-CM | POA: Diagnosis not present

## 2024-01-03 DIAGNOSIS — F17210 Nicotine dependence, cigarettes, uncomplicated: Secondary | ICD-10-CM | POA: Diagnosis not present

## 2024-01-03 DIAGNOSIS — Z9981 Dependence on supplemental oxygen: Secondary | ICD-10-CM | POA: Diagnosis not present

## 2024-01-03 DIAGNOSIS — Z951 Presence of aortocoronary bypass graft: Secondary | ICD-10-CM | POA: Diagnosis not present

## 2024-01-03 DIAGNOSIS — E875 Hyperkalemia: Secondary | ICD-10-CM | POA: Diagnosis not present

## 2024-01-04 DIAGNOSIS — Z951 Presence of aortocoronary bypass graft: Secondary | ICD-10-CM | POA: Diagnosis not present

## 2024-01-04 DIAGNOSIS — E7800 Pure hypercholesterolemia, unspecified: Secondary | ICD-10-CM | POA: Diagnosis not present

## 2024-01-04 DIAGNOSIS — I2510 Atherosclerotic heart disease of native coronary artery without angina pectoris: Secondary | ICD-10-CM | POA: Diagnosis not present

## 2024-01-04 DIAGNOSIS — J441 Chronic obstructive pulmonary disease with (acute) exacerbation: Secondary | ICD-10-CM | POA: Diagnosis not present

## 2024-01-04 DIAGNOSIS — I10 Essential (primary) hypertension: Secondary | ICD-10-CM | POA: Diagnosis not present

## 2024-01-04 DIAGNOSIS — F17210 Nicotine dependence, cigarettes, uncomplicated: Secondary | ICD-10-CM | POA: Diagnosis not present

## 2024-01-04 DIAGNOSIS — Z7902 Long term (current) use of antithrombotics/antiplatelets: Secondary | ICD-10-CM | POA: Diagnosis not present

## 2024-01-04 DIAGNOSIS — E1142 Type 2 diabetes mellitus with diabetic polyneuropathy: Secondary | ICD-10-CM | POA: Diagnosis not present

## 2024-01-04 DIAGNOSIS — I459 Conduction disorder, unspecified: Secondary | ICD-10-CM | POA: Diagnosis not present

## 2024-01-04 DIAGNOSIS — Z9981 Dependence on supplemental oxygen: Secondary | ICD-10-CM | POA: Diagnosis not present

## 2024-01-04 DIAGNOSIS — Z7982 Long term (current) use of aspirin: Secondary | ICD-10-CM | POA: Diagnosis not present

## 2024-01-04 DIAGNOSIS — M1000 Idiopathic gout, unspecified site: Secondary | ICD-10-CM | POA: Diagnosis not present

## 2024-01-04 DIAGNOSIS — E039 Hypothyroidism, unspecified: Secondary | ICD-10-CM | POA: Diagnosis not present

## 2024-01-04 DIAGNOSIS — Z952 Presence of prosthetic heart valve: Secondary | ICD-10-CM | POA: Diagnosis not present

## 2024-01-04 DIAGNOSIS — E875 Hyperkalemia: Secondary | ICD-10-CM | POA: Diagnosis not present

## 2024-01-04 DIAGNOSIS — L219 Seborrheic dermatitis, unspecified: Secondary | ICD-10-CM | POA: Diagnosis not present

## 2024-01-10 DIAGNOSIS — E538 Deficiency of other specified B group vitamins: Secondary | ICD-10-CM | POA: Diagnosis not present

## 2024-01-10 DIAGNOSIS — E7800 Pure hypercholesterolemia, unspecified: Secondary | ICD-10-CM | POA: Diagnosis not present

## 2024-01-10 DIAGNOSIS — Z23 Encounter for immunization: Secondary | ICD-10-CM | POA: Diagnosis not present

## 2024-01-10 DIAGNOSIS — Z136 Encounter for screening for cardiovascular disorders: Secondary | ICD-10-CM | POA: Diagnosis not present

## 2024-01-10 DIAGNOSIS — M47816 Spondylosis without myelopathy or radiculopathy, lumbar region: Secondary | ICD-10-CM | POA: Diagnosis not present

## 2024-01-10 DIAGNOSIS — J441 Chronic obstructive pulmonary disease with (acute) exacerbation: Secondary | ICD-10-CM | POA: Diagnosis not present

## 2024-01-10 DIAGNOSIS — I10 Essential (primary) hypertension: Secondary | ICD-10-CM | POA: Diagnosis not present

## 2024-01-10 DIAGNOSIS — R739 Hyperglycemia, unspecified: Secondary | ICD-10-CM | POA: Diagnosis not present

## 2024-01-10 DIAGNOSIS — Z0000 Encounter for general adult medical examination without abnormal findings: Secondary | ICD-10-CM | POA: Diagnosis not present

## 2024-01-10 DIAGNOSIS — I2510 Atherosclerotic heart disease of native coronary artery without angina pectoris: Secondary | ICD-10-CM | POA: Diagnosis not present

## 2024-01-11 DIAGNOSIS — J441 Chronic obstructive pulmonary disease with (acute) exacerbation: Secondary | ICD-10-CM | POA: Diagnosis not present

## 2024-01-11 DIAGNOSIS — Z7982 Long term (current) use of aspirin: Secondary | ICD-10-CM | POA: Diagnosis not present

## 2024-01-11 DIAGNOSIS — I459 Conduction disorder, unspecified: Secondary | ICD-10-CM | POA: Diagnosis not present

## 2024-01-11 DIAGNOSIS — E1142 Type 2 diabetes mellitus with diabetic polyneuropathy: Secondary | ICD-10-CM | POA: Diagnosis not present

## 2024-01-11 DIAGNOSIS — M1000 Idiopathic gout, unspecified site: Secondary | ICD-10-CM | POA: Diagnosis not present

## 2024-01-11 DIAGNOSIS — E039 Hypothyroidism, unspecified: Secondary | ICD-10-CM | POA: Diagnosis not present

## 2024-01-11 DIAGNOSIS — L219 Seborrheic dermatitis, unspecified: Secondary | ICD-10-CM | POA: Diagnosis not present

## 2024-01-11 DIAGNOSIS — I2510 Atherosclerotic heart disease of native coronary artery without angina pectoris: Secondary | ICD-10-CM | POA: Diagnosis not present

## 2024-01-11 DIAGNOSIS — Z9981 Dependence on supplemental oxygen: Secondary | ICD-10-CM | POA: Diagnosis not present

## 2024-01-11 DIAGNOSIS — I10 Essential (primary) hypertension: Secondary | ICD-10-CM | POA: Diagnosis not present

## 2024-01-11 DIAGNOSIS — Z951 Presence of aortocoronary bypass graft: Secondary | ICD-10-CM | POA: Diagnosis not present

## 2024-01-11 DIAGNOSIS — Z7902 Long term (current) use of antithrombotics/antiplatelets: Secondary | ICD-10-CM | POA: Diagnosis not present

## 2024-01-11 DIAGNOSIS — Z952 Presence of prosthetic heart valve: Secondary | ICD-10-CM | POA: Diagnosis not present

## 2024-01-11 DIAGNOSIS — E7800 Pure hypercholesterolemia, unspecified: Secondary | ICD-10-CM | POA: Diagnosis not present

## 2024-01-11 DIAGNOSIS — F17210 Nicotine dependence, cigarettes, uncomplicated: Secondary | ICD-10-CM | POA: Diagnosis not present

## 2024-01-11 DIAGNOSIS — E875 Hyperkalemia: Secondary | ICD-10-CM | POA: Diagnosis not present

## 2024-01-12 DIAGNOSIS — E1142 Type 2 diabetes mellitus with diabetic polyneuropathy: Secondary | ICD-10-CM | POA: Diagnosis not present

## 2024-01-12 DIAGNOSIS — I2510 Atherosclerotic heart disease of native coronary artery without angina pectoris: Secondary | ICD-10-CM | POA: Diagnosis not present

## 2024-01-12 DIAGNOSIS — I459 Conduction disorder, unspecified: Secondary | ICD-10-CM | POA: Diagnosis not present

## 2024-01-12 DIAGNOSIS — I10 Essential (primary) hypertension: Secondary | ICD-10-CM | POA: Diagnosis not present

## 2024-01-12 DIAGNOSIS — E7800 Pure hypercholesterolemia, unspecified: Secondary | ICD-10-CM | POA: Diagnosis not present

## 2024-01-12 DIAGNOSIS — Z7982 Long term (current) use of aspirin: Secondary | ICD-10-CM | POA: Diagnosis not present

## 2024-01-12 DIAGNOSIS — Z952 Presence of prosthetic heart valve: Secondary | ICD-10-CM | POA: Diagnosis not present

## 2024-01-12 DIAGNOSIS — E039 Hypothyroidism, unspecified: Secondary | ICD-10-CM | POA: Diagnosis not present

## 2024-01-12 DIAGNOSIS — Z9981 Dependence on supplemental oxygen: Secondary | ICD-10-CM | POA: Diagnosis not present

## 2024-01-12 DIAGNOSIS — J441 Chronic obstructive pulmonary disease with (acute) exacerbation: Secondary | ICD-10-CM | POA: Diagnosis not present

## 2024-01-12 DIAGNOSIS — Z7902 Long term (current) use of antithrombotics/antiplatelets: Secondary | ICD-10-CM | POA: Diagnosis not present

## 2024-01-12 DIAGNOSIS — Z951 Presence of aortocoronary bypass graft: Secondary | ICD-10-CM | POA: Diagnosis not present

## 2024-01-12 DIAGNOSIS — L219 Seborrheic dermatitis, unspecified: Secondary | ICD-10-CM | POA: Diagnosis not present

## 2024-01-12 DIAGNOSIS — E875 Hyperkalemia: Secondary | ICD-10-CM | POA: Diagnosis not present

## 2024-01-12 DIAGNOSIS — M1000 Idiopathic gout, unspecified site: Secondary | ICD-10-CM | POA: Diagnosis not present

## 2024-01-12 DIAGNOSIS — F17210 Nicotine dependence, cigarettes, uncomplicated: Secondary | ICD-10-CM | POA: Diagnosis not present

## 2024-01-16 DIAGNOSIS — J441 Chronic obstructive pulmonary disease with (acute) exacerbation: Secondary | ICD-10-CM | POA: Diagnosis not present

## 2024-01-17 DIAGNOSIS — L219 Seborrheic dermatitis, unspecified: Secondary | ICD-10-CM | POA: Diagnosis not present

## 2024-01-17 DIAGNOSIS — Z9981 Dependence on supplemental oxygen: Secondary | ICD-10-CM | POA: Diagnosis not present

## 2024-01-17 DIAGNOSIS — I459 Conduction disorder, unspecified: Secondary | ICD-10-CM | POA: Diagnosis not present

## 2024-01-17 DIAGNOSIS — Z952 Presence of prosthetic heart valve: Secondary | ICD-10-CM | POA: Diagnosis not present

## 2024-01-17 DIAGNOSIS — E875 Hyperkalemia: Secondary | ICD-10-CM | POA: Diagnosis not present

## 2024-01-17 DIAGNOSIS — M1000 Idiopathic gout, unspecified site: Secondary | ICD-10-CM | POA: Diagnosis not present

## 2024-01-17 DIAGNOSIS — Z7902 Long term (current) use of antithrombotics/antiplatelets: Secondary | ICD-10-CM | POA: Diagnosis not present

## 2024-01-17 DIAGNOSIS — I2510 Atherosclerotic heart disease of native coronary artery without angina pectoris: Secondary | ICD-10-CM | POA: Diagnosis not present

## 2024-01-17 DIAGNOSIS — E7800 Pure hypercholesterolemia, unspecified: Secondary | ICD-10-CM | POA: Diagnosis not present

## 2024-01-17 DIAGNOSIS — Z951 Presence of aortocoronary bypass graft: Secondary | ICD-10-CM | POA: Diagnosis not present

## 2024-01-17 DIAGNOSIS — E1142 Type 2 diabetes mellitus with diabetic polyneuropathy: Secondary | ICD-10-CM | POA: Diagnosis not present

## 2024-01-17 DIAGNOSIS — I10 Essential (primary) hypertension: Secondary | ICD-10-CM | POA: Diagnosis not present

## 2024-01-17 DIAGNOSIS — J441 Chronic obstructive pulmonary disease with (acute) exacerbation: Secondary | ICD-10-CM | POA: Diagnosis not present

## 2024-01-17 DIAGNOSIS — F17210 Nicotine dependence, cigarettes, uncomplicated: Secondary | ICD-10-CM | POA: Diagnosis not present

## 2024-01-17 DIAGNOSIS — Z7982 Long term (current) use of aspirin: Secondary | ICD-10-CM | POA: Diagnosis not present

## 2024-01-17 DIAGNOSIS — E039 Hypothyroidism, unspecified: Secondary | ICD-10-CM | POA: Diagnosis not present

## 2024-01-19 DIAGNOSIS — Z7982 Long term (current) use of aspirin: Secondary | ICD-10-CM | POA: Diagnosis not present

## 2024-01-19 DIAGNOSIS — Z951 Presence of aortocoronary bypass graft: Secondary | ICD-10-CM | POA: Diagnosis not present

## 2024-01-19 DIAGNOSIS — Z7902 Long term (current) use of antithrombotics/antiplatelets: Secondary | ICD-10-CM | POA: Diagnosis not present

## 2024-01-19 DIAGNOSIS — E1142 Type 2 diabetes mellitus with diabetic polyneuropathy: Secondary | ICD-10-CM | POA: Diagnosis not present

## 2024-01-19 DIAGNOSIS — I2510 Atherosclerotic heart disease of native coronary artery without angina pectoris: Secondary | ICD-10-CM | POA: Diagnosis not present

## 2024-01-19 DIAGNOSIS — Z9981 Dependence on supplemental oxygen: Secondary | ICD-10-CM | POA: Diagnosis not present

## 2024-01-19 DIAGNOSIS — M1000 Idiopathic gout, unspecified site: Secondary | ICD-10-CM | POA: Diagnosis not present

## 2024-01-19 DIAGNOSIS — Z952 Presence of prosthetic heart valve: Secondary | ICD-10-CM | POA: Diagnosis not present

## 2024-01-19 DIAGNOSIS — J441 Chronic obstructive pulmonary disease with (acute) exacerbation: Secondary | ICD-10-CM | POA: Diagnosis not present

## 2024-01-19 DIAGNOSIS — E7800 Pure hypercholesterolemia, unspecified: Secondary | ICD-10-CM | POA: Diagnosis not present

## 2024-01-19 DIAGNOSIS — E039 Hypothyroidism, unspecified: Secondary | ICD-10-CM | POA: Diagnosis not present

## 2024-01-19 DIAGNOSIS — L219 Seborrheic dermatitis, unspecified: Secondary | ICD-10-CM | POA: Diagnosis not present

## 2024-01-19 DIAGNOSIS — I10 Essential (primary) hypertension: Secondary | ICD-10-CM | POA: Diagnosis not present

## 2024-01-19 DIAGNOSIS — F17210 Nicotine dependence, cigarettes, uncomplicated: Secondary | ICD-10-CM | POA: Diagnosis not present

## 2024-01-19 DIAGNOSIS — I459 Conduction disorder, unspecified: Secondary | ICD-10-CM | POA: Diagnosis not present

## 2024-01-19 DIAGNOSIS — E875 Hyperkalemia: Secondary | ICD-10-CM | POA: Diagnosis not present

## 2024-01-22 DIAGNOSIS — E039 Hypothyroidism, unspecified: Secondary | ICD-10-CM | POA: Diagnosis not present

## 2024-01-22 DIAGNOSIS — J441 Chronic obstructive pulmonary disease with (acute) exacerbation: Secondary | ICD-10-CM | POA: Diagnosis not present

## 2024-01-22 DIAGNOSIS — E785 Hyperlipidemia, unspecified: Secondary | ICD-10-CM | POA: Diagnosis not present

## 2024-01-22 DIAGNOSIS — I2510 Atherosclerotic heart disease of native coronary artery without angina pectoris: Secondary | ICD-10-CM | POA: Diagnosis not present

## 2024-01-22 DIAGNOSIS — E119 Type 2 diabetes mellitus without complications: Secondary | ICD-10-CM | POA: Diagnosis not present
